# Patient Record
Sex: Female | Born: 1935 | Race: White | Hispanic: No | Smoking: Former smoker
Health system: Southern US, Community
[De-identification: ages and names within clinical notes are randomized; demographics above are authoritative.]

## PROBLEM LIST (undated history)

## (undated) DIAGNOSIS — Z8619 Personal history of other infectious and parasitic diseases: Secondary | ICD-10-CM

## (undated) DIAGNOSIS — Z889 Allergy status to unspecified drugs, medicaments and biological substances status: Secondary | ICD-10-CM

## (undated) DIAGNOSIS — Z8719 Personal history of other diseases of the digestive system: Secondary | ICD-10-CM

## (undated) DIAGNOSIS — L409 Psoriasis, unspecified: Secondary | ICD-10-CM

## (undated) DIAGNOSIS — J342 Deviated nasal septum: Secondary | ICD-10-CM

## (undated) DIAGNOSIS — L309 Dermatitis, unspecified: Secondary | ICD-10-CM

## (undated) DIAGNOSIS — E538 Deficiency of other specified B group vitamins: Secondary | ICD-10-CM

## (undated) DIAGNOSIS — M1990 Unspecified osteoarthritis, unspecified site: Secondary | ICD-10-CM

## (undated) DIAGNOSIS — K5090 Crohn's disease, unspecified, without complications: Secondary | ICD-10-CM

## (undated) DIAGNOSIS — I10 Essential (primary) hypertension: Secondary | ICD-10-CM

## (undated) DIAGNOSIS — H269 Unspecified cataract: Secondary | ICD-10-CM

## (undated) HISTORY — DX: Personal history of other infectious and parasitic diseases: Z86.19

## (undated) HISTORY — DX: Dermatitis, unspecified: L30.9

## (undated) HISTORY — PX: ABDOMINAL HYSTERECTOMY: SHX81

## (undated) HISTORY — PX: NOSE SURGERY: SHX723

## (undated) HISTORY — PX: OTHER SURGICAL HISTORY: SHX169

## (undated) HISTORY — DX: Crohn's disease, unspecified, without complications: K50.90

## (undated) HISTORY — DX: Personal history of other diseases of the digestive system: Z87.19

## (undated) HISTORY — DX: Unspecified osteoarthritis, unspecified site: M19.90

## (undated) HISTORY — DX: Essential (primary) hypertension: I10

## (undated) HISTORY — PX: EYE SURGERY: SHX253

## (undated) HISTORY — DX: Allergy status to unspecified drugs, medicaments and biological substances: Z88.9

## (undated) HISTORY — DX: Deficiency of other specified B group vitamins: E53.8

## (undated) HISTORY — DX: Unspecified cataract: H26.9

## (undated) HISTORY — PX: BACK SURGERY: SHX140

## (undated) HISTORY — PX: MOHS SURGERY: SHX181

## (undated) HISTORY — DX: Psoriasis, unspecified: L40.9

## (undated) HISTORY — PX: CATARACT EXTRACTION: SUR2

## (undated) HISTORY — PX: CHOLECYSTECTOMY: SHX55

## (undated) HISTORY — PX: NASAL SEPTUM SURGERY: SHX37

## (undated) HISTORY — DX: Deviated nasal septum: J34.2

---

## 1998-06-03 ENCOUNTER — Emergency Department (HOSPITAL_COMMUNITY): Admission: EM | Admit: 1998-06-03 | Discharge: 1998-06-03 | Payer: Self-pay | Admitting: Emergency Medicine

## 1998-08-02 ENCOUNTER — Encounter: Payer: Self-pay | Admitting: General Surgery

## 1998-08-06 ENCOUNTER — Ambulatory Visit (HOSPITAL_COMMUNITY): Admission: RE | Admit: 1998-08-06 | Discharge: 1998-08-06 | Payer: Self-pay | Admitting: General Surgery

## 1999-01-08 ENCOUNTER — Ambulatory Visit (HOSPITAL_BASED_OUTPATIENT_CLINIC_OR_DEPARTMENT_OTHER): Admission: RE | Admit: 1999-01-08 | Discharge: 1999-01-08 | Payer: Self-pay | Admitting: Plastic Surgery

## 1999-03-06 ENCOUNTER — Ambulatory Visit (HOSPITAL_BASED_OUTPATIENT_CLINIC_OR_DEPARTMENT_OTHER): Admission: RE | Admit: 1999-03-06 | Discharge: 1999-03-06 | Payer: Self-pay | Admitting: General Surgery

## 2000-03-03 ENCOUNTER — Other Ambulatory Visit: Admission: RE | Admit: 2000-03-03 | Discharge: 2000-03-03 | Payer: Self-pay | Admitting: Gastroenterology

## 2000-03-03 ENCOUNTER — Encounter (INDEPENDENT_AMBULATORY_CARE_PROVIDER_SITE_OTHER): Payer: Self-pay

## 2001-09-09 ENCOUNTER — Ambulatory Visit (HOSPITAL_BASED_OUTPATIENT_CLINIC_OR_DEPARTMENT_OTHER): Admission: RE | Admit: 2001-09-09 | Discharge: 2001-09-09 | Payer: Self-pay | Admitting: Plastic Surgery

## 2001-09-09 ENCOUNTER — Encounter (INDEPENDENT_AMBULATORY_CARE_PROVIDER_SITE_OTHER): Payer: Self-pay | Admitting: Specialist

## 2002-06-20 ENCOUNTER — Encounter: Payer: Self-pay | Admitting: Neurosurgery

## 2002-06-22 ENCOUNTER — Inpatient Hospital Stay (HOSPITAL_COMMUNITY): Admission: RE | Admit: 2002-06-22 | Discharge: 2002-06-24 | Payer: Self-pay | Admitting: Neurosurgery

## 2002-06-22 ENCOUNTER — Encounter (INDEPENDENT_AMBULATORY_CARE_PROVIDER_SITE_OTHER): Payer: Self-pay | Admitting: Specialist

## 2002-06-22 ENCOUNTER — Encounter: Payer: Self-pay | Admitting: Neurosurgery

## 2002-09-29 HISTORY — PX: NECK SURGERY: SHX720

## 2004-07-08 ENCOUNTER — Ambulatory Visit: Payer: Self-pay | Admitting: Family Medicine

## 2004-12-30 ENCOUNTER — Ambulatory Visit: Payer: Self-pay | Admitting: Family Medicine

## 2005-02-20 ENCOUNTER — Ambulatory Visit: Payer: Self-pay | Admitting: Gastroenterology

## 2005-03-11 ENCOUNTER — Ambulatory Visit: Payer: Self-pay | Admitting: Gastroenterology

## 2005-03-26 ENCOUNTER — Ambulatory Visit: Payer: Self-pay | Admitting: Gastroenterology

## 2005-06-17 ENCOUNTER — Ambulatory Visit: Payer: Self-pay | Admitting: Family Medicine

## 2005-10-27 ENCOUNTER — Ambulatory Visit: Payer: Self-pay | Admitting: Gastroenterology

## 2005-11-07 ENCOUNTER — Ambulatory Visit: Payer: Self-pay | Admitting: Gastroenterology

## 2005-11-10 ENCOUNTER — Ambulatory Visit: Payer: Self-pay | Admitting: Gastroenterology

## 2006-02-18 ENCOUNTER — Emergency Department: Payer: Self-pay | Admitting: Emergency Medicine

## 2006-02-18 ENCOUNTER — Other Ambulatory Visit: Payer: Self-pay

## 2006-04-17 ENCOUNTER — Ambulatory Visit: Payer: Self-pay | Admitting: Gastroenterology

## 2006-06-19 ENCOUNTER — Ambulatory Visit: Payer: Self-pay | Admitting: Family Medicine

## 2007-04-30 ENCOUNTER — Ambulatory Visit: Payer: Self-pay | Admitting: Gastroenterology

## 2007-04-30 LAB — CONVERTED CEMR LAB
ALT: 25 units/L (ref 0–35)
AST: 22 units/L (ref 0–37)
Albumin: 4.1 g/dL (ref 3.5–5.2)
Alkaline Phosphatase: 57 units/L (ref 39–117)
BUN: 22 mg/dL (ref 6–23)
Basophils Absolute: 0 10*3/uL (ref 0.0–0.1)
Basophils Relative: 0.5 % (ref 0.0–1.0)
Bilirubin, Direct: 0.1 mg/dL (ref 0.0–0.3)
CO2: 34 meq/L — ABNORMAL HIGH (ref 19–32)
Calcium: 9.5 mg/dL (ref 8.4–10.5)
Chloride: 93 meq/L — ABNORMAL LOW (ref 96–112)
Creatinine, Ser: 0.9 mg/dL (ref 0.4–1.2)
Eosinophils Absolute: 0.2 10*3/uL (ref 0.0–0.6)
Eosinophils Relative: 2.8 % (ref 0.0–5.0)
GFR calc Af Amer: 77 mL/min
GFR calc non Af Amer: 64 mL/min
Glucose, Bld: 103 mg/dL — ABNORMAL HIGH (ref 70–99)
HCT: 40.2 % (ref 36.0–46.0)
Hemoglobin: 14.1 g/dL (ref 12.0–15.0)
Lymphocytes Relative: 20.7 % (ref 12.0–46.0)
MCHC: 35 g/dL (ref 30.0–36.0)
MCV: 91.9 fL (ref 78.0–100.0)
Monocytes Absolute: 0.8 10*3/uL — ABNORMAL HIGH (ref 0.2–0.7)
Monocytes Relative: 14.5 % — ABNORMAL HIGH (ref 3.0–11.0)
Neutro Abs: 3.3 10*3/uL (ref 1.4–7.7)
Neutrophils Relative %: 61.5 % (ref 43.0–77.0)
Platelets: 256 10*3/uL (ref 150–400)
Potassium: 4.3 meq/L (ref 3.5–5.1)
RBC: 4.38 M/uL (ref 3.87–5.11)
RDW: 12.7 % (ref 11.5–14.6)
Sodium: 132 meq/L — ABNORMAL LOW (ref 135–145)
Total Bilirubin: 1.1 mg/dL (ref 0.3–1.2)
Total Protein: 7.3 g/dL (ref 6.0–8.3)
WBC: 5.4 10*3/uL (ref 4.5–10.5)

## 2007-06-11 ENCOUNTER — Encounter: Payer: Self-pay | Admitting: Gastroenterology

## 2007-06-11 ENCOUNTER — Ambulatory Visit: Payer: Self-pay | Admitting: Gastroenterology

## 2007-08-03 ENCOUNTER — Ambulatory Visit: Payer: Self-pay | Admitting: Gastroenterology

## 2007-08-03 LAB — CONVERTED CEMR LAB
ALT: 23 units/L (ref 0–35)
AST: 20 units/L (ref 0–37)
Albumin: 4 g/dL (ref 3.5–5.2)
Alkaline Phosphatase: 53 units/L (ref 39–117)
BUN: 15 mg/dL (ref 6–23)
Basophils Absolute: 0.1 10*3/uL (ref 0.0–0.1)
Basophils Relative: 1.1 % — ABNORMAL HIGH (ref 0.0–1.0)
Bilirubin, Direct: 0.1 mg/dL (ref 0.0–0.3)
CO2: 31 meq/L (ref 19–32)
Calcium: 9.6 mg/dL (ref 8.4–10.5)
Chloride: 89 meq/L — ABNORMAL LOW (ref 96–112)
Creatinine, Ser: 0.8 mg/dL (ref 0.4–1.2)
Eosinophils Absolute: 0.1 10*3/uL (ref 0.0–0.6)
Eosinophils Relative: 2.1 % (ref 0.0–5.0)
GFR calc Af Amer: 88 mL/min
GFR calc non Af Amer: 73 mL/min
Glucose, Bld: 122 mg/dL — ABNORMAL HIGH (ref 70–99)
HCT: 41.7 % (ref 36.0–46.0)
Hemoglobin: 14.5 g/dL (ref 12.0–15.0)
Lymphocytes Relative: 14.8 % (ref 12.0–46.0)
MCHC: 34.8 g/dL (ref 30.0–36.0)
MCV: 94.8 fL (ref 78.0–100.0)
Monocytes Absolute: 0.8 10*3/uL — ABNORMAL HIGH (ref 0.2–0.7)
Monocytes Relative: 13.5 % — ABNORMAL HIGH (ref 3.0–11.0)
Neutro Abs: 3.9 10*3/uL (ref 1.4–7.7)
Neutrophils Relative %: 68.5 % (ref 43.0–77.0)
Platelets: 143 10*3/uL — ABNORMAL LOW (ref 150–400)
Potassium: 4.1 meq/L (ref 3.5–5.1)
RBC: 4.4 M/uL (ref 3.87–5.11)
RDW: 12.7 % (ref 11.5–14.6)
Sodium: 127 meq/L — ABNORMAL LOW (ref 135–145)
Total Bilirubin: 0.9 mg/dL (ref 0.3–1.2)
Total Protein: 7.5 g/dL (ref 6.0–8.3)
WBC: 5.8 10*3/uL (ref 4.5–10.5)

## 2007-08-23 ENCOUNTER — Inpatient Hospital Stay: Payer: Self-pay | Admitting: Specialist

## 2007-08-23 ENCOUNTER — Other Ambulatory Visit: Payer: Self-pay

## 2007-09-15 ENCOUNTER — Ambulatory Visit: Payer: Self-pay | Admitting: Family Medicine

## 2007-10-27 ENCOUNTER — Ambulatory Visit: Payer: Self-pay | Admitting: Gastroenterology

## 2007-10-27 LAB — CONVERTED CEMR LAB
ALT: 63 units/L — ABNORMAL HIGH (ref 0–35)
AST: 34 units/L (ref 0–37)
Albumin: 3.6 g/dL (ref 3.5–5.2)
Alkaline Phosphatase: 65 units/L (ref 39–117)
BUN: 14 mg/dL (ref 6–23)
Basophils Absolute: 0 10*3/uL (ref 0.0–0.1)
Basophils Relative: 0.7 % (ref 0.0–1.0)
Bilirubin, Direct: 0.2 mg/dL (ref 0.0–0.3)
CO2: 32 meq/L (ref 19–32)
Calcium: 9.2 mg/dL (ref 8.4–10.5)
Chloride: 97 meq/L (ref 96–112)
Creatinine, Ser: 0.6 mg/dL (ref 0.4–1.2)
Eosinophils Absolute: 0.2 10*3/uL (ref 0.0–0.6)
Eosinophils Relative: 4.1 % (ref 0.0–5.0)
GFR calc Af Amer: 123 mL/min
GFR calc non Af Amer: 102 mL/min
Glucose, Bld: 150 mg/dL — ABNORMAL HIGH (ref 70–99)
HCT: 39.9 % (ref 36.0–46.0)
Hemoglobin: 13.6 g/dL (ref 12.0–15.0)
Lymphocytes Relative: 18.2 % (ref 12.0–46.0)
MCHC: 34.2 g/dL (ref 30.0–36.0)
MCV: 93.2 fL (ref 78.0–100.0)
Monocytes Absolute: 0.5 10*3/uL (ref 0.2–0.7)
Monocytes Relative: 13 % — ABNORMAL HIGH (ref 3.0–11.0)
Neutro Abs: 2.7 10*3/uL (ref 1.4–7.7)
Neutrophils Relative %: 64 % (ref 43.0–77.0)
Platelets: 207 10*3/uL (ref 150–400)
Potassium: 3.7 meq/L (ref 3.5–5.1)
RBC: 4.28 M/uL (ref 3.87–5.11)
RDW: 12.3 % (ref 11.5–14.6)
Sodium: 136 meq/L (ref 135–145)
Total Bilirubin: 1.1 mg/dL (ref 0.3–1.2)
Total Protein: 7 g/dL (ref 6.0–8.3)
WBC: 4.2 10*3/uL — ABNORMAL LOW (ref 4.5–10.5)

## 2007-11-22 DIAGNOSIS — I10 Essential (primary) hypertension: Secondary | ICD-10-CM

## 2007-11-22 DIAGNOSIS — D126 Benign neoplasm of colon, unspecified: Secondary | ICD-10-CM

## 2007-11-22 DIAGNOSIS — K5732 Diverticulitis of large intestine without perforation or abscess without bleeding: Secondary | ICD-10-CM

## 2007-11-22 DIAGNOSIS — M159 Polyosteoarthritis, unspecified: Secondary | ICD-10-CM | POA: Insufficient documentation

## 2007-11-22 DIAGNOSIS — K5090 Crohn's disease, unspecified, without complications: Secondary | ICD-10-CM

## 2007-11-22 DIAGNOSIS — Z85828 Personal history of other malignant neoplasm of skin: Secondary | ICD-10-CM

## 2007-12-02 ENCOUNTER — Ambulatory Visit: Payer: Self-pay | Admitting: Gastroenterology

## 2007-12-02 LAB — CONVERTED CEMR LAB
Basophils Absolute: 0 10*3/uL (ref 0.0–0.1)
Basophils Relative: 0.5 % (ref 0.0–1.0)
Eosinophils Absolute: 0.2 10*3/uL (ref 0.0–0.6)
Eosinophils Relative: 2.7 % (ref 0.0–5.0)
HCT: 41.6 % (ref 36.0–46.0)
Hemoglobin: 14 g/dL (ref 12.0–15.0)
Lymphocytes Relative: 17.2 % (ref 12.0–46.0)
MCHC: 33.8 g/dL (ref 30.0–36.0)
MCV: 93.4 fL (ref 78.0–100.0)
Monocytes Absolute: 0.6 10*3/uL (ref 0.2–0.7)
Monocytes Relative: 9.4 % (ref 3.0–11.0)
Neutro Abs: 4.3 10*3/uL (ref 1.4–7.7)
Neutrophils Relative %: 70.2 % (ref 43.0–77.0)
Platelets: 211 10*3/uL (ref 150–400)
RBC: 4.45 M/uL (ref 3.87–5.11)
RDW: 12.6 % (ref 11.5–14.6)
WBC: 6.1 10*3/uL (ref 4.5–10.5)

## 2008-03-14 ENCOUNTER — Telehealth: Payer: Self-pay | Admitting: Gastroenterology

## 2008-03-24 ENCOUNTER — Ambulatory Visit: Payer: Self-pay | Admitting: Gastroenterology

## 2008-03-24 DIAGNOSIS — K644 Residual hemorrhoidal skin tags: Secondary | ICD-10-CM | POA: Insufficient documentation

## 2008-03-29 ENCOUNTER — Encounter: Payer: Self-pay | Admitting: Gastroenterology

## 2008-03-29 LAB — CONVERTED CEMR LAB
Basophils Absolute: 0.1 10*3/uL (ref 0.0–0.1)
Basophils Relative: 1.3 % — ABNORMAL HIGH (ref 0.0–1.0)
Eosinophils Absolute: 0.1 10*3/uL (ref 0.0–0.7)
Eosinophils Relative: 2.4 % (ref 0.0–5.0)
HCT: 42.7 % (ref 36.0–46.0)
Hemoglobin: 14.9 g/dL (ref 12.0–15.0)
Lymphocytes Relative: 15.3 % (ref 12.0–46.0)
MCHC: 34.8 g/dL (ref 30.0–36.0)
MCV: 93.7 fL (ref 78.0–100.0)
Monocytes Absolute: 0.6 10*3/uL (ref 0.1–1.0)
Monocytes Relative: 11.8 % (ref 3.0–12.0)
Neutro Abs: 3.5 10*3/uL (ref 1.4–7.7)
Neutrophils Relative %: 69.2 % (ref 43.0–77.0)
Platelets: 196 10*3/uL (ref 150–400)
RBC: 4.56 M/uL (ref 3.87–5.11)
RDW: 12.7 % (ref 11.5–14.6)
WBC: 5.1 10*3/uL (ref 4.5–10.5)

## 2008-04-03 ENCOUNTER — Telehealth: Payer: Self-pay | Admitting: Gastroenterology

## 2008-04-17 ENCOUNTER — Ambulatory Visit: Payer: Self-pay | Admitting: Gastroenterology

## 2008-07-19 ENCOUNTER — Ambulatory Visit: Payer: Self-pay | Admitting: Gastroenterology

## 2008-07-20 LAB — CONVERTED CEMR LAB
Basophils Relative: 0.1 % (ref 0.0–3.0)
Eosinophils Relative: 6.3 % — ABNORMAL HIGH (ref 0.0–5.0)
Hemoglobin: 14.8 g/dL (ref 12.0–15.0)
Lymphocytes Relative: 19.7 % (ref 12.0–46.0)
Neutrophils Relative %: 63.6 % (ref 43.0–77.0)
RBC: 4.51 M/uL (ref 3.87–5.11)
WBC: 5.2 10*3/uL (ref 4.5–10.5)

## 2008-10-05 ENCOUNTER — Ambulatory Visit: Payer: Self-pay | Admitting: Family Medicine

## 2008-10-25 ENCOUNTER — Ambulatory Visit: Payer: Self-pay | Admitting: Gastroenterology

## 2008-10-26 LAB — CONVERTED CEMR LAB
Albumin: 3.7 g/dL (ref 3.5–5.2)
Alkaline Phosphatase: 51 units/L (ref 39–117)
CO2: 35 meq/L — ABNORMAL HIGH (ref 19–32)
Calcium: 9.1 mg/dL (ref 8.4–10.5)
Chloride: 99 meq/L (ref 96–112)
GFR calc Af Amer: 88 mL/min
GFR calc non Af Amer: 73 mL/min
Glucose, Bld: 115 mg/dL — ABNORMAL HIGH (ref 70–99)
Lymphocytes Relative: 22.6 % (ref 12.0–46.0)
Monocytes Relative: 11.1 % (ref 3.0–12.0)
Neutrophils Relative %: 63.3 % (ref 43.0–77.0)
Platelets: 191 10*3/uL (ref 150–400)
Potassium: 3.8 meq/L (ref 3.5–5.1)
RDW: 13 % (ref 11.5–14.6)
Sodium: 139 meq/L (ref 135–145)
Total Protein: 7.1 g/dL (ref 6.0–8.3)
WBC: 5.3 10*3/uL (ref 4.5–10.5)

## 2008-11-10 ENCOUNTER — Ambulatory Visit: Payer: Self-pay | Admitting: Gastroenterology

## 2009-01-18 ENCOUNTER — Ambulatory Visit: Payer: Self-pay | Admitting: Gastroenterology

## 2009-01-22 LAB — CONVERTED CEMR LAB
Albumin: 3.7 g/dL (ref 3.5–5.2)
Alkaline Phosphatase: 59 units/L (ref 39–117)
BUN: 15 mg/dL (ref 6–23)
Calcium: 9 mg/dL (ref 8.4–10.5)
Glucose, Bld: 153 mg/dL — ABNORMAL HIGH (ref 70–99)
HCT: 42.6 % (ref 36.0–46.0)
Monocytes Relative: 11.1 % (ref 3.0–12.0)
Platelets: 268 10*3/uL (ref 150.0–400.0)
Potassium: 3.5 meq/L (ref 3.5–5.1)
RDW: 12.6 % (ref 11.5–14.6)

## 2009-11-08 ENCOUNTER — Ambulatory Visit: Payer: Self-pay | Admitting: Family Medicine

## 2009-12-13 ENCOUNTER — Ambulatory Visit: Payer: Self-pay | Admitting: Ophthalmology

## 2009-12-26 ENCOUNTER — Ambulatory Visit: Payer: Self-pay | Admitting: Ophthalmology

## 2010-04-29 ENCOUNTER — Ambulatory Visit: Payer: Self-pay | Admitting: Gastroenterology

## 2010-04-30 LAB — CONVERTED CEMR LAB
Albumin: 3.8 g/dL (ref 3.5–5.2)
BUN: 16 mg/dL (ref 6–23)
Basophils Relative: 1.1 % (ref 0.0–3.0)
Calcium: 9 mg/dL (ref 8.4–10.5)
Chloride: 98 meq/L (ref 96–112)
Eosinophils Relative: 3.9 % (ref 0.0–5.0)
Glucose, Bld: 120 mg/dL — ABNORMAL HIGH (ref 70–99)
Lymphocytes Relative: 16.9 % (ref 12.0–46.0)
Neutrophils Relative %: 67.2 % (ref 43.0–77.0)
Potassium: 4.3 meq/L (ref 3.5–5.1)
RBC: 4.1 M/uL (ref 3.87–5.11)
WBC: 4.8 10*3/uL (ref 4.5–10.5)

## 2010-05-03 ENCOUNTER — Ambulatory Visit: Payer: Self-pay | Admitting: Gastroenterology

## 2010-05-06 ENCOUNTER — Encounter: Payer: Self-pay | Admitting: Gastroenterology

## 2010-05-07 ENCOUNTER — Encounter: Payer: Self-pay | Admitting: Gastroenterology

## 2010-05-08 ENCOUNTER — Encounter: Payer: Self-pay | Admitting: Gastroenterology

## 2010-05-22 ENCOUNTER — Telehealth: Payer: Self-pay | Admitting: Gastroenterology

## 2010-05-27 ENCOUNTER — Ambulatory Visit: Payer: Self-pay | Admitting: Gastroenterology

## 2010-05-29 LAB — CONVERTED CEMR LAB
AST: 41 units/L — ABNORMAL HIGH (ref 0–37)
Albumin: 3.7 g/dL (ref 3.5–5.2)
Alkaline Phosphatase: 94 units/L (ref 39–117)
BUN: 16 mg/dL (ref 6–23)
Basophils Relative: 0.9 % (ref 0.0–3.0)
Eosinophils Absolute: 0.2 10*3/uL (ref 0.0–0.7)
HCT: 40.8 % (ref 36.0–46.0)
Lymphs Abs: 1 10*3/uL (ref 0.7–4.0)
MCHC: 34.6 g/dL (ref 30.0–36.0)
MCV: 97.2 fL (ref 78.0–100.0)
Monocytes Absolute: 0.6 10*3/uL (ref 0.1–1.0)
Neutrophils Relative %: 65.1 % (ref 43.0–77.0)
Potassium: 4.1 meq/L (ref 3.5–5.1)
RBC: 4.19 M/uL (ref 3.87–5.11)
Sed Rate: 23 mm/hr — ABNORMAL HIGH (ref 0–22)
Sodium: 141 meq/L (ref 135–145)
Total Protein: 6.8 g/dL (ref 6.0–8.3)

## 2010-06-12 ENCOUNTER — Ambulatory Visit: Payer: Self-pay | Admitting: Gastroenterology

## 2010-06-12 LAB — CONVERTED CEMR LAB
BUN: 18 mg/dL (ref 6–23)
CO2: 33 meq/L — ABNORMAL HIGH (ref 19–32)
Calcium: 9.6 mg/dL (ref 8.4–10.5)
Creatinine, Ser: 0.6 mg/dL (ref 0.4–1.2)
GFR calc non Af Amer: 105.05 mL/min (ref >60)
Glucose, Bld: 89 mg/dL (ref 70–99)
Total Bilirubin: 1 mg/dL (ref 0.3–1.2)

## 2010-06-25 ENCOUNTER — Telehealth: Payer: Self-pay | Admitting: Gastroenterology

## 2010-07-09 ENCOUNTER — Telehealth: Payer: Self-pay | Admitting: Gastroenterology

## 2010-07-22 ENCOUNTER — Ambulatory Visit: Payer: Self-pay | Admitting: Gastroenterology

## 2010-10-29 ENCOUNTER — Ambulatory Visit
Admission: RE | Admit: 2010-10-29 | Discharge: 2010-10-29 | Payer: Self-pay | Source: Home / Self Care | Attending: Gastroenterology | Admitting: Gastroenterology

## 2010-10-29 ENCOUNTER — Other Ambulatory Visit: Payer: Self-pay | Admitting: Gastroenterology

## 2010-10-29 LAB — COMPREHENSIVE METABOLIC PANEL
ALT: 108 U/L — ABNORMAL HIGH (ref 0–35)
Albumin: 4 g/dL (ref 3.5–5.2)
CO2: 34 mEq/L — ABNORMAL HIGH (ref 19–32)
Chloride: 97 mEq/L (ref 96–112)
GFR: 53.5 mL/min — ABNORMAL LOW (ref >60.00)
Glucose, Bld: 123 mg/dL — ABNORMAL HIGH (ref 70–99)
Potassium: 4.3 mEq/L (ref 3.5–5.1)
Sodium: 138 mEq/L (ref 135–145)
Total Bilirubin: 0.7 mg/dL (ref 0.3–1.2)
Total Protein: 7.2 g/dL (ref 6.0–8.3)

## 2010-10-29 LAB — CBC WITH DIFFERENTIAL/PLATELET
Basophils Absolute: 0 10*3/uL (ref 0.0–0.1)
Eosinophils Relative: 2.4 % (ref 0.0–5.0)
HCT: 43.5 % (ref 36.0–46.0)
Lymphocytes Relative: 13.3 % (ref 12.0–46.0)
Lymphs Abs: 0.8 10*3/uL (ref 0.7–4.0)
Monocytes Relative: 10.5 % (ref 3.0–12.0)
Neutrophils Relative %: 73.4 % (ref 43.0–77.0)
Platelets: 194 10*3/uL (ref 150.0–400.0)
RDW: 13.2 % (ref 11.5–14.6)
WBC: 7.2 10*3/uL (ref 4.5–10.5)

## 2010-10-29 NOTE — Progress Notes (Signed)
Summary: condt. update  Phone Note Call from Patient Call back at Home Phone [phone]   Caller: Patient Call For: Dr Ardis Hughs Reason for Call: Talk to Nurse Summary of Call: Patient wants to speak to nurse regarding condt. uodate. Initial call taken by: Ronalee Red,  June 25, 2010 1:46 PM  Follow-up for Phone Call        pt calling to reprt that she is feeling better and the Entocort and the gas ex is helping a great deal.  She will call if her symptoms worsen or she needs [place] for anything. Follow-up by: Christian Mate CMA Deborra Medina),  June 25, 2010 2:02 PM

## 2010-10-29 NOTE — Progress Notes (Signed)
Summary: med concern  Phone Note Call from Patient Call back at Home Phone [phone]   Caller: Patient Call For: Dr. Ardis Hughs Reason for Call: Talk to Nurse Summary of Call: would like a cheaper med than the generic Entocort... [facility] on [address] in [place] Initial call taken by: Lucien Mons,  July 09, 2010 10:41 AM  Follow-up for Phone Call        left message on machine to call back [place] ([place])  July 09, 2010 1:29 PM   Pt can not afford to take the Entocort it is $500 she only has 5 left.  Is there anything else she can take?  The drug company will not help because she has medicare. Follow-up by: Christian Mate CMA Deborra Medina),  July 09, 2010 2:14 PM  Additional Follow-up for Phone Call Additional follow up Details #1::        ok, lets try low dose prednisone.  have her take 29m pills, one pill, once daily.  disp 60 with one refill.  Additional Follow-up by: DMilus BanisterMD,  July 10, 2010 7:46 AM    Additional Follow-up for Phone Call Additional follow up Details #2::    pt aware med sent to pharmacy Follow-up by: PChristian MateCMA (Deborra Medina,  July 10, 2010 8:55 AM  New/Updated Medications: PREDNISONE 10 MG TABS (PREDNISONE) 1 by mouth once daily Prescriptions: PREDNISONE 10 MG TABS (PREDNISONE) 1 by mouth once daily  #60 x 1   Entered by:   PChristian MateCMA (AMadera   Authorized by:   DMilus BanisterMD   Signed by:   PChristian MateCMA (ARoy on 07/10/2010   Method used:   Electronically to        [facility])       [address]      [place]  [address]      Ph: [phone]      Fax: [phone]  RxID:   [id number]

## 2010-10-29 NOTE — Progress Notes (Signed)
Summary: Triage  Phone Note Call from Patient Call back at Home Phone [phone]   Caller: Patient Call For: Dr. Ardis Hughs Reason for Call: Talk to Nurse Summary of Call: Having a change in her BM....extreme diarrhea Initial call taken by: Webb Laws,  May 22, 2010 9:15 AM  Follow-up for Phone Call        pt is calling because her diarrhea has not increased but is looser.  She has not tried Immodium.  She has no other symptoms, no rectal bleeding, fever or abd pain just very loose stools several times per day.  I advised her to take her Immodium and I call back if this does not help.  She does have a f/u appt with Dr Ardis Hughs in a few weeks  and will keep that.  I will forward to Dr Ardis Hughs for any further orders. Follow-up by: Christian Mate CMA Deborra Medina),  May 22, 2010 9:52 AM  Additional Follow-up for Phone Call Additional follow up Details #1::        i agree, can you have her come in in next few days for the labs she was going to have done prior to her next rov (cbc, cmet) Additional Follow-up by: Milus Banister MD,  May 22, 2010 12:45 PM    Additional Follow-up for Phone Call Additional follow up Details #2::    pt aware Follow-up by: Christian Mate CMA Deborra Medina),  May 22, 2010 1:30 PM

## 2010-10-29 NOTE — Assessment & Plan Note (Signed)
GI PROBLEM LIST: 1. Crohn's disease, status post 1988 ileocecostomy.  Long term patient of Dr. Inocente Salles [place]'s. Crohn's colitis based on review of records.  Maintained recently on 6MP 75 a day, Asacol 1.2 g a day.  Colonoscopy September 2008 by me was normal.  No colitis.  No ileitis.  Flare of diarrhea starting August, 2011, increased mercaptopurine to 100 mg a day however back down when liver tests bumped significantly. 3. External hemorrhoids, June 2009, causing anal rectal discomfort. Improved with conservative therapies.   History of Present Illness Visit Type: Follow-up Visit Primary GI MD: Owens Loffler MD Primary Provider: Maryland Pink, MD Requesting Provider: na Chief Complaint: four week follow up  History of Present Illness:     very pleasant 77 year old woman whom I last saw about 5 weeks ago. She was having loose stools, felt possibly to be a Crohn's flare. I increased her mercaptopurine up to 100 mg a day however had to back down to 7m a day of 636mdo to increasing liver tests..  Her loose stools continue, has been on immodium (up to 5/day).  multiple stool tests done in the interim has shown no sign of infection. She is having 4-7 loose stools a day, intermittently getting up at night, some urgency as well.           Current Medications (verified): 1)  Caltrate 600+d 600-400 Mg-Unit  Tabs (Calcium Carbonate-Vitamin D) .... Once Daily 2)  Mercaptopurine 50 Mg  Tabs (Mercaptopurine) ...Marland Kitchen 1 1/2 By Mouth Daily 3)  Metoprolol Succinate 50 Mg  Tb24 (Metoprolol Succinate) .... Two Tablets By Mouth Once Daily 4)  Multivitamins   Tabs (Multiple Vitamin) .... Once Daily 5)  Diphenoxylate-Atropine 2.5-0.025 Mg  Tabs (Diphenoxylate-Atropine) .... Take 2 Tab Four Times A Day As Needed 6)  Twynsta 80-5 Mg Tabs (Telmisartan-Amlodipine) .... One Tablet By Mouth Once Daily 7)  Omega-3 350 Mg Caps (Omega-3 Fatty Acids) .... One Tablet By Mouth Two Times A Day  Allergies (verified): 1)   ! Nsaids 2)  ! Sulfa  Vital Signs:  Patient profile:   [age]ear old female Height:      64 inches Weight:      127 pounds BMI:     21.88 BSA:     1.61 Pulse rate:   76 / minute Pulse rhythm:   regular BP sitting:   142 / 86  (left arm) Cuff size:   regular  Vitals Entered By: KeHope PigeonMA (June 12, 2010 1:55 PM)  Physical Exam  Additional Exam:  Constitutional: generally well appearing Psychiatric: alert and oriented times 3 Abdomen: soft, non-tender, non-distended, normal bowel sounds    Impression & Recommendations:  Problem # 1:  Diarrhea, loose stools likely still this is a Crohn's flare. I will start her on 3 pills of Entocort once daily which she'll take for the next month and she'll return to see me at that time. She had labs drawn just prior to this appointment which we will review. Hopefully her liver tests are improving. She knows to call sooner if her diarrhea is not improving or if it worsens.  Patient Instructions: 1)  Will start entocort (take 3 pills, once every morning). Stay on this dose until you see Dr. JaArdis Hughsn 4 weeks. 2)  Return to see Dr. JaArdis Hughsn 4 weeks. 3)  The medication list was reviewed and reconciled.  All changed / newly prescribed medications were explained.  A complete medication list was provided to the patient /  caregiver. Prescriptions: ENTOCORT EC 3 MG  CP24 (BUDESONIDE) Take 3 capsules daily  #90 x 2   Entered and Authorized by:   Sabrina Banister MD   Signed by:   Sabrina Banister MD on 06/12/2010   Method used:   Electronically to        [id number] (retail)       [address] [place], [place]  [address]       Ph: [phone]       Fax: [phone]   RxID:   [id number]   Appended Document:  please call, liver tests much improved.  Should continue with the suggestions outlined at recent visit.  Appended Document:  pt aware

## 2010-10-29 NOTE — Assessment & Plan Note (Signed)
Summary: 57mfu/// em  GI PROBLEM LIST: 1. Crohn's disease, status post 1988 ileocecostomy.  Long term patient of [facility]. Crohn's colitis based on review of records.  Maintained recently on 6MP 75 a day, Asacol 1.2 g a day.  Colonoscopy September 2008 by me was normal.  No colitis.  No ileitis. 3. External hemorrhoids, June 2009, causing anal rectal discomfort. Improved with conservative therapies.   History of Present Illness Primary GI MD: DOwens LofflerMD Primary Provider: JMaryland Pink MD Chief Complaint: 6 month f/u Crohn's disease, pt has some diarrhea for the last several months.  History of Present Illness:     very pleasant 77year old woman who I last saw 6 months ago.  She had a CBC, complete metabolic profile checked 2 days ago and this showed her CBC was normal, she had slightly elevated single transaminase.  Lately she has had more loose stools than usual.  She takes lomotil usually two pills a day, sometimes more.  Will also take an immodium periodically.  She would go usually 8-10 a day without lomotil/immodium.  [place] in [place] was visiting and broke her hip and so needed to stay here for 5 weeks instead of the usual 1 week.    Two neighbors have died recently.  No antibiotics in past few months.  Sometimes nocturnal symptoms.           Current Medications (verified): 1)  Caltrate 600+d 600-400 Mg-Unit  Tabs (Calcium Carbonate-Vitamin D) .... Once Daily 2)  Mercaptopurine 50 Mg  Tabs (Mercaptopurine) .... Take 1 1/2 Daily 3)  Metoprolol Succinate 50 Mg  Tb24 (Metoprolol Succinate) .... Two Tablets By Mouth Once Daily 4)  Multivitamins   Tabs (Multiple Vitamin) .... Once Daily 5)  Amlodipine Besylate 5 Mg  Tabs (Amlodipine Besylate) .... Once Daily 6)  Diphenoxylate-Atropine 2.5-0.025 Mg  Tabs (Diphenoxylate-Atropine) .... Take 2 Tab Four Times A Day As Needed 7)  Clonidine Hcl 0.1 Mg Tabs (Clonidine Hcl) ..Marland Kitchen. 1 Tablet By Mouth Once  Daily 8)  Twynsta 80-5 Mg Tabs (Telmisartan-Amlodipine) .... One Tablet By Mouth Once Daily  Allergies (verified): 1)  ! Nsaids  Vital Signs:  Patient profile:   77year old female Height:      64 inches Weight:      126.25 pounds BMI:     21.75 Pulse rate:   80 / minute Pulse rhythm:   regular BP sitting:   162 / 76  (right arm) Cuff size:   regular  Vitals Entered By: AMarlon PelCMA (Deborra Medina (May 03, 2010 11:05 AM)  Physical Exam  Additional Exam:  Constitutional: generally well appearing Psychiatric: alert and oriented times 3 Abdomen: soft, non-tender, non-distended, normal bowel sounds    Impression & Recommendations:  Problem # 1:  Crohn's disease, recently looser than usual stools she has always down play her loose stools, diarrhea however now she is complaining and so I suspect she truly is having a lot of loose stools, much more than her usual. This may be a Crohn's flare. She feels that it probably is. I will increase her mercaptopurine to 100 mg a day as well as check stool tests for infectious etiology. She will return to see me in 3-4 weeks and sooner if needed.  Other Orders: T-Culture, C-Diff Toxin A/B ([id number] T-Culture, Stool ([id number]) T-Stool for O&P (([id number] T-Fecal WBC (([id number]  Patient Instructions: 1)  You will get lab test(s) done today (stool tests for c. diff, culture, ova/parasites). 2)  Increase the mercaptopurine to 2 pills a day (from 1 1/2 a day). 3)  Return to see Dr. Ardis Hughs in 3-4 weeks (cbc, cmet, esr, tsh just prior). 4)  The medication list was reviewed and reconciled.  All changed / newly prescribed medications were explained.  A complete medication list was provided to the patient / caregiver. Prescriptions: MERCAPTOPURINE 50 MG  TABS (MERCAPTOPURINE) take 2 pills daily  #60 x 4   Entered and Authorized by:   Milus Banister MD   Signed by:   Milus Banister MD on 05/03/2010   Method used:    Electronically to        [id number] (retail)       [address] [place]       [place], [place]  [address]       Ph: [phone]       Fax: [phone]   RxID:   [id number]

## 2010-10-29 NOTE — Assessment & Plan Note (Signed)
GI PROBLEM LIST: 1. Crohn's disease, status post 1988 ileocecostomy.  Long term patient of Dr. Inocente Salles [place]'s. Crohn's colitis based on review of records.  Maintained recently on 6MP 75 a day, Asacol 1.2 g a day.  Colonoscopy September 2008 by me was normal.  No colitis.  No ileitis.  Flare of diarrhea starting August, 2011, increased mercaptopurine to 100 mg a day however back down when liver tests bumped significantly.  September, 2011: decreased mercaptopurine back to 75 mg a day ( liver tests normalized) , started Entocort with good success however it was cost prohibitive and so prednisone 10 mg once daily was substituted. Very effective. October, 2011 began to taper prednisone slowly.  3. External hemorrhoids, June 2009, causing anal rectal discomfort. Improved with conservative therapies.   History of Present Illness Visit Type: Follow-up Visit Primary GI MD: Owens Loffler MD Primary Provider: Tawnya Crook, MD Requesting Provider: n/a Chief Complaint: Patient here for f/u for Crohn's Disease. History of Present Illness:     very pleasant 77 year old woman whom I last saw about 5 weeks ago. since being on steroids, the diarrhea and frequency have both improved significantly. She feels much better overall. She is currently on 10 mg once daily.           Current Medications (verified): 1)  Caltrate 600+d 600-400 Mg-Unit  Tabs (Calcium Carbonate-Vitamin D) .... Take 2 Tablets By Mouth Once Daily 2)  Mercaptopurine 50 Mg  Tabs (Mercaptopurine) .Marland Kitchen.. 1 1/2 By Mouth Daily 3)  Metoprolol Succinate 50 Mg  Tb24 (Metoprolol Succinate) .... Two Tablets By Mouth Once Daily 4)  Multivitamins   Tabs (Multiple Vitamin) .... Once Daily 5)  Diphenoxylate-Atropine 2.5-0.025 Mg  Tabs (Diphenoxylate-Atropine) .... Take 2 Tab Four Times A Day As Needed 6)  Twynsta 80-5 Mg Tabs (Telmisartan-Amlodipine) .... One Tablet By Mouth Once Daily 7)  Omega-3 350 Mg Caps (Omega-3 Fatty Acids) .... One Tablet By  Mouth Two Times A Day 8)  Prednisone 10 Mg Tabs (Prednisone) .Marland Kitchen.. 1 By Mouth Once Daily 9)  Diazepam 5 Mg Tabs (Diazepam) .... Take 1//2 Tablet By Mouth As Needed  Allergies (verified): 1)  ! Nsaids 2)  ! Sulfa  Vital Signs:  Patient profile:   77 year old female Height:      64 inches Weight:      126 pounds BMI:     21.71 BSA:     1.61 Pulse rate:   64 / minute Pulse rhythm:   regular BP sitting:   184 / 80  (left arm)  Vitals Entered By: Madlyn Frankel CMA Deborra Medina) (July 22, 2010 10:50 AM)  Physical Exam  Additional Exam:  Constitutional: generally well appearing Psychiatric: alert and oriented times 3 Abdomen: soft, non-tender, non-distended, normal bowel sounds    Impression & Recommendations:  Problem # 1:  Crohn's disease her symptoms are under very good control on prednisone 10 mg once daily. She will begin to taper this over the next several weeks and will return to see me in 2 months time. She knows to call back if her diarrhea returns.  Patient Instructions: 1)  Cut back to 69ms of prednisone once daily for then next month.  Then 522m every other day and return to see Dr. JaArdis Hughsn 2 months. 2)  Call Dr. JaArdis Hughsoffice if the diarrhea returns. 3)  The medication list was reviewed and reconciled.  All changed / newly prescribed medications were explained.  A complete medication list was provided to the patient /  caregiver.

## 2010-11-06 NOTE — Assessment & Plan Note (Signed)
GI PROBLEM LIST: 1. Crohn's disease, status post 1988 ileocecostomy.  Long term patient of Dr. Inocente Salles [place]'s. Crohn's colitis based on review of records.  Maintained recently on 6MP 75 a day, Asacol 1.2 g a day.  Colonoscopy September 2008 by me was normal.  No colitis.  No ileitis.  Flare of diarrhea starting August, 2011, increased mercaptopurine to 100 mg a day however back down when liver tests bumped significantly.  September, 2011: decreased mercaptopurine back to 75 mg a day ( liver tests normalized) , started Entocort with good success however it was cost prohibitive and so prednisone 10 mg once daily was substituted. Very effective. October, 2011 began to taper prednisone slowly.   Prednisone 5 mg every other day Was very helpful. 3. External hemorrhoids, June 2009, causing anal rectal discomfort. Improved with conservative therapies.    History of Present Illness Visit Type: Follow-up Visit Primary GI MD: Owens Loffler MD Primary Provider: Tawnya Crook, MD Requesting Provider: n/a Chief Complaint: follow-up History of Present Illness:     very pleasant 78 year old woman, I last saw her about 4 months ago. She did very well on her prednisone and as she tapered off her symptoms of loose stools, gassiness, bloating returned. She says 5 mg every other day Her symptoms under good control.           Current Medications (verified): 1)  Caltrate 600+d 600-400 Mg-Unit  Tabs (Calcium Carbonate-Vitamin D) .... Take 2 Tablets By Mouth Once Daily 2)  Mercaptopurine 50 Mg  Tabs (Mercaptopurine) .Marland Kitchen.. 1 1/2 By Mouth Daily 3)  Metoprolol Succinate 50 Mg  Tb24 (Metoprolol Succinate) .... Two Tablets By Mouth Once Daily 4)  Multivitamins   Tabs (Multiple Vitamin) .... Once Daily 5)  Diphenoxylate-Atropine 2.5-0.025 Mg  Tabs (Diphenoxylate-Atropine) .... Take 2 Tab Four Times A Day As Needed 6)  Twynsta 80-5 Mg Tabs (Telmisartan-Amlodipine) .... One Tablet By Mouth Once Daily 7)  Omega-3 350  Mg Caps (Omega-3 Fatty Acids) .... One Tablet By Mouth Two Times A Day 8)  Diazepam 5 Mg Tabs (Diazepam) .... Take 1//2 Tablet By Mouth As Needed  Allergies (verified): 1)  ! Nsaids 2)  ! Sulfa 3)  ! Lactose  Vital Signs:  Patient profile:   77 year old female Height:      64 inches Weight:      129 pounds BMI:     22.22 BP sitting:   150 / 68  (left arm)  Vitals Entered By: Randye Lobo NCMA (October 29, 2010 2:14 PM)  Physical Exam  Additional Exam:  Constitutional: generally well appearing Psychiatric: alert and oriented times 3 Abdomen: soft, non-tender, non-distended, normal bowel sounds    Impression & Recommendations:  Problem # 1:  CROHN'S DISEASE (ICD-555.9) we cannot increase her 6-MP since she has had significant liver test abnormalities as it was increased in the past. 5 mg of prednisone every other day kept her symptoms under very good control and I see no reason why she cannot maintain on that indefinitely. I have given her a new prescription of prednisone. Her insurance company does not cover Lomotil anymore and I recommended she try Imodium for as needed coverage for diarrhea. It is usually just as good. She is due for CBC and electrolyte testing to check her 6-MP toxicity. We will contact her with those results and she will return to see me in 3-4 months and sooner if needed.  Other Orders: TLB-CBC Platelet - w/Differential ([id number]) TLB-CMP (Comprehensive Metabolic Pnl) ([id number])  Patient Instructions: 1)  Restart prednisone 90m pills, take one pill every other day. 2)  New prescription written. 3)  Return to see Dr. JArdis Hughsin 3-4 months. 4)  Take immodium in place of your lomotil.  You can take up to 8 a day. 5)  You will get lab test(s) done today (cbc, cmet). 6)  The medication list was reviewed and reconciled.  All changed / newly prescribed medications were explained.  A complete medication list was provided to the patient /  caregiver. Prescriptions: PREDNISONE 5 MG  TABS (PREDNISONE) take one pill every other day  #60 x 6   Entered and Authorized by:   DMilus BanisterMD   Signed by:   DMilus BanisterMD on 10/29/2010   Method used:   Electronically to        [id number](retail)       [address] [place]      [place]  [address]      Ph: [phone]      Fax: [phone]  RxID:   [id number]

## 2010-11-26 ENCOUNTER — Ambulatory Visit: Payer: Self-pay | Admitting: Family Medicine

## 2010-11-27 ENCOUNTER — Telehealth: Payer: Self-pay | Admitting: Gastroenterology

## 2010-12-05 NOTE — Progress Notes (Signed)
Summary: Medication  Phone Note Call from Patient Call back at Home Phone [phone]   Caller: Patient Call For: dR. jACOBS Reason for Call: Talk to Nurse Summary of Call: Patient would like to have her medication called in to the  [facility] [address] in [place] Initial call taken by: Martinique Johnson,  November 27, 2010 11:55 AM    Prescriptions: MERCAPTOPURINE 50 MG  TABS (MERCAPTOPURINE) 1 1/2 by mouth daily  #45 x 6   Entered by:   Christian Mate CMA ([place])   Authorized by:   Milus Banister MD   Signed by:   Christian Mate CMA ([place]) on 11/27/2010   Method used:   Electronically to        [facility] (retail)       [place]       [place], [place]  [address]       Ph: [phone]       Fax: [phone]   RxID:   [id number]  pt aware med sent

## 2010-12-11 ENCOUNTER — Telehealth: Payer: Self-pay | Admitting: Gastroenterology

## 2010-12-17 NOTE — Progress Notes (Signed)
Summary: discuss meds  Phone Note Call from Patient Call back at Home Phone [phone]   Caller: Patient Call For: Dr Ardis Hughs Reason for Call: Talk to Nurse Summary of Call: Patient wants to discuss meds with nurse. Initial call taken by: Ronalee Red,  December 11, 2010 2:58 PM  Follow-up for Phone Call        pt has been having dizziness, decreased urine output.  I advised pt to call her PCP.  pt agreed Follow-up by: Christian Mate CMA Deborra Medina),  December 11, 2010 3:05 PM  Additional Follow-up for Phone Call Additional follow up Details #1::        i agree Additional Follow-up by: Milus Banister MD,  December 12, 2010 8:44 AM

## 2010-12-31 ENCOUNTER — Other Ambulatory Visit: Payer: Self-pay

## 2010-12-31 DIAGNOSIS — D649 Anemia, unspecified: Secondary | ICD-10-CM

## 2010-12-31 MED ORDER — PREDNISONE 5 MG PO TABS: 5.0000 mg | ORAL_TABLET | ORAL | Status: AC

## 2010-12-31 MED ORDER — PREDNISONE 5 MG PO TABS: 5.0000 mg | ORAL_TABLET | ORAL | Status: DC

## 2011-02-11 NOTE — Assessment & Plan Note (Signed)
[facility] OFFICE NOTE   NAME:ARMFIELDMahitha, Hickling                   MRN:          [id number]  DATE:08/03/2007                            DOB:          [birth year]    PRIMARY CARE PHYSICIAN:  Maryland Pink, M.D.   GI PROBLEM LIST:  1. Crohn's disease, status post 1988 ileocecostomy.  Long term patient      of Dr. Inocente Salles [place].  2. Gout.  3. Crohn's colitis based on review of records.  Maintained recently on      6MP 75 a day, Asacol 1.2 g a day.  Colonoscopy September 2008 by me      was normal.  No colitis.  No ileitis.   INTERVAL HISTORY:  I last saw [place] one month ago at the time of her  colonoscopy.  I saw no active Crohn's disease in her colon.  I could not  get into her terminal ileum due to tortuosity of her IC anastomosis but  there was no obvious inflammation at the anastomosis.  Since then she  stopped the Asacol and she started taking Cholestyramine one packet a  day.  She continues to have 3-4, sometimes five loose stools a day.  That has been her pattern for many, many years.   CURRENT MEDICATIONS:  1. 6MP 75 mg a day.  2. Diazepam.  3. Triamterene/hydrochlorothiazide.  4. Benicar.  5. Caltrate.  6. Toprol.   PHYSICAL EXAMINATION:  Weight 130 pounds.  She is down three pounds  since her last visit.  Blood pressure 130/80, pulse 80.  CONSTITUTIONAL:  Generally well-appearing.  ABDOMEN:  Soft, nontender, nondistended.  Normal bowel sounds.   ASSESSMENT/PLAN:  An 77 year old woman with a long history of Crohn's  disease, no colitis, no IC anastomosis.  Inflammation on recent  colonoscopy.   She still has 4-5 loose stools a day and there is room to increase her  6MP based on her counts.  She is not so interested in making any  medication changes.  She says she has been going 4-5 times a day for  many years and she is just used to it and does not want to make any  major adjustments.  I  offered to increase her Cholestyramine and she is  not interested in that.  I think that is reasonable.  She is not  bothered by her loose stools on a daily basis.  She will, however,  return to see me in three months' time.  Today we will get a set of labs  including a CBC and a complete metabolic profile to make sure her counts  are handling her chronic 6MP.     Milus Banister, MD  Electronically Signed    DPJ/MedQ  DD: 08/03/2007  DT: 08/04/2007  Job #: [id number]   cc:   Maryland Pink

## 2011-02-11 NOTE — Assessment & Plan Note (Signed)
[facility] OFFICE NOTE   NAME:Sabrina Hall                   MRN:          [id number]  DATE:10/27/2007                            DOB:          Jul 20, 1923    PRIMARY CARE PHYSICIAN:  Dr. Maryland Pink   GI PROBLEM LIST:  1. Crohn's disease, status post 1988 ileocecostomy.  Long term patient      of Dr. Inocente Salles [place].  2. Gout.  3. Crohn's colitis based on review of records.  Maintained recently on      6MP 75 a day, Asacol 1.2 g a day.  Colonoscopy September 2008 by me      was normal.  No colitis.  No ileitis.   INTERVAL HISTORY:  I last saw Sabrina Hall two months ago.  Since then, she  was admitted to the hospital in mid to late November for what she told  me were electrolyte imbalances and some falling troubles.  She states  those are back to normal.  She moves her bowels two to four or five  times a day, not really diarrhea, just some soft stools.  She has no  abdominal pains.   CURRENT MEDICINES:  1. Toprol.  2. Caltrate.  3. Benicar.  4. Diazepam.  5. 6MP 75 mg once daily.  6. Metoprolol.  7. Multivitamin.  8. Amlodipine.  9. Lomotil.  10.Imodium.   Weight 130 pounds, blood pressure 160/74, pulse 72.  CONSTITUTIONAL:  Generally well-appearing.  ABDOMEN:  Soft, nontender, nondistended.  Normal bowel sounds.   ASSESSMENT AND PLAN:  Patient is an 77 year old woman with long history  of Crohn's disease.   No sign on her recent colonoscopy of any inflammatory disease in the  ileum or colon.   She will continue on her 6MP at its current dose, 75 mg a day.  We will  get a basic set of labs today and then every three months.  She will  return to see me in six months' time.     Milus Banister, MD  Electronically Signed    DPJ/MedQ  DD: 10/27/2007  DT: 10/27/2007  Job #: [id number]   cc:   [place] Pink

## 2011-02-11 NOTE — Assessment & Plan Note (Signed)
[facility]                         GASTROENTEROLOGY OFFICE NOTE   NAME:[name], [name]                   MRN:          [id number]  DATE:04/30/2007                            DOB:          [birth year]    PRIMARY CARE PHYSICIAN:  Maryland Pink, MD   PREVIOUS GASTROENTEROLOGIST:  Clarene Reamer, MD   GI PROBLEM LIST:  History of Crohn's disease, status post 72  ileocecectomy. Since then, has had trouble with diarrhea, intermittent  pains. Colonoscopies that have been on an every three year basis by Dr.  Lyla Son have shown ileitis at the anastomosis as well as some  colitis perianastomatically. I see no records of any inflammation in the  rest of her colon so I think that she has Crohn's ileitis with just a  mild bit of colitis at the anastomosis. She was last seen here one year  ago. She is on 6 MP 75 mg a day, Asacol 400 mg three times a day and she  takes Imodium alternating with Lomotil 2-3 pills every day. She has been  bothered for years with consistent urgency, frequency. She says she will  go anywhere from 2-10 times a day with intermittent nocturnal symptoms.  She has intermittent right lower quadrant pains as well.   CURRENT MEDICATIONS:  1. Asacol 1200 mg t.i.d.  2. 6 MP 75 mg a day.  3. Toprol.  4. Caltrate.  5. Triamterene/hydrochlorothiazide.  6. Benicar.  7. Hyoscyamine.  8. Lomotil.  9. Imodium on a p.r.n. basis.   INTERVAL HISTORY:  See the above GI problem list.   PHYSICAL EXAMINATION:  Weight 133 pounds. Blood pressure 130/18, pulse  60.  CONSTITUTIONAL: Generally well-appearing.  ABDOMEN: Soft and nontender, nondistended. Normal bowel sounds.   ASSESSMENT/PLAN:  An 76 year old woman with a history of Crohn's  disease, does not seem to be under good control.   Her last colonoscopy was with Dr.  Lyla Son 3-4 years ago. This  actually looked very normal. The anastomosis, there was no inflammation.  He  recommended routine colonoscopy every 3 years, which I do not think  necessarily needs to be done, but her Crohn's disease is not under good  control and so I think we should do a colonoscopy at this point since  her disease does not seem to be under very good control now. She may  need to be increased on her Asacol and perhaps her 6MP should be  increased. I will make recommendations following a colonoscopy and a  look at her terminal ileum. I will also get a set of labs including a  CBC and a  complete metabolic profile. The patient who is on 6MP should have lab  tests checked every 3-4 months to check for bone marrow and liver tests  toxicity.     Milus Banister, MD  Electronically Signed    DPJ/MedQ  DD: 04/30/2007  DT: 04/30/2007  Job #: [id number]   cc:   Mary[place] Pink

## 2011-02-14 NOTE — H&P (Signed)
NAME:  Sabrina Hall, Sabrina Hall NO.:  [id number]   MEDICAL RECORD NO.:  [id number]                   PATIENT TYPE:  INP   LOCATION:  2899                                 FACILITY:  [facility]   PHYSICIAN:  Hosie Spangle, M.D.            DATE OF BIRTH:  [birth year]   DATE OF ADMISSION:  06/22/2002  DATE OF DISCHARGE:                                HISTORY & PHYSICAL   HISTORY OF PRESENT ILLNESS:  The patient is a 77 year old woman who was  evaluated for degenerative changes of lumbar spine as well as an incidental  thoracic meningoma. The patient had been having some pain in the right hip,  knee, and heal. She underwent MRI of the lumbar spine which found  degenerative changes. She found that the pain and discomfort that she  experiences tends to vary. At this point, she is having more frequent pain  in the sacrococcygeal region. Some days she has no discomfort all. She  treats it symptomatically with Tylenol. She does not take NSAIDS due to  Crohn's disease. She does describe some tingling in the right foot but no  specific weakness.   PAST MEDICAL HISTORY:  1. History of hypertension.  2. Crohn's disease.  3. Basal cell skin cancer.   No history of myocardial infarction, stroke, diabetes, or lung disease.   PAST SURGICAL HISTORY:  1. Hysterectomy in 1969.  2. Bowel resection for blockage in 1978, removal of remaining ovary at that     time.  3. Removal of perirectal abscess in 1995.  4. Ileectomy and removal of contiguous intestinal bowel prior to     restructuring the colon.  5. Gallbladder removal in 1989.  6. Ongoing skin cancer removals, plastic surgery for cancerous lesions on     her face and nose.   ALLERGIES:  She reports no specific allergies, but she finds that she cannot  tolerate NSAIDS due to her Crohn's disease, and several antibiotics have had  a tendency to cause diarrhea.   CURRENT MEDICATIONS:  1. Asacol 1,200 mg b.i.d.  2.  Colestid 1 g q.a.m.  3. Purinethol 75 mg q.a.m.  4. Triamterene 75 mg q.a.m.  5. Evista 60 mg q.a.m.  6. Atacand 32 mg q.d.   FAMILY HISTORY:  Her parents have passed on. Her mother had cancer. Her  father had atherosclerosis. There is a family history of hypertension and  stroke.   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 She is  married. She does not smoke. She drinks alcohol socially. Denies history of  substance abuse.   REVIEW OF SYMPTOMS:  Notable as in history of present illness and past  medical history. Her 14-point review of systems is otherwise unremarkable.   PHYSICAL EXAMINATION:  GENERAL:  The patient is a well-developed, well-  nourished white female in no  acute distress. Temperature 97.5, pulse 79,  blood pressure 171/79, respiratory rate 16, height 5 feet 2, weight 134  pounds.  LUNG EXAM:  Lungs are clear to auscultation. She has symmetrical  respirations.  HEART:  Regular, rate, and rhythm, no S1 or S2, no murmur.  ABDOMEN:  Soft, nondistended, bowel sounds present.  EXTREMITY EXAM:  Shows no clubbing, cyanosis, or edema.  MUSCULOSKELETAL EXAM:  No tenderness of the lumbar spinous processes or  paralumbar musculature. There is no tenderness over the thoracic spinous  processes or perithoracic musculature. She is able to flex all extremities.  She is able to extend all extremities. Basically is negative bilaterally.  NEUROLOGICAL EXAM:  Shows 5/5 strength in the iliopsoas, quadriceps,  dorsiflexors, plantar flexors and extensors, as well as sensation is intact  to pinprick throughout the lower extremities. Reflexes 1 to 2 at the biceps  and triceps bilaterally as well as the quadriceps, gastrocnemius are 1  bilaterally. Toes are downgoing bilaterally. She has normal gait and stance.   LABORATORY DATA:  MRI of the lumbar spine and the lower thoracic spine  was  reviewed. The study was done without and with gadolinium. In the lower  thoracic spine, the T9-10 level, we see a large intradural extramedullary  tumor suggestive of a meningoma located on the right side. The spacing was  compressing the spinal cord to the left. In the lumbar spine, we see  circumferential spondylitic disk protrusion at the L3-4 level with resulting  mild to moderate multifactor lumbar stenosis at L4-5, disk spurring, and a  synovial cyst emanating from an arthropathic right L4-5 facet joint with  compression and displacement of the thecal sac and nerve root on the right  side at L4-5.   IMPRESSION:  1. 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 Also low back     discomfort and varying amount of radicular discomfort due to degenerative     changes of the low back including spondylitic disk herniation,     spondylosis, degenerative disk disease, and stenosis with synovial cyst     on the right side at L4-5.   PLAN:  The patient is admitted for a thoracic laminectomy for resection of  her intradural extramedullary tumor. I spoke with the patient and her  husband at length regarding the nature of surgery and the hospitalization,  as well as recuperation, the limitations postoperatively, and risks of  surgery including risk of infection, bleeding possibly needing transfusion,  spinal cord dysfunction and paralysis of her lower extremities, as well as  risks to her bowel and bladder function and anesthetic risks from myocardial  infarction, stroke, pneumonia, death. Understanding all of this, she does  wish to go ahead with surgery and is admitted for such.                                               Hosie Spangle, M.D.    RWN/MEDQ  D:  06/22/2002  T:  06/24/2002  Job:  [id number]

## 2011-02-14 NOTE — Op Note (Signed)
NAME:  Sabrina Hall, Sabrina Hall                        ACCOUNT NO.:  [id number]   MEDICAL RECORD NO.:  [id number]                   PATIENT TYPE:  INP   LOCATION:  3172                                 FACILITY:  [facility]   PHYSICIAN:  Hosie Spangle, M.D.            DATE OF BIRTH:  04/19/1923   DATE OF PROCEDURE:  06/22/2002  DATE OF DISCHARGE:                                 OPERATIVE REPORT   PREOPERATIVE DIAGNOSIS:  Thoracic meningioma.   POSTOPERATIVE DIAGNOSIS:  Thoracic meningioma.   PROCEDURE:  T8 to T10 thoracic laminectomy with gross total resection of  tumor, with microdissection and the C02 laser.   SURGEON:  Hosie Spangle, M.D.   ANESTHESIA:  General endotracheal anesthesia.   INDICATIONS:  This is a 77 year old woman who was evaluated for low back and  lumbar radicular pain who was found to have generative changes to the lumbar  region but who was found to have an incidental, and on examination,  neurologically intact intradural extramedullary tumor at the T9-T10 level.  It was suspected that it most likely represented a meningioma and a decision  was made to proceed with elective thoracic laminectomy with resection of  tumor.   PROCEDURE:  The patient was brought to the operating room and placed under  general endotracheal anesthesia.  The patient was turned to the prone  position.  The thoracolumbar region was prepped with Betadine soap solution  and draped in a sterile fashion.  X-rays were taken to localize the T9-T10  level and then the midline was infiltrated in a localized segment with  epinephrine.  A midline incision was made and carried down to the  subcutaneous tissue.  Bipolar cautery and electrocautery used to maintain  hemostasis.  Dissection was carried down to the deep fascia which was  incised bilaterally and the paraspinal muscles were dissected from the  spinous process of the lamina in a subperiosteal fashion.  Additional x-rays  were taken and  the T8, T9 and T10 spinous processes of the lamina were  identified.  Then we proceeded with a thoracic laminectomy using double  action rongeurs, the black Max drill and 2 and 3-mm Kerrison punches with  thin footplates.  The microscope was draped and brought into the field to  allow additional magnification, illumination and visualization and the  laminectomy and intradural portions were doubly microdissected for  microsurgery.   The dura was opened in the midline with care taken to leave the underlying  structures undisturbed.  Tenting sutures were placed and the tumor was  immediately visualized.  It was located on the right side dorsally with  displacement of the spinal cord to the left and anteriorly.  Arachnoid  adhesions over the tumor were carefully opened and a nerve root that was  draped over it was mobilized medially.  The tumor was seen to clearly  originate from the lateral wall of the dural sac on the  right side and with  the least amount of manipulation, the surface was coagulated and incised and  the C02 laser was used in a freehand manner.  The tumor was cored out with  laser vaporization.  The tissue was irrigated frequently throughout the  laser resection and moistened cosmetic paddings were placed over the spinal  cord and nerve roots.  As the tumor was debulked, we were able to more  easily mobilize it from the surrounding structures.  We were eventually able  to mobilize it entirely.  The remaining portions were sent to pathology for  permanent examination.  We then carefully examined the origin of the tumor  off of the lateral wall of the dura.  The surface was coagulated and no  tumor was left.  It was felt that a gross total resection had been achieved.   The wound was irrigated with saline solution.  Check for hemostasis was  established and confirmed, and then the wound was closed with running 6-0  Prolene suture in a watertight fashion.  Gelfoam soaked in  thrombin was  placed in the laminectomy defect.  Good hemostasis was established and then  the paraspinous muscles were approximated with interrupted undyed 0 Vicryl  suture, the deep fascia closed with interrupted undyed 0 Vicryl suture, the  subcutaneous and subcuticular were closed with interrupted running 2-0  Vicryl sutures and the skin edges were approximated with DermaBond.  The  patient tolerated the procedure well.  The estimated blood loss was 75 cc.  The sponge and instrument count correct.   Following surgery the patient was turned back to a supine position, reversed  from anesthetic, extubated and transferred to the recovery room for further  care where she was noted to be moving all four extremities to command.                                               Hosie Spangle, M.D.    RWN/MEDQ  D:  06/22/2002  T:  06/23/2002  Job:  [id number]   cc:   Hosie Spangle, M.D.

## 2011-02-14 NOTE — Assessment & Plan Note (Signed)
[facility] OFFICE NOTE   NAME:Hall Hall STRONG                   MRN:          [id number]  DATE:04/17/2006                            DOB:          [birth year]    Sabrina Hall comes in on April 17, 2006 to follow up on her Crohn's.  She says she  has good and bad days, diarrhea intermittently.  She says she has noticed  some blood from her hemorrhoids and she had some questions about her  medication.  She says she had some reflux symptoms, but if she has taken the  Zegerid or other medications include the Asacol 40 mg 3 times a day, 6-MP 75  mg daily, Toprol, Caltrate, hydrochlorothiazide, and triamterene, Benicar,  Lomotil as needed.   PHYSICAL EXAMINATION:  VITAL SIGNS:  Blood pressure 160/92, pulse 76 and  regular.  NECK, HEART, EXTREMITIES:  All basically unremarkable.   IMPRESSION:  1.  Crohn's colitis in remission.  2.  Status post cervical disc surgery.  3.  Arthritis.  4.  History of diverticular disease.  5.  History of skin cancers.  6.  Status post colon polyps.   RECOMMENDATIONS:  Try Azulfidine 5 mg 2 twice a day because it is cheaper.  I gave her some Zegerid and I want her to keep on 6 MP 75 mg a day.  See how  she does with this new regimen.                                   Clarene Reamer, MD   SML/MedQ  DD:  04/20/2006  DT:  04/21/2006  Job #:  [id number]

## 2011-02-25 ENCOUNTER — Telehealth: Payer: Self-pay | Admitting: Gastroenterology

## 2011-02-25 NOTE — Telephone Encounter (Signed)
Pt had labs last week in [place] and was told that she had elevated liver test she will have those labs sent to [place] for Dr Ardis Hughs review.

## 2011-02-28 ENCOUNTER — Telehealth: Payer: Self-pay | Admitting: Gastroenterology

## 2011-02-28 NOTE — Telephone Encounter (Signed)
Left message on machine to call back  

## 2011-02-28 NOTE — Telephone Encounter (Signed)
Labs from [place] dated 5/21 Cbc essentially normal LFTs normal except ALT 137.  Please call her.  ALT has been elevated over past 1-2 years periodically.  Probably a result of her 58m, I don't think the dose needs to be adjusted given the stability of the ALT (not dramatically rising) and she needs the meds to control her Crohn's.  Needs repeat LFTs in 3-4 weeks and rov with me shortly afterwards.

## 2011-03-03 NOTE — Telephone Encounter (Signed)
Pt aware labs and rov scheduled

## 2011-04-21 ENCOUNTER — Other Ambulatory Visit (INDEPENDENT_AMBULATORY_CARE_PROVIDER_SITE_OTHER): Payer: Medicare Other

## 2011-04-21 ENCOUNTER — Encounter: Payer: Self-pay | Admitting: Gastroenterology

## 2011-04-21 ENCOUNTER — Ambulatory Visit (INDEPENDENT_AMBULATORY_CARE_PROVIDER_SITE_OTHER): Payer: Medicare Other | Admitting: Gastroenterology

## 2011-04-21 VITALS — BP 136/74 | HR 62 | Ht 64.0 in | Wt 130.0 lb

## 2011-04-21 DIAGNOSIS — R7989 Other specified abnormal findings of blood chemistry: Secondary | ICD-10-CM

## 2011-04-21 DIAGNOSIS — K5090 Crohn's disease, unspecified, without complications: Secondary | ICD-10-CM

## 2011-04-21 LAB — HEPATIC FUNCTION PANEL
Albumin: 3.9 g/dL (ref 3.5–5.2)
Alkaline Phosphatase: 47 U/L (ref 39–117)

## 2011-04-21 MED ORDER — CHOLESTYRAMINE 4 G PO PACK: 1.0000 | PACK | Freq: Every day | ORAL | Status: DC

## 2011-04-21 NOTE — Progress Notes (Signed)
GI PROBLEM LIST:  1. Crohn's disease, status post 1988 ileocecostomy. Long term patient of Dr. Inocente Salles [facility]. Crohn's colitis based on review of records. Maintained recently on 6MP 75 a day, Asacol 1.2 g a day. Colonoscopy September 2008 by me was normal. No colitis. No ileitis. Flare of diarrhea starting August, 2011, increased mercaptopurine to 100 mg a day however back down when liver tests bumped significantly. September, 2011: decreased mercaptopurine back to 75 mg a day ( liver tests normalized) , started Entocort with good success however it was cost prohibitive and so prednisone 10 mg once daily was substituted. Very effective. October, 2011 began to taper prednisone slowly. Prednisone 5 mg every other day Was very helpful.  January 2012 doing well. 2. External hemorrhoids, June 2009, causing anal rectal discomfort. Improved with conservative therapies.   HPI: This is a Very pleasant 77 year old woman whom I last saw 6 months ago.  Has been very congested with allergies recently.  Takes claritin 1-2 times a week.  Her bowels have been "crazy" lately.  She's been having 6-7 loose stools a day lately.  Rarely at night.  Has been 2 weeks of this.  The diarrhea has been nonbloody.   Past Medical History:   Hypertension                                                 Crohn's                                                     Past Surgical History:   ILEOCECOSTOMY                                                NECK SURGERY                                                 CHOLECYSTECTOMY                                              HYSTERECTOMY                                                 reports that she has never smoked. She does not have any smokeless tobacco history on file. She reports that she does not drink alcohol or use illicit drugs.  family history is not on file.    Current medicines and allergies were reviewed in [facility]    Physical Exam: BP 136/74  Pulse 62   Ht 5' 4"  (1.626 m)  Wt 130 lb (58.968 kg)  BMI 22.31 kg/m2 Constitutional: generally well-appearing Psychiatric: alert and oriented x3 Abdomen: soft, nontender, nondistended, no obvious  ascites, no peritoneal signs, normal bowel sounds     Assessment and plan: 77 y.o. female with Crohn's  She has previously responded to increases of prednisone however at this time I would like to try her on cholestyramine powder since she has had ileocecectomy remotely. She will call to report on her response to cholestyramine in 3-4 weeks. She will otherwise keep her medicines the same.

## 2011-04-21 NOTE — Patient Instructions (Signed)
New prescription was called in. Take one packet every day to help solidify your stools. Call Dr. Ardis Hughs' in 3-4 weeks to report on your symptoms. Otherwise no changes in meds.

## 2011-05-13 ENCOUNTER — Telehealth: Payer: Self-pay | Admitting: Gastroenterology

## 2011-05-13 NOTE — Telephone Encounter (Signed)
Pt calling to report on condition after starting cholestyramine she took it for 2 weeks and feels ok now with out it.

## 2011-05-14 NOTE — Telephone Encounter (Signed)
Pt aware.

## 2011-05-14 NOTE — Telephone Encounter (Signed)
Great. Please let her know to restart it at one dose per day if loose stools return.

## 2012-01-22 ENCOUNTER — Other Ambulatory Visit: Payer: Self-pay | Admitting: Gastroenterology

## 2012-03-23 ENCOUNTER — Encounter: Payer: Self-pay | Admitting: *Deleted

## 2012-03-23 ENCOUNTER — Encounter: Payer: Self-pay | Admitting: Cardiovascular Disease

## 2012-03-26 ENCOUNTER — Encounter: Payer: Self-pay | Admitting: *Deleted

## 2012-03-26 ENCOUNTER — Ambulatory Visit: Payer: Medicare Other | Admitting: Cardiovascular Disease

## 2012-03-31 ENCOUNTER — Encounter: Payer: Self-pay | Admitting: Cardiovascular Disease

## 2012-03-31 ENCOUNTER — Ambulatory Visit (INDEPENDENT_AMBULATORY_CARE_PROVIDER_SITE_OTHER): Payer: Medicare Other | Admitting: Cardiovascular Disease

## 2012-03-31 VITALS — BP 170/80 | HR 58 | Ht 64.0 in | Wt 128.5 lb

## 2012-03-31 DIAGNOSIS — I10 Essential (primary) hypertension: Secondary | ICD-10-CM

## 2012-03-31 MED ORDER — HYDRALAZINE HCL 25 MG PO TABS: 25.0000 mg | ORAL_TABLET | Freq: Three times a day (TID) | ORAL | Status: DC

## 2012-03-31 MED ORDER — AMLODIPINE BESYLATE 5 MG PO TABS: 5.0000 mg | ORAL_TABLET | Freq: Two times a day (BID) | ORAL | Status: DC

## 2012-03-31 NOTE — Patient Instructions (Addendum)
You are doing well. Please take an extra amlodipine 1/2 pill in the evening  Please start hydralazine one in the AM and PM  Please call us if you have new issues that need to be addressed before your next appt.  Your physician wants you to follow-up in:  3 weeks

## 2012-03-31 NOTE — Assessment & Plan Note (Addendum)
No significant lower extremity edema on today's visit. We have suggested she increase her amlodipine to 5 mg in the morning, 2.5 mg in the evening. I also suggested she take hydralazine 25 mg twice a day. We will meet with her again in several weeks for further medication titration.  Notes indicate renal artery ultrasound in 2011 done at [facility] while she was seen Roderic Palau. We'll not repeat this at this time. She is also mentioned that she has significant expenses associated with her husbands illnesses and would like to hold off on any other testing at this time.

## 2012-03-31 NOTE — Progress Notes (Signed)
Patient ID: Sabrina Hall, female    DOB: 1922-11-01, 78 y.o.   MRN: [id number]  HPI Comments: Sabrina Hall is a pleasant 78 her old woman who is a patient of Dr. Kary Kos who currently lives at [facility] with a long history of hypertension, Crohn's disease, history of dermatologic issues we've notes dating back for several years documenting hypertension and trials on various medications. She is referred for further evaluation for hypertension. She does report a smoking history for 30 years, quit in 1989  She reports that she currently takes losartan 100 mg daily, amlodipine 5 mg daily, metoprolol tartrate twice a day. She does not like taking medications in general. Notes indicate large, edema on amlodipine 10 mg in 2009. It appears from old notes she has also had a renal artery ultrasound done in 2011 by report was normal. Notes indicate electrolyte abnormality on Maxzide in March 2009. She does not remember starting hydralazine though it is on the most recent medication list. Notes indicate she tried clonidine in the past but did not tolerate this.  She is having blood pressure checks at [facility] with systolic pressures consistently in the 170-180 range. She checks her blood pressure at home and a nurse checks her pressure is well. Numbers are consistently elevated. She denies any significant symptoms and otherwise feels well.  Recent lab work shows total cholesterol 167, LDL 72, HDL 55 Normal renal function with creatinine 0.7  EKG shows normal sinus rhythm with rate 58 beats per minute, no significant ST or T wave changes  Family history; father died of stroke and atherosclerosis age 60, other died 85 from cancer   Outpatient Encounter Prescriptions as of 03/31/2012  Medication Sig Dispense Refill  . amLODipine (NORVASC) 5 MG tablet Take 1 tablet (5 mg total) by mouth  daily.  60 tablet  6  . aspirin 81 MG tablet Take 81 mg by mouth daily.      . cycloSPORINE (RESTASIS) 0.05 %  ophthalmic emulsion Place 1 drop into both eyes 2 (two) times daily.      Marland Kitchen DIAZEPAM PO Take 2.5 mg by mouth at bedtime.      Marland Kitchen loperamide (IMODIUM) 2 MG capsule Take 2 mg by mouth 2 (two) times daily.      Marland Kitchen losartan (COZAAR) 100 MG tablet Take 100 mg by mouth daily.      . MERCAPTOPURINE PO Take 75 mg by mouth daily.      . metoprolol (LOPRESSOR) 100 MG tablet Takes 1 and 1/2 tablet am and 1/2 tablet pm.      . Multiple Vitamin (MULTIVITAMIN) tablet Take 1 tablet by mouth daily.        . Omega-3 350 MG CAPS Take by mouth. 1 TAB TWICE DAILY        . predniSONE (DELTASONE) 5 MG tablet TAKE ONE TABLET BY MOUTH EVERY OTHER DAY  30 tablet  3    Review of Systems  Constitutional: Negative.   HENT: Negative.   Eyes: Negative.   Respiratory: Negative.   Cardiovascular: Negative.   Gastrointestinal: Negative.   Musculoskeletal: Negative.   Skin: Negative.   Neurological: Negative.   Hematological: Negative.   Psychiatric/Behavioral: Negative.   All other systems reviewed and are negative.      Physical Exam  Nursing note and vitals reviewed. Constitutional: She is oriented to person, place, and time. She appears well-developed and well-nourished.  HENT:  Head: Normocephalic.  Nose: Nose normal.  Mouth/Throat: Oropharynx is clear  and moist.  Eyes: Conjunctivae are normal. Pupils are equal, round, and reactive to light.  Neck: Normal range of motion. Neck supple. No JVD present.  Cardiovascular: Normal rate, regular rhythm, S1 normal, S2 normal, normal heart sounds and intact distal pulses.  Exam reveals no gallop and no friction rub.   No murmur heard. Pulmonary/Chest: Effort normal and breath sounds normal. No respiratory distress. She has no wheezes. She has no rales. She exhibits no tenderness.  Abdominal: Soft. Bowel sounds are normal. She exhibits no distension. There is no tenderness.  Musculoskeletal: Normal range of motion. She exhibits no edema and no tenderness.    Lymphadenopathy:    She has no cervical adenopathy.  Neurological: She is alert and oriented to person, place, and time. Coordination normal.  Skin: Skin is warm and dry. No rash noted. No erythema.  Psychiatric: She has a normal mood and affect. Her behavior is normal. Judgment and thought content normal.         Assessment and Plan

## 2012-04-15 ENCOUNTER — Encounter: Payer: Self-pay | Admitting: Cardiovascular Disease

## 2012-04-15 ENCOUNTER — Ambulatory Visit (INDEPENDENT_AMBULATORY_CARE_PROVIDER_SITE_OTHER): Payer: Medicare Other | Admitting: Cardiovascular Disease

## 2012-04-15 VITALS — BP 144/64 | HR 56 | Ht 64.0 in | Wt 130.5 lb

## 2012-04-15 DIAGNOSIS — I10 Essential (primary) hypertension: Secondary | ICD-10-CM

## 2012-04-15 MED ORDER — METOPROLOL TARTRATE 100 MG PO TABS: 100.0000 mg | ORAL_TABLET | Freq: Two times a day (BID) | ORAL | Status: DC

## 2012-04-15 MED ORDER — METOPROLOL TARTRATE 100 MG PO TABS: ORAL_TABLET | ORAL | Status: DC

## 2012-04-15 NOTE — Progress Notes (Signed)
Patient ID: Sabrina Hall, female    DOB: 11/06/1922, 78 y.o.   MRN: [id number]  HPI Comments: Sabrina Hall is a pleasant 78 her old woman who is a patient of Dr. Kary Kos who currently lives at [facility] with a long history of hypertension, Crohn's disease, history of dermatologic issues we've notes dating back for several years documenting hypertension and trials on various medications. She is referred for further evaluation for hypertension. She does report a smoking history for 30 years, quit in 1989  She reports that she has been doing well. She has been taking amlodipine 5 mg twice a day, Toprol 150 mg in the morning and 50 mg in the evening with losartan and her blood pressure has been well-controlled. She presents today with numbers showing systolic pressure ranging from 115-135 on a regular basis. She feels well, denies any significant anxiety at home.  She's not exercising on a regular basis as she used to in the past. She is no longer swimming. She has a very small amount of edema around her ankles at the end of the day otherwise she's not particularly bothered with any swelling. She does note bradycardia with heart rates typically in the low 50s.   lab work shows total cholesterol 167, LDL 72, HDL 55,  Normal renal function with creatinine 0.7  EKG shows normal sinus rhythm with rate 56 beats per minute, no significant ST or T wave changes Family history; father died of stroke and atherosclerosis age 76, other died 34 from cancer   Outpatient Encounter Prescriptions as of 03/31/2012  Medication Sig Dispense Refill  . amLODipine (NORVASC) 5 MG tablet Take 1 tablet (5 mg total) by mouth  daily.  60 tablet  6  . aspirin 81 MG tablet Take 81 mg by mouth daily.      . cycloSPORINE (RESTASIS) 0.05 % ophthalmic emulsion Place 1 drop into both eyes 2 (two) times daily.      Marland Kitchen DIAZEPAM PO Take 2.5 mg by mouth at bedtime.      Marland Kitchen loperamide (IMODIUM) 2 MG capsule Take 2 mg by mouth 2 (two)  times daily.      Marland Kitchen losartan (COZAAR) 100 MG tablet Take 100 mg by mouth daily.      . MERCAPTOPURINE PO Take 75 mg by mouth daily.      . metoprolol (LOPRESSOR) 100 MG tablet Takes 1 and 1/2 tablet am and 1/2 tablet pm.      . Multiple Vitamin (MULTIVITAMIN) tablet Take 1 tablet by mouth daily.        . Omega-3 350 MG CAPS Take by mouth. 1 TAB TWICE DAILY        . predniSONE (DELTASONE) 5 MG tablet TAKE ONE TABLET BY MOUTH EVERY OTHER DAY  30 tablet  3    Review of Systems  Constitutional: Negative.   HENT: Negative.   Eyes: Negative.   Respiratory: Negative.   Cardiovascular: Negative.   Gastrointestinal: Negative.   Musculoskeletal: Negative.   Skin: Negative.   Neurological: Negative.   Hematological: Negative.   Psychiatric/Behavioral: Negative.   All other systems reviewed and are negative.    BP 144/64  Pulse 56  Ht 5' 4"  (1.626 m)  Wt 130 lb 8 oz (59.194 kg)  BMI 22.40 kg/m2  Physical Exam  Nursing note and vitals reviewed. Constitutional: She is oriented to person, place, and time. She appears well-developed and well-nourished.  HENT:  Head: Normocephalic.  Nose: Nose normal.  Mouth/Throat: Oropharynx  is clear and moist.  Eyes: Conjunctivae are normal. Pupils are equal, round, and reactive to light.  Neck: Normal range of motion. Neck supple. No JVD present.  Cardiovascular: Normal rate, regular rhythm, S1 normal, S2 normal, normal heart sounds and intact distal pulses.  Exam reveals no gallop and no friction rub.   No murmur heard. Pulmonary/Chest: Effort normal and breath sounds normal. No respiratory distress. She has no wheezes. She has no rales. She exhibits no tenderness.  Abdominal: Soft. Bowel sounds are normal. She exhibits no distension. There is no tenderness.  Musculoskeletal: Normal range of motion. She exhibits no edema and no tenderness.  Lymphadenopathy:    She has no cervical adenopathy.  Neurological: She is alert and oriented to person,  place, and time. Coordination normal.  Skin: Skin is warm and dry. No rash noted. No erythema.  Psychiatric: She has a normal mood and affect. Her behavior is normal. Judgment and thought content normal.         Assessment and Plan

## 2012-04-15 NOTE — Patient Instructions (Addendum)
You are doing well. Please decrease metoprolol to one in the Am and a 1/2 pill in the PM  Please call us if you have new issues that need to be addressed before your next appt.  Your physician wants you to follow-up in: 6 months.  You will receive a reminder letter in the mail two months in advance. If you don't receive a letter, please call our office to schedule the follow-up appointment.

## 2012-04-15 NOTE — Assessment & Plan Note (Addendum)
Blood pressure is well controlled Heart rate is slow and we have suggested she decrease her Toprol to 100 mg in the morning and 50 mg in the p.m. No significant edema on amlodipine 5 mg twice a day. We have suggested she continue her other medications at their current doses as she is doing so well.

## 2012-05-11 ENCOUNTER — Ambulatory Visit: Payer: Self-pay | Admitting: Family Medicine

## 2012-06-29 ENCOUNTER — Encounter: Payer: Self-pay | Admitting: Cardiovascular Disease

## 2012-06-29 ENCOUNTER — Ambulatory Visit (INDEPENDENT_AMBULATORY_CARE_PROVIDER_SITE_OTHER): Payer: Medicare Other | Admitting: Cardiovascular Disease

## 2012-06-29 VITALS — BP 132/64 | HR 57 | Ht 64.0 in | Wt 129.0 lb

## 2012-06-29 DIAGNOSIS — R42 Dizziness and giddiness: Secondary | ICD-10-CM

## 2012-06-29 DIAGNOSIS — I10 Essential (primary) hypertension: Secondary | ICD-10-CM

## 2012-06-29 MED ORDER — METOPROLOL TARTRATE 50 MG PO TABS: ORAL_TABLET | ORAL | Status: DC

## 2012-06-29 NOTE — Progress Notes (Signed)
Patient ID: Sabrina Hall, female    DOB: 1923/05/04, 78 y.o.   MRN: [id number]  HPI Comments: Sabrina Hall is a pleasant 78 yo woman who is a patient of Dr. Kary Kos who currently lives at [facility] with a long history of hypertension, Crohn's disease, history of dermatologic issues we've notes dating back for several years documenting hypertension and trials on various medications, smoking history for 30 years, quit in 1989, who presents for routine followup  She reports that she has been doing well but she is very concerned about labile blood pressure and heart rates. She does have several measurements from [facility] in general, her blood pressure has been well-controlled, occasionally elevated. Heart rate has been significantly low at times, rarely into the 40s she does have chronic fatigue, poor energy though otherwise has no other symptoms.    previous  lab work shows total cholesterol 167, LDL 72, HDL 55,  Normal renal function with creatinine 0.7  EKG shows normal sinus rhythm with rate 57 beats per minute, no significant ST or T wave changes Family history; father died of stroke and atherosclerosis age 43, other died 48 from cancer   Outpatient Encounter Prescriptions as of 76/09/2011  Medication Sig Dispense Refill  . amLODipine (NORVASC) 5 MG tablet Take 1 tablet (5 mg total) by mouth 2 (two) times daily.  60 tablet  6  . aspirin 81 MG tablet Take 81 mg by mouth daily.      . cycloSPORINE (RESTASIS) 0.05 % ophthalmic emulsion Place 1 drop into both eyes 2 (two) times daily as needed.       Marland Kitchen DIAZEPAM PO Take 2.5 mg by mouth 3 times/day as needed-between meals & bedtime.       . hydrALAZINE (APRESOLINE) 25 MG tablet Take 25 mg by mouth 2 (two) times daily.      Marland Kitchen loperamide (IMODIUM) 2 MG capsule Take 2 mg by mouth 2 (two) times daily as needed.       Marland Kitchen losartan (COZAAR) 100 MG tablet Take 100 mg by mouth daily.      . MERCAPTOPURINE PO Takes 1 /1/2 tablet daily.      .  metoprolol (LOPRESSOR) 50 MG tablet Please take twice a day  60 tablet  6  . Multiple Vitamin (MULTIVITAMIN) tablet Take 1 tablet by mouth daily.        . Omega-3 350 MG CAPS Take by mouth. 1 TAB TWICE DAILY        . predniSONE (DELTASONE) 5 MG tablet TAKE ONE TABLET BY MOUTH EVERY OTHER DAY  30 tablet  3  . DISCONTD: metoprolol (LOPRESSOR) 100 MG tablet Take 1 tablet in the morning and 1/2 tablet in the evening  135 tablet  3     Review of Systems  Constitutional: Positive for fatigue.  HENT: Negative.   Eyes: Negative.   Respiratory: Negative.   Cardiovascular: Negative.   Gastrointestinal: Negative.   Musculoskeletal: Negative.   Skin: Negative.   Neurological: Negative.   Hematological: Negative.   Psychiatric/Behavioral: Negative.   All other systems reviewed and are negative.    BP 132/64  Pulse 57  Ht 5' 4"  (1.626 m)  Wt 129 lb (58.514 kg)  BMI 22.14 kg/m2  Physical Exam  Nursing note and vitals reviewed. Constitutional: She is oriented to person, place, and time. She appears well-developed and well-nourished.  HENT:  Head: Normocephalic.  Nose: Nose normal.  Mouth/Throat: Oropharynx is clear and moist.  Eyes: Conjunctivae normal  are normal. Pupils are equal, round, and reactive to light.  Neck: Normal range of motion. Neck supple. No JVD present.  Cardiovascular: Normal rate, regular rhythm, S1 normal, S2 normal, normal heart sounds and intact distal pulses.  Exam reveals no gallop and no friction rub.   No murmur heard. Pulmonary/Chest: Effort normal and breath sounds normal. No respiratory distress. She has no wheezes. She has no rales. She exhibits no tenderness.  Abdominal: Soft. Bowel sounds are normal. She exhibits no distension. There is no tenderness.  Musculoskeletal: Normal range of motion. She exhibits no edema and no tenderness.  Lymphadenopathy:    She has no cervical adenopathy.  Neurological: She is alert and oriented to person, place, and time.  Coordination normal.  Skin: Skin is warm and dry. No rash noted. No erythema.  Psychiatric: She has a normal mood and affect. Her behavior is normal. Judgment and thought content normal.         Assessment and Plan

## 2012-06-29 NOTE — Assessment & Plan Note (Signed)
Blood pressure is well controlled. Heart rate is quite slow and she does have significant fatigue. We have suggested she decrease her metoprolol tartrate to 50 mg twice a day. If she continues to have symptoms of fatigue with bradycardia, we could decrease the metoprolol to 25 mg twice a day.

## 2012-06-29 NOTE — Patient Instructions (Addendum)
Please decrease the metoprolol to 1/2 pill twice a day Please continue to monitor the heart rate. If the rate continues to run in the 50s, call the office . We would decrease the metoprolol to 25 mg twice a day  Please call us if you have new issues that need to be addressed before your next appt.  Your physician wants you to follow-up in: 6 months.  You will receive a reminder letter in the mail two months in advance. If you don't receive a letter, please call our office to schedule the follow-up appointment.

## 2012-11-09 ENCOUNTER — Other Ambulatory Visit: Payer: Self-pay | Admitting: Cardiovascular Disease

## 2013-01-26 ENCOUNTER — Other Ambulatory Visit: Payer: Self-pay | Admitting: Cardiovascular Disease

## 2013-01-26 NOTE — Telephone Encounter (Signed)
Refilled Metoprolol sent to [facility]. Pt is overdue for 6 month f/u scheduled appointment for 03/01/2013 with Dr. Rockey Situ.

## 2013-02-28 ENCOUNTER — Ambulatory Visit (INDEPENDENT_AMBULATORY_CARE_PROVIDER_SITE_OTHER): Payer: Medicare Other | Admitting: Cardiovascular Disease

## 2013-02-28 ENCOUNTER — Encounter: Payer: Self-pay | Admitting: Cardiovascular Disease

## 2013-02-28 VITALS — BP 128/72 | HR 58 | Ht 64.0 in | Wt 132.8 lb

## 2013-02-28 DIAGNOSIS — K5090 Crohn's disease, unspecified, without complications: Secondary | ICD-10-CM

## 2013-02-28 DIAGNOSIS — R001 Bradycardia, unspecified: Secondary | ICD-10-CM

## 2013-02-28 DIAGNOSIS — I498 Other specified cardiac arrhythmias: Secondary | ICD-10-CM

## 2013-02-28 DIAGNOSIS — I10 Essential (primary) hypertension: Secondary | ICD-10-CM

## 2013-02-28 NOTE — Progress Notes (Signed)
Patient ID: Sabrina Hall, female    DOB: 1923-01-09, 79 y.o.   MRN: [id number]  HPI Comments: Sabrina Hall is a pleasant 79 yo woman who is a patient of Dr. Kary Kos who currently lives at [facility] with a long history of hypertension, Crohn's disease, history of dermatologic issues we've notes dating back for several years documenting hypertension and trials on various medications, smoking history for 30 years, quit in 1989, who presents for routine followup  She reports that she has been doing well. She is a did, precipitating in a [employer] course at [facility]. She does some activity, not particularly a aggressive exercise regimen. She reports that her blood pressure has been well-controlled when she checks this periodically. She is quite surprised but happy.  On her last clinic visit, we suggested decreasing her metoprolol for bradycardia. She has not done this and takes 50 mg twice a day.  she does have chronic fatigue,  though otherwise has no other symptoms.    previous  lab work shows total cholesterol 167, LDL 72, HDL 55 and 2012 , Normal renal function with creatinine 0.7  EKG shows normal sinus rhythm with rate 58 beats per minute, no significant ST or T wave changes Family history; father died of stroke and atherosclerosis age 55, other died 80 from cancer   Outpatient Encounter Prescriptions as of 02/28/2013  Medication Sig Dispense Refill  . amLODipine (NORVASC) 5 MG tablet TAKE ONE TABLET BY MOUTH TWICE DAILY  60 tablet  6  . aspirin 81 MG tablet Take 81 mg by mouth daily.      . Cyanocobalamin (VITAMIN B 12 PO) Take 500 mcg by mouth daily.      Marland Kitchen DIAZEPAM PO Take 2.5 mg by mouth 3 times/day as needed-between meals & bedtime.       . fish oil-omega-3 fatty acids 1000 MG capsule Take 1 g by mouth 2 (two) times daily.      . hydrALAZINE (APRESOLINE) 25 MG tablet Take 25 mg by mouth 2 (two) times daily.      Marland Kitchen loperamide (IMODIUM) 2 MG capsule Take 2 mg by mouth 2 (two)  times daily as needed.       Marland Kitchen losartan (COZAAR) 100 MG tablet Take 100 mg by mouth daily.      . MERCAPTOPURINE PO 50 mg. Takes 1 /1/2 tablet daily.      . metoprolol (LOPRESSOR) 50 MG tablet TAKE ONE TABLET BY MOUTH TWICE DAILY  60 tablet  3  . Multiple Vitamin (MULTIVITAMIN) tablet Take 1 tablet by mouth daily.        Vladimir Faster Glycol-Propyl Glycol (SYSTANE OP) Apply to eye 4 (four) times daily.      . predniSONE (DELTASONE) 5 MG tablet TAKE ONE TABLET BY MOUTH EVERY OTHER DAY  30 tablet  3  . [DISCONTINUED] cycloSPORINE (RESTASIS) 0.05 % ophthalmic emulsion Place 1 drop into both eyes 2 (two) times daily as needed.       . [DISCONTINUED] Omega-3 350 MG CAPS Take by mouth. 1 TAB TWICE DAILY         No facility-administered encounter medications on file as of 02/28/2013.     Review of Systems  Constitutional: Positive for fatigue.  HENT: Negative.   Eyes: Negative.   Respiratory: Negative.   Cardiovascular: Negative.   Gastrointestinal: Negative.   Musculoskeletal: Negative.   Skin: Negative.   Neurological: Negative.   Psychiatric/Behavioral: Negative.   All other systems reviewed and are negative.  BP 128/72  Pulse 58  Ht 5' 4"  (1.626 m)  Wt 132 lb 12 oz (60.215 kg)  BMI 22.78 kg/m2  Physical Exam  Nursing note and vitals reviewed. Constitutional: She is oriented to person, place, and time. She appears well-developed and well-nourished.  HENT:  Head: Normocephalic.  Nose: Nose normal.  Mouth/Throat: Oropharynx is clear and moist.  Eyes: Conjunctivae are normal. Pupils are equal, round, and reactive to light.  Neck: Normal range of motion. Neck supple. No JVD present.  Cardiovascular: Normal rate, regular rhythm, S1 normal, S2 normal, normal heart sounds and intact distal pulses.  Exam reveals no gallop and no friction rub.   No murmur heard. Pulmonary/Chest: Effort normal and breath sounds normal. No respiratory distress. She has no wheezes. She has no rales. She  exhibits no tenderness.  Abdominal: Soft. Bowel sounds are normal. She exhibits no distension. There is no tenderness.  Musculoskeletal: Normal range of motion. She exhibits no edema and no tenderness.  Lymphadenopathy:    She has no cervical adenopathy.  Neurological: She is alert and oriented to person, place, and time. Coordination normal.  Skin: Skin is warm and dry. No rash noted. No erythema.  Psychiatric: She has a normal mood and affect. Her behavior is normal. Judgment and thought content normal.    Assessment and Plan

## 2013-02-28 NOTE — Assessment & Plan Note (Signed)
She reports that her Crohn's is acting up. She has not yet established with local GI physician

## 2013-02-28 NOTE — Assessment & Plan Note (Signed)
Blood pressure is well controlled on today's visit. No changes made to the medications. 

## 2013-02-28 NOTE — Assessment & Plan Note (Signed)
We have suggested that she cut back on her metoprolol to 25 mg twice a day

## 2013-02-28 NOTE — Patient Instructions (Addendum)
You are doing well. Consider taking 1/2 metoprolol twice a day (down from a full pill twice a day)  Please call us if you have new issues that need to be addressed before your next appt.  Your physician wants you to follow-up in: 6 months.  You will receive a reminder letter in the mail two months in advance. If you don't receive a letter, please call our office to schedule the follow-up appointment.

## 2013-03-01 ENCOUNTER — Ambulatory Visit: Payer: Medicare Other | Admitting: Cardiovascular Disease

## 2013-03-02 ENCOUNTER — Other Ambulatory Visit: Payer: Self-pay | Admitting: Gastroenterology

## 2013-03-16 ENCOUNTER — Other Ambulatory Visit: Payer: Self-pay | Admitting: Cardiovascular Disease

## 2013-03-21 ENCOUNTER — Other Ambulatory Visit: Payer: Self-pay

## 2013-03-21 NOTE — Telephone Encounter (Signed)
-  Spoke with [facility] and they mentioned that pt had a 90 day supply of Losartan 100 mg refilled on Feb 16, 2013 and they said that pt should have enough medication to last her until her next refill. Pt's insurance will not pay for medication bc it is to soon for her to have Rx refilled.  -Spoke with pt today and she mentioned that she has 1 pill left for tomorrow. She has been taking Losartan once a day. She seems to believe that she was shorted pills but is not sure because she said she did not count them. She said that it has been an on going thing with [facility] and that she had already discussed it with the pharmacy and the pharmacy seems to tell her that they gave her the correct amount and that she can't get it filled due to insurance purposes. Pt would like to know if it would be ok for her to go without until next refill period or what you recommed for her to do? Please advise

## 2013-03-21 NOTE — Telephone Encounter (Signed)
Pt states she went to get a refill and pharmacy states she cannot get it refilled until 04/20/13. Please call

## 2013-03-22 ENCOUNTER — Telehealth: Payer: Self-pay

## 2013-03-22 ENCOUNTER — Other Ambulatory Visit: Payer: Self-pay

## 2013-03-22 NOTE — Telephone Encounter (Signed)
See below

## 2013-03-22 NOTE — Telephone Encounter (Signed)
error 

## 2013-03-22 NOTE — Telephone Encounter (Signed)
Please Disregard message below. Pt returned call and mentioned that she didn't realize that she does have correct amount of Losartan she found the correct bottle. She said she apologies and sorry for the confusion.

## 2013-03-22 NOTE — Telephone Encounter (Signed)
Anselm Pancoast, CMA at 03/22/2013 8:35 AM   Status: Signed            See below.        Britt Bottom, CMA at 03/22/2013 8:30 AM    Status: Signed             Please Disregard message below. Pt returned call and mentioned that she didn't realize that she does have correct amount of Losartan she found the correct bottle. She said she apologies and sorry for the confusion.        Britt Bottom, CMA at 03/21/2013 4:38 PM    Status: Signed             -Spoke with [facility] and they mentioned that pt had a 90 day supply of Losartan 100 mg refilled on Feb 16, 2013 and they said that pt should have enough medication to last her until her next refill. Pt's insurance will not pay for medication bc it is to soon for her to have Rx refilled.  -Spoke with pt today and she mentioned that she has 1 pill left for tomorrow. She has been taking Losartan once a day. She seems to believe that she was shorted pills but is not sure because she said she did not count them. She said that it has been an on going thing with [facility] and that she had already discussed it with the pharmacy and the pharmacy seems to tell her that they gave her the correct amount and that she can't get it filled due to insurance purposes. Pt would like to know if it would be ok for her to go without until next refill period or what you recommed for her to do? Please advise        Lucienne Minks at 03/21/2013 2:37 PM    Status: Signed             Pt states she went to get a refill and pharmacy states she cannot get it refilled until 04/20/13. Please call

## 2013-06-30 ENCOUNTER — Other Ambulatory Visit: Payer: Self-pay | Admitting: Gastroenterology

## 2013-07-05 ENCOUNTER — Ambulatory Visit: Payer: Self-pay | Admitting: Oncology

## 2013-07-06 ENCOUNTER — Ambulatory Visit: Payer: Self-pay | Admitting: Oncology

## 2013-07-06 LAB — CREATININE, SERUM: Creatinine: 0.82 mg/dL (ref 0.60–1.30)

## 2013-07-10 ENCOUNTER — Other Ambulatory Visit: Payer: Self-pay | Admitting: Cardiovascular Disease

## 2013-07-11 ENCOUNTER — Other Ambulatory Visit: Payer: Self-pay | Admitting: *Deleted

## 2013-07-11 MED ORDER — HYDRALAZINE HCL 25 MG PO TABS: 25.0000 mg | ORAL_TABLET | Freq: Two times a day (BID) | ORAL | Status: DC

## 2013-07-11 NOTE — Telephone Encounter (Signed)
Refilled Hydralazine sent to [facility].

## 2013-07-30 ENCOUNTER — Ambulatory Visit: Payer: Self-pay | Admitting: Oncology

## 2013-08-06 ENCOUNTER — Other Ambulatory Visit: Payer: Self-pay | Admitting: Cardiovascular Disease

## 2013-08-08 ENCOUNTER — Other Ambulatory Visit: Payer: Self-pay | Admitting: *Deleted

## 2013-08-08 MED ORDER — AMLODIPINE BESYLATE 5 MG PO TABS: ORAL_TABLET | ORAL | Status: DC

## 2013-08-08 NOTE — Telephone Encounter (Signed)
Requested Prescriptions   Signed Prescriptions Disp Refills  . amLODipine (NORVASC) 5 MG tablet 60 tablet 3    Sig: TAKE ONE TABLET BY MOUTH TWICE DAILY    Authorizing Provider: Minna Merritts    Ordering User: Britt Bottom

## 2013-08-30 ENCOUNTER — Ambulatory Visit (INDEPENDENT_AMBULATORY_CARE_PROVIDER_SITE_OTHER): Payer: Medicare Other | Admitting: Cardiovascular Disease

## 2013-08-30 ENCOUNTER — Encounter: Payer: Self-pay | Admitting: Cardiovascular Disease

## 2013-08-30 VITALS — BP 120/70 | HR 63 | Ht 64.0 in | Wt 132.5 lb

## 2013-08-30 DIAGNOSIS — I10 Essential (primary) hypertension: Secondary | ICD-10-CM

## 2013-08-30 DIAGNOSIS — I498 Other specified cardiac arrhythmias: Secondary | ICD-10-CM

## 2013-08-30 DIAGNOSIS — R001 Bradycardia, unspecified: Secondary | ICD-10-CM

## 2013-08-30 NOTE — Assessment & Plan Note (Signed)
Blood pressure is well controlled on today's visit. No changes made to the medications. 

## 2013-08-30 NOTE — Progress Notes (Signed)
Patient ID: Sabrina Hall, female    DOB: [birth year], 79 y.o.   MRN: [id number]  HPI Comments: Sabrina Hall is a pleasant 79 yo woman, patient of Dr. Kary Kos who currently lives at [facility] with a long history of hypertension, Crohn's disease, history of dermatologic issues, hypertension and trials on various medications, smoking history for 30 years, quit in 1989, who presents for routine followup  She reports that she has been doing well. She  participated in a [employer] course at [facility]. She does some activity, not particularly a aggressive exercise regimen. She reports that her blood pressure has been well-controlled when she checks this periodically.  In the past, we suggested decreasing her metoprolol for bradycardia. She continues to take  50 mg twice a day.  she does have chronic fatigue,  though otherwise has no other symptoms.    previous  lab work shows total cholesterol 167, LDL 72, HDL 55 and 2012 , Normal renal function with creatinine 0.7  EKG shows normal sinus rhythm with rate 63 beats per minute, no significant ST or T wave changes Family history; father died of stroke and atherosclerosis age 77, other died 50 from cancer   Outpatient Encounter Prescriptions as of 08/30/2013  Medication Sig  . amLODipine (NORVASC) 5 MG tablet TAKE ONE TABLET BY MOUTH DAILY  . aspirin 81 MG tablet Take 81 mg by mouth daily.  . Cyanocobalamin (VITAMIN B 12 PO) Take 500 mcg by mouth daily.  . CycloSPORINE (RESTASIS OP) Apply to eye 2 (two) times daily.  . diazepam (VALIUM) 5 MG tablet Takes 1/2 to 1 tablet as needed.  . fish oil-omega-3 fatty acids 1000 MG capsule Take 1 g by mouth 2 (two) times daily.  . hydrALAZINE (APRESOLINE) 25 MG tablet Take 1 tablet (25 mg total) by mouth 2 (two) times daily.  Marland Kitchen loperamide (IMODIUM) 2 MG capsule Take 2 mg by mouth 2 (two) times daily.   Marland Kitchen losartan (COZAAR) 100 MG tablet Take 100 mg by mouth daily.  . MERCAPTOPURINE PO 50 mg. Takes 1 /1/2  tablet daily.  . metoprolol (LOPRESSOR) 50 MG tablet TAKE ONE TABLET BY MOUTH TWICE DAILY  . Multiple Vitamin (MULTIVITAMIN) tablet Take 1 tablet by mouth daily.    Vladimir Faster Glycol-Propyl Glycol (SYSTANE OP) Apply to eye 4 (four) times daily.  . [DISCONTINUED] amLODipine (NORVASC) 5 MG tablet TAKE ONE TABLET BY MOUTH TWICE DAILY  . [DISCONTINUED] amLODipine (NORVASC) 5 MG tablet TAKE ONE TABLET BY MOUTH TWICE DAILY  . [DISCONTINUED] DIAZEPAM PO Take 2.5 mg by mouth 3 times/day as needed-between meals & bedtime.   . [DISCONTINUED] hydrALAZINE (APRESOLINE) 25 MG tablet TAKE ONE TABLET BY MOUTH THREE TIMES DAILY  . [DISCONTINUED] predniSONE (DELTASONE) 5 MG tablet TAKE ONE TABLET BY MOUTH EVERY OTHER DAY     Review of Systems  Constitutional: Positive for fatigue.  HENT: Negative.   Eyes: Negative.   Respiratory: Negative.   Cardiovascular: Negative.   Gastrointestinal: Negative.   Musculoskeletal: Negative.   Skin: Negative.   Neurological: Negative.   Psychiatric/Behavioral: Negative.   All other systems reviewed and are negative.    BP 120/70  Pulse 63  Ht 5' 4"  (1.626 m)  Wt 132 lb 8 oz (60.102 kg)  BMI 22.73 kg/m2  Physical Exam  Nursing note and vitals reviewed. Constitutional: She is oriented to person, place, and time. She appears well-developed and well-nourished.  HENT:  Head: Normocephalic.  Nose: Nose normal.  Mouth/Throat: Oropharynx is clear  and moist.  Eyes: Conjunctivae are normal. Pupils are equal, round, and reactive to light.  Neck: Normal range of motion. Neck supple. No JVD present.  Cardiovascular: Normal rate, regular rhythm, S1 normal, S2 normal, normal heart sounds and intact distal pulses.  Exam reveals no gallop and no friction rub.   No murmur heard. Pulmonary/Chest: Effort normal and breath sounds normal. No respiratory distress. She has no wheezes. She has no rales. She exhibits no tenderness.  Abdominal: Soft. Bowel sounds are normal. She  exhibits no distension. There is no tenderness.  Musculoskeletal: Normal range of motion. She exhibits no edema and no tenderness.  Lymphadenopathy:    She has no cervical adenopathy.  Neurological: She is alert and oriented to person, place, and time. Coordination normal.  Skin: Skin is warm and dry. No rash noted. No erythema.  Psychiatric: She has a normal mood and affect. Her behavior is normal. Judgment and thought content normal.    Assessment and Plan

## 2013-08-30 NOTE — Assessment & Plan Note (Signed)
We have suggested she monitor her heart rate. She could cut her metoprolol in half for bradycardia

## 2013-08-30 NOTE — Patient Instructions (Signed)
You are doing well. No medication changes were made.  Please call us if you have new issues that need to be addressed before your next appt.  Your physician wants you to follow-up in: 12 months.  You will receive a reminder letter in the mail two months in advance. If you don't receive a letter, please call our office to schedule the follow-up appointment. 

## 2013-09-16 ENCOUNTER — Other Ambulatory Visit: Payer: Self-pay | Admitting: Cardiovascular Disease

## 2013-09-19 ENCOUNTER — Other Ambulatory Visit: Payer: Self-pay | Admitting: *Deleted

## 2013-09-19 MED ORDER — METOPROLOL TARTRATE 50 MG PO TABS: ORAL_TABLET | ORAL | Status: DC

## 2013-09-19 NOTE — Telephone Encounter (Signed)
Requested Prescriptions   Signed Prescriptions Disp Refills  . metoprolol (LOPRESSOR) 50 MG tablet 60 tablet 3    Sig: TAKE ONE TABLET BY MOUTH TWICE DAILY    Authorizing Provider: Minna Merritts    Ordering User: Britt Bottom

## 2013-10-07 ENCOUNTER — Emergency Department: Payer: Self-pay | Admitting: Emergency Medicine

## 2013-10-07 LAB — CBC WITH DIFFERENTIAL/PLATELET
BASOS ABS: 0.1 10*3/uL (ref 0.0–0.1)
Basophil %: 1.1 %
EOS PCT: 2.2 %
Eosinophil #: 0.1 10*3/uL (ref 0.0–0.7)
HCT: 43.8 % (ref 35.0–47.0)
HGB: 15.1 g/dL (ref 12.0–16.0)
LYMPHS ABS: 0.9 10*3/uL — AB (ref 1.0–3.6)
LYMPHS PCT: 13.8 %
MCH: 32.2 pg (ref 26.0–34.0)
MCHC: 34.5 g/dL (ref 32.0–36.0)
MCV: 93 fL (ref 80–100)
MONO ABS: 0.6 x10 3/mm (ref 0.2–0.9)
MONOS PCT: 9.9 %
NEUTROS PCT: 73 %
Neutrophil #: 4.7 10*3/uL (ref 1.4–6.5)
PLATELETS: 129 10*3/uL — AB (ref 150–440)
RBC: 4.69 10*6/uL (ref 3.80–5.20)
RDW: 14 % (ref 11.5–14.5)
WBC: 6.5 10*3/uL (ref 3.6–11.0)

## 2013-10-07 LAB — BASIC METABOLIC PANEL
ANION GAP: 3 — AB (ref 7–16)
BUN: 14 mg/dL (ref 7–18)
CO2: 30 mmol/L (ref 21–32)
Calcium, Total: 9.3 mg/dL (ref 8.5–10.1)
Chloride: 99 mmol/L (ref 98–107)
Creatinine: 0.85 mg/dL (ref 0.60–1.30)
EGFR (African American): >60
EGFR (Non-African Amer.): >60
GLUCOSE: 154 mg/dL — AB (ref 65–99)
OSMOLALITY: 268 (ref 275–301)
Potassium: 3.8 mmol/L (ref 3.5–5.1)
SODIUM: 132 mmol/L — AB (ref 136–145)

## 2013-10-21 ENCOUNTER — Ambulatory Visit: Payer: Self-pay | Admitting: Oncology

## 2013-10-24 ENCOUNTER — Ambulatory Visit: Payer: Self-pay | Admitting: Oncology

## 2013-10-24 LAB — COMPREHENSIVE METABOLIC PANEL
ALBUMIN: 3.5 g/dL (ref 3.4–5.0)
ALT: 24 U/L (ref 12–78)
AST: 18 U/L (ref 15–37)
Alkaline Phosphatase: 61 U/L
Anion Gap: 6 — ABNORMAL LOW (ref 7–16)
BUN: 17 mg/dL (ref 7–18)
Bilirubin,Total: 0.9 mg/dL (ref 0.2–1.0)
CHLORIDE: 99 mmol/L (ref 98–107)
CO2: 29 mmol/L (ref 21–32)
CREATININE: 0.82 mg/dL (ref 0.60–1.30)
Calcium, Total: 8.1 mg/dL — ABNORMAL LOW (ref 8.5–10.1)
EGFR (African American): >60
GLUCOSE: 148 mg/dL — AB (ref 65–99)
OSMOLALITY: 273 (ref 275–301)
POTASSIUM: 3.5 mmol/L (ref 3.5–5.1)
Sodium: 134 mmol/L — ABNORMAL LOW (ref 136–145)
TOTAL PROTEIN: 7.1 g/dL (ref 6.4–8.2)

## 2013-10-24 LAB — CBC CANCER CENTER
BASOS ABS: 0.1 x10 3/mm (ref 0.0–0.1)
BASOS PCT: 1 %
EOS ABS: 0.1 x10 3/mm (ref 0.0–0.7)
Eosinophil %: 2.2 %
HCT: 41.6 % (ref 35.0–47.0)
HGB: 14 g/dL (ref 12.0–16.0)
LYMPHS ABS: 0.6 x10 3/mm — AB (ref 1.0–3.6)
Lymphocyte %: 9.3 %
MCH: 31.5 pg (ref 26.0–34.0)
MCHC: 33.6 g/dL (ref 32.0–36.0)
MCV: 94 fL (ref 80–100)
MONOS PCT: 11.2 %
Monocyte #: 0.7 x10 3/mm (ref 0.2–0.9)
NEUTROS ABS: 4.7 x10 3/mm (ref 1.4–6.5)
Neutrophil %: 76.3 %
Platelet: 136 x10 3/mm — ABNORMAL LOW (ref 150–440)
RBC: 4.43 10*6/uL (ref 3.80–5.20)
RDW: 14.1 % (ref 11.5–14.5)
WBC: 6.2 x10 3/mm (ref 3.6–11.0)

## 2013-10-30 ENCOUNTER — Ambulatory Visit: Payer: Self-pay | Admitting: Oncology

## 2013-12-13 ENCOUNTER — Other Ambulatory Visit: Payer: Self-pay | Admitting: Cardiovascular Disease

## 2014-01-11 ENCOUNTER — Other Ambulatory Visit: Payer: Self-pay | Admitting: Cardiovascular Disease

## 2014-02-10 ENCOUNTER — Ambulatory Visit: Payer: Self-pay | Admitting: Oncology

## 2014-02-14 ENCOUNTER — Ambulatory Visit: Payer: Self-pay | Admitting: Oncology

## 2014-02-14 LAB — CBC CANCER CENTER
BASOS ABS: 0.1 x10 3/mm (ref 0.0–0.1)
Basophil %: 0.9 %
EOS ABS: 0.2 x10 3/mm (ref 0.0–0.7)
Eosinophil %: 2.5 %
HCT: 39 % (ref 35.0–47.0)
HGB: 13.4 g/dL (ref 12.0–16.0)
Lymphocyte #: 0.9 x10 3/mm — ABNORMAL LOW (ref 1.0–3.6)
Lymphocyte %: 15.1 %
MCH: 31.4 pg (ref 26.0–34.0)
MCHC: 34.5 g/dL (ref 32.0–36.0)
MCV: 91 fL (ref 80–100)
Monocyte #: 0.5 x10 3/mm (ref 0.2–0.9)
Monocyte %: 8.7 %
NEUTROS ABS: 4.4 x10 3/mm (ref 1.4–6.5)
Neutrophil %: 72.8 %
Platelet: 176 x10 3/mm (ref 150–440)
RBC: 4.27 10*6/uL (ref 3.80–5.20)
RDW: 12.9 % (ref 11.5–14.5)
WBC: 6.1 x10 3/mm (ref 3.6–11.0)

## 2014-02-14 LAB — COMPREHENSIVE METABOLIC PANEL
Albumin: 3.4 g/dL (ref 3.4–5.0)
Alkaline Phosphatase: 96 U/L
Anion Gap: 6 — ABNORMAL LOW (ref 7–16)
BILIRUBIN TOTAL: 0.7 mg/dL (ref 0.2–1.0)
BUN: 19 mg/dL — ABNORMAL HIGH (ref 7–18)
CALCIUM: 8.6 mg/dL (ref 8.5–10.1)
CO2: 31 mmol/L (ref 21–32)
Chloride: 104 mmol/L (ref 98–107)
Creatinine: 0.85 mg/dL (ref 0.60–1.30)
EGFR (African American): >60
EGFR (Non-African Amer.): >60
Glucose: 101 mg/dL — ABNORMAL HIGH (ref 65–99)
Osmolality: 284 (ref 275–301)
Potassium: 3.7 mmol/L (ref 3.5–5.1)
SGOT(AST): 309 U/L — ABNORMAL HIGH (ref 15–37)
SGPT (ALT): 207 U/L — ABNORMAL HIGH (ref 12–78)
Sodium: 141 mmol/L (ref 136–145)
Total Protein: 7.5 g/dL (ref 6.4–8.2)

## 2014-02-27 ENCOUNTER — Ambulatory Visit: Payer: Self-pay | Admitting: Oncology

## 2014-03-21 ENCOUNTER — Ambulatory Visit: Payer: Self-pay | Admitting: Gastroenterology

## 2014-03-26 ENCOUNTER — Other Ambulatory Visit: Payer: Self-pay | Admitting: Cardiovascular Disease

## 2014-05-17 ENCOUNTER — Ambulatory Visit: Payer: Self-pay | Admitting: Oncology

## 2014-05-17 LAB — COMPREHENSIVE METABOLIC PANEL
Albumin: 3.5 g/dL (ref 3.4–5.0)
Alkaline Phosphatase: 67 U/L
Anion Gap: 8 (ref 7–16)
BILIRUBIN TOTAL: 0.6 mg/dL (ref 0.2–1.0)
BUN: 15 mg/dL (ref 7–18)
CHLORIDE: 100 mmol/L (ref 98–107)
CO2: 31 mmol/L (ref 21–32)
Calcium, Total: 8.6 mg/dL (ref 8.5–10.1)
Creatinine: 0.97 mg/dL (ref 0.60–1.30)
GFR CALC NON AF AMER: 52 — AB
Glucose: 176 mg/dL — ABNORMAL HIGH (ref 65–99)
OSMOLALITY: 283 (ref 275–301)
POTASSIUM: 4 mmol/L (ref 3.5–5.1)
SGOT(AST): 20 U/L (ref 15–37)
SGPT (ALT): 29 U/L
SODIUM: 139 mmol/L (ref 136–145)
Total Protein: 7.4 g/dL (ref 6.4–8.2)

## 2014-05-17 LAB — URINALYSIS, COMPLETE
BACTERIA: NONE SEEN
Bilirubin,UR: NEGATIVE
Blood: NEGATIVE
Glucose,UR: NEGATIVE mg/dL (ref 0–75)
KETONE: NEGATIVE
NITRITE: NEGATIVE
Ph: 6 (ref 4.5–8.0)
RBC,UR: 4 /HPF (ref 0–5)
Specific Gravity: 1.012 (ref 1.003–1.030)
Squamous Epithelial: <1
Transitional Epi: 1
WBC UR: 20 /HPF (ref 0–5)

## 2014-05-17 LAB — CBC CANCER CENTER
Basophil #: 0.1 x10 3/mm (ref 0.0–0.1)
Basophil %: 1 %
Eosinophil #: 0.2 x10 3/mm (ref 0.0–0.7)
Eosinophil %: 2.8 %
HCT: 43.7 % (ref 35.0–47.0)
HGB: 14.7 g/dL (ref 12.0–16.0)
LYMPHS ABS: 1.1 x10 3/mm (ref 1.0–3.6)
Lymphocyte %: 16.8 %
MCH: 30.8 pg (ref 26.0–34.0)
MCHC: 33.6 g/dL (ref 32.0–36.0)
MCV: 92 fL (ref 80–100)
Monocyte #: 0.6 x10 3/mm (ref 0.2–0.9)
Monocyte %: 9.7 %
Neutrophil #: 4.4 x10 3/mm (ref 1.4–6.5)
Neutrophil %: 69.7 %
PLATELETS: 205 x10 3/mm (ref 150–440)
RBC: 4.76 10*6/uL (ref 3.80–5.20)
RDW: 13.2 % (ref 11.5–14.5)
WBC: 6.3 x10 3/mm (ref 3.6–11.0)

## 2014-05-18 LAB — URINE CULTURE

## 2014-05-25 ENCOUNTER — Emergency Department: Payer: Self-pay | Admitting: Emergency Medicine

## 2014-05-25 LAB — COMPREHENSIVE METABOLIC PANEL
ALBUMIN: 3.8 g/dL (ref 3.4–5.0)
AST: 22 U/L (ref 15–37)
Alkaline Phosphatase: 64 U/L
Anion Gap: 8 (ref 7–16)
BILIRUBIN TOTAL: 1 mg/dL (ref 0.2–1.0)
BUN: 16 mg/dL (ref 7–18)
CALCIUM: 9.3 mg/dL (ref 8.5–10.1)
Chloride: 100 mmol/L (ref 98–107)
Co2: 29 mmol/L (ref 21–32)
Creatinine: 0.87 mg/dL (ref 0.60–1.30)
EGFR (Non-African Amer.): 59 — ABNORMAL LOW
Glucose: 112 mg/dL — ABNORMAL HIGH (ref 65–99)
Osmolality: 276 (ref 275–301)
Potassium: 3.8 mmol/L (ref 3.5–5.1)
SGPT (ALT): 26 U/L
SODIUM: 137 mmol/L (ref 136–145)
Total Protein: 8.2 g/dL (ref 6.4–8.2)

## 2014-05-25 LAB — URINALYSIS, COMPLETE
BILIRUBIN, UR: NEGATIVE
BLOOD: NEGATIVE
Glucose,UR: NEGATIVE mg/dL (ref 0–75)
KETONE: NEGATIVE
Nitrite: NEGATIVE
PH: 6 (ref 4.5–8.0)
Protein: 30
SQUAMOUS EPITHELIAL: NONE SEEN
Specific Gravity: 1.009 (ref 1.003–1.030)
WBC UR: 5 /HPF (ref 0–5)

## 2014-05-25 LAB — CBC WITH DIFFERENTIAL/PLATELET
Basophil #: 0.1 10*3/uL (ref 0.0–0.1)
Basophil %: 0.9 %
EOS ABS: 0.1 10*3/uL (ref 0.0–0.7)
Eosinophil %: 1.9 %
HCT: 46.7 % (ref 35.0–47.0)
HGB: 15.3 g/dL (ref 12.0–16.0)
Lymphocyte #: 1.1 10*3/uL (ref 1.0–3.6)
Lymphocyte %: 18.2 %
MCH: 30.5 pg (ref 26.0–34.0)
MCHC: 32.8 g/dL (ref 32.0–36.0)
MCV: 93 fL (ref 80–100)
MONO ABS: 0.7 x10 3/mm (ref 0.2–0.9)
Monocyte %: 10.6 %
NEUTROS ABS: 4.3 10*3/uL (ref 1.4–6.5)
NEUTROS PCT: 68.4 %
Platelet: 198 10*3/uL (ref 150–440)
RBC: 5.02 10*6/uL (ref 3.80–5.20)
RDW: 13.4 % (ref 11.5–14.5)
WBC: 6.3 10*3/uL (ref 3.6–11.0)

## 2014-05-30 ENCOUNTER — Ambulatory Visit: Payer: Self-pay | Admitting: Oncology

## 2014-06-20 ENCOUNTER — Other Ambulatory Visit: Payer: Self-pay | Admitting: Cardiovascular Disease

## 2014-06-29 ENCOUNTER — Ambulatory Visit: Payer: Self-pay | Admitting: Oncology

## 2014-07-18 ENCOUNTER — Other Ambulatory Visit: Payer: Self-pay | Admitting: Cardiovascular Disease

## 2014-08-04 IMAGING — CT CT OUTSIDE FILMS BODY
2 series · 16 of 42 positions shown, 20 images · IV contrast (OMNIPAQUE 350)
Comparison: none

[Series 2: abd-pel with 5.0 soft tissue · axial · 0.74mm/px · z∈[+653,+1013]mm · 13 of 81 slices shown, 16 images]
[im 6/81  soft-tissue]
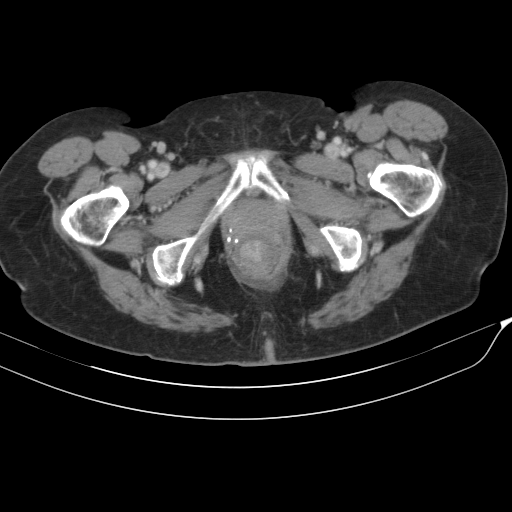
[im 6/81  bone]
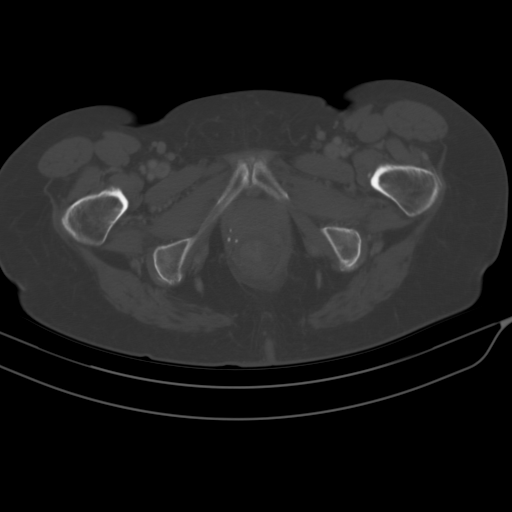
[im 13/81  soft-tissue]
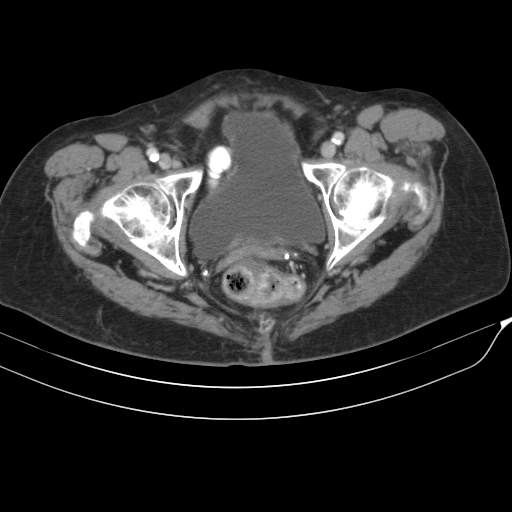
[im 21/81  soft-tissue]
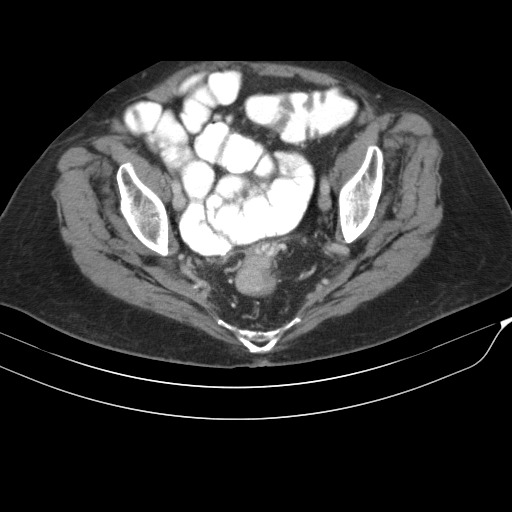
[im 29/81  soft-tissue]
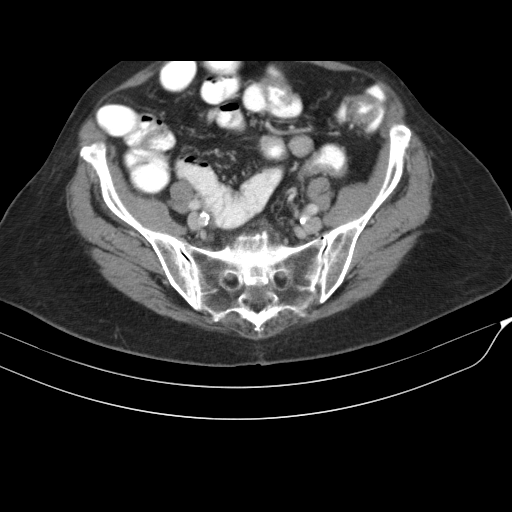
[im 37/81  soft-tissue]
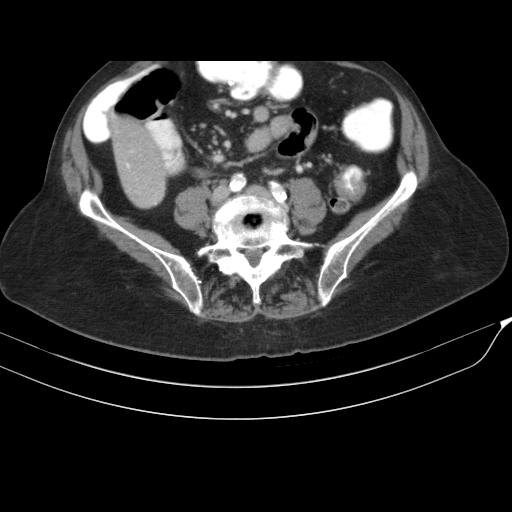
[im 44/81  soft-tissue]
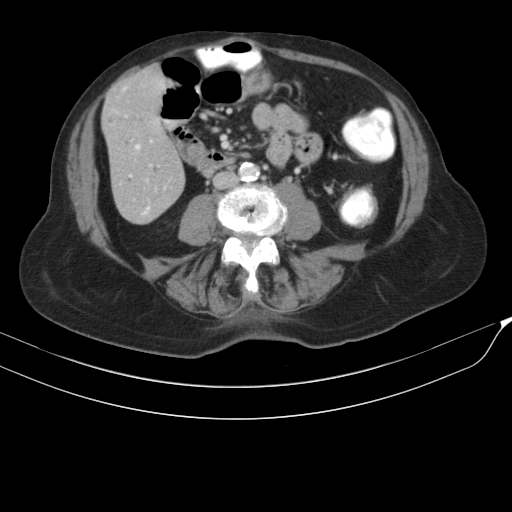
[im 52/81  soft-tissue]
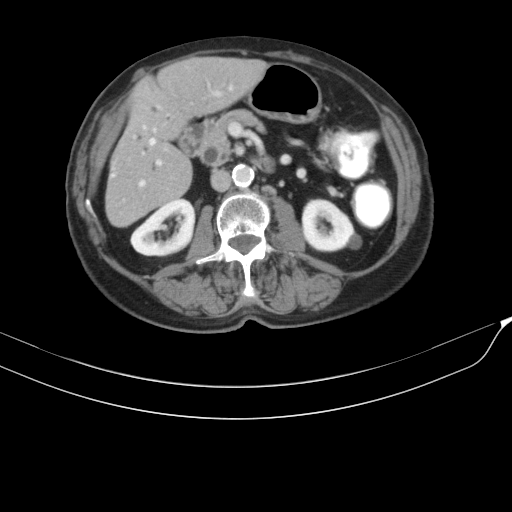
[im 60/81  soft-tissue]
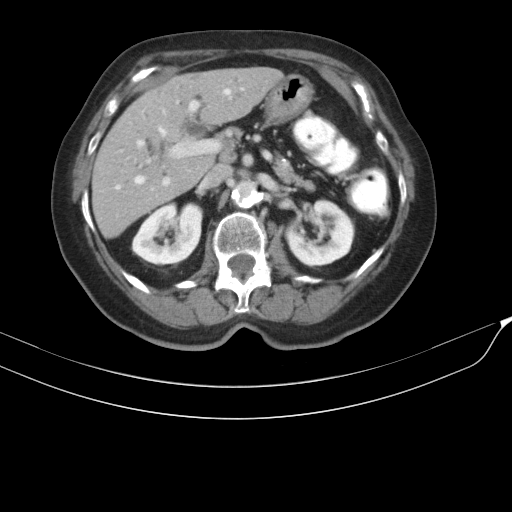
[im 68/81  soft-tissue]
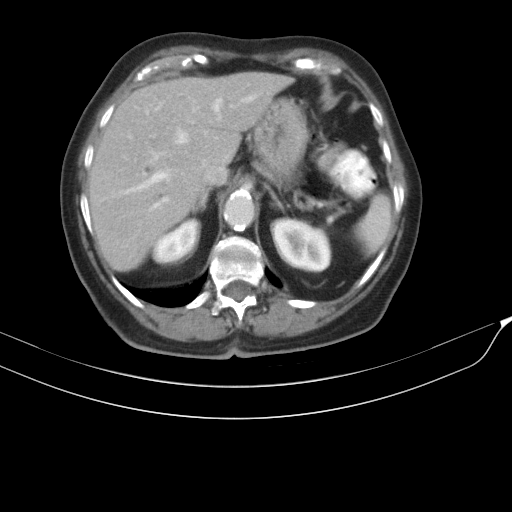
[im 68/81  bone]
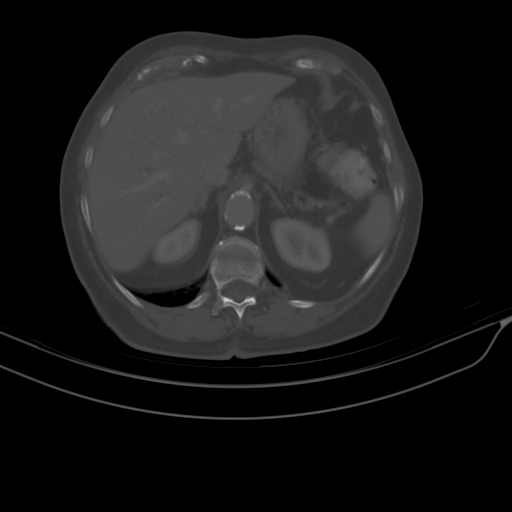
[im 70/81  lung]
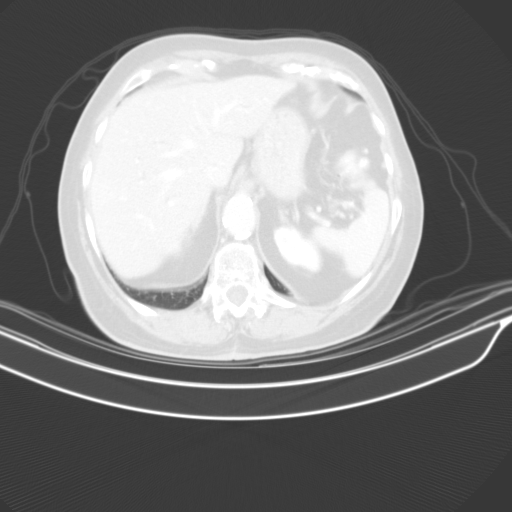
[im 73/81  lung]
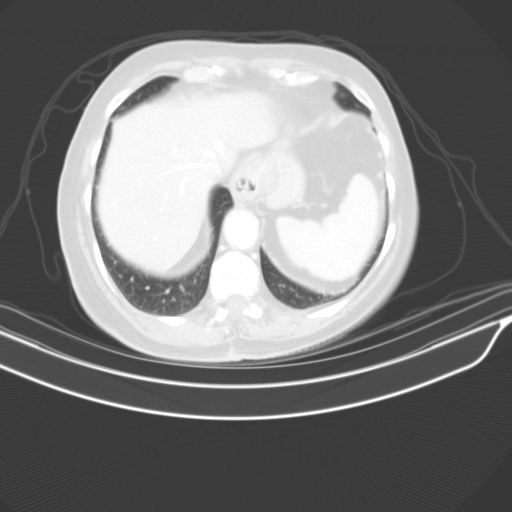
[im 75/81  soft-tissue]
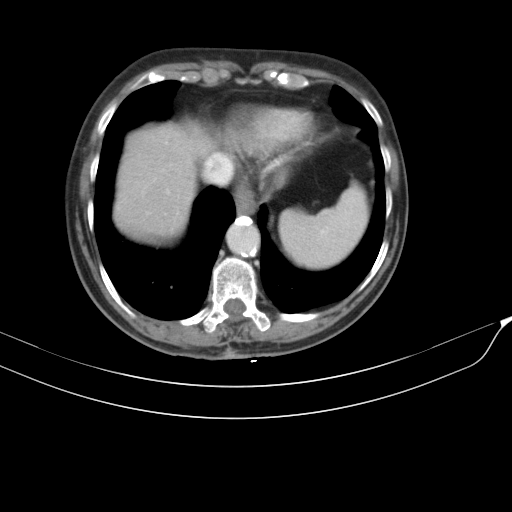
[im 75/81  lung]
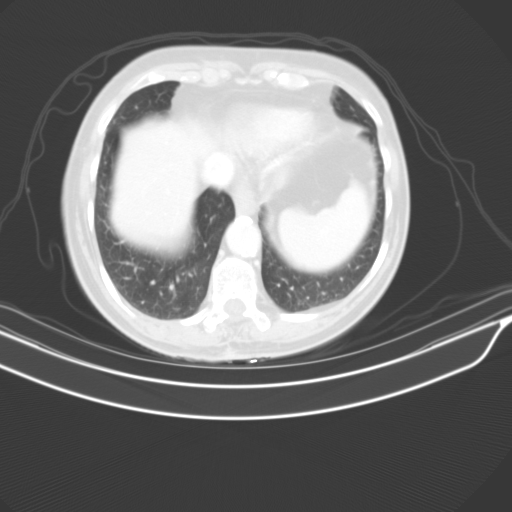
[im 78/81  lung]
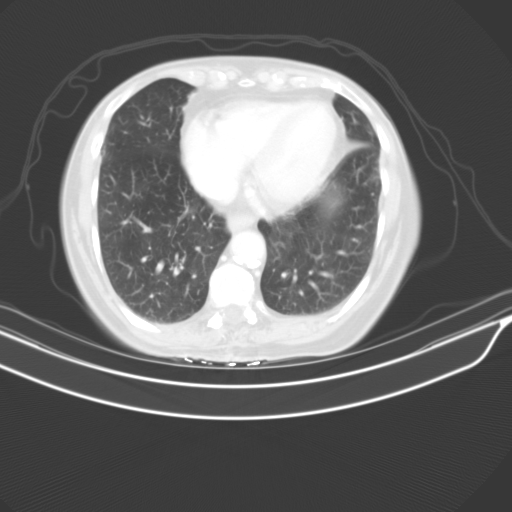

[Series 602: 5x3 coronal · coronal · 0.80mm/px · 3 of 78 slices shown, 4 images]
[im 26/78  soft-tissue]
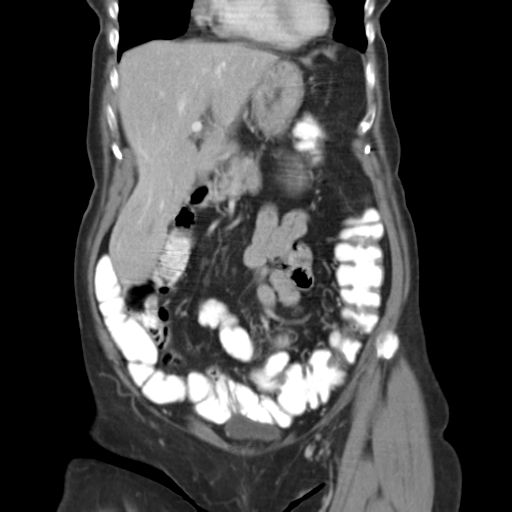
[im 35/78  soft-tissue]
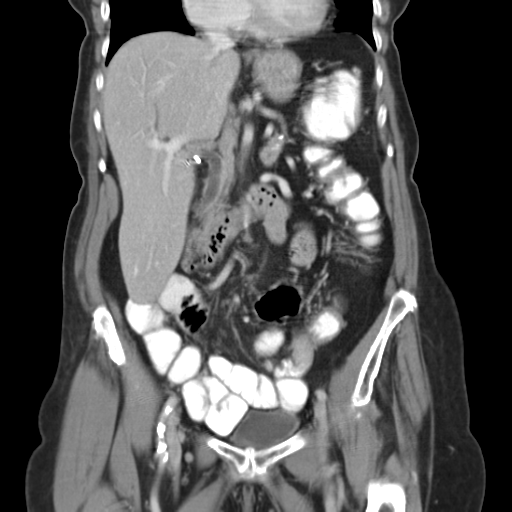
[im 35/78  bone]
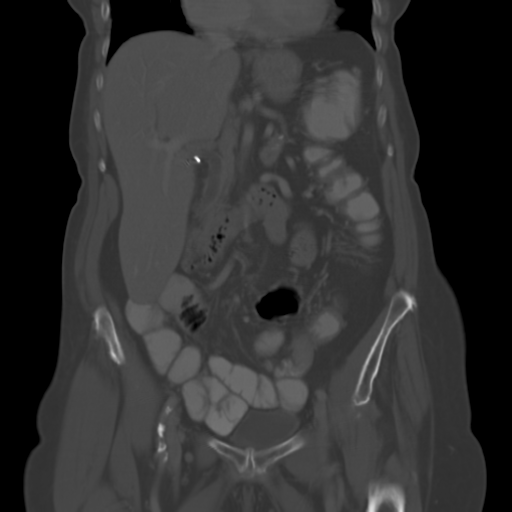
[im 43/78  soft-tissue]
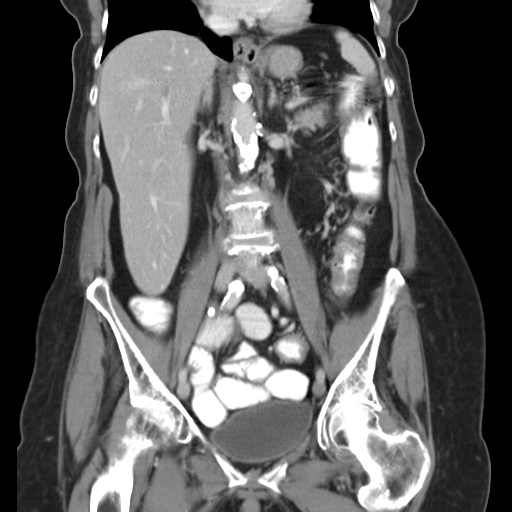

[16 of 42 positions shown; findings below may reference images not displayed]

IMAGES IMPORTED FROM THE SYNGO WORKFLOW SYSTEM
NO DICTATION FOR STUDY

## 2014-08-30 ENCOUNTER — Encounter: Payer: Self-pay | Admitting: Cardiovascular Disease

## 2014-08-30 ENCOUNTER — Ambulatory Visit (INDEPENDENT_AMBULATORY_CARE_PROVIDER_SITE_OTHER): Payer: Medicare Other | Admitting: Cardiovascular Disease

## 2014-08-30 VITALS — BP 130/70 | HR 53 | Ht 63.0 in | Wt 123.5 lb

## 2014-08-30 DIAGNOSIS — K5090 Crohn's disease, unspecified, without complications: Secondary | ICD-10-CM

## 2014-08-30 DIAGNOSIS — I10 Essential (primary) hypertension: Secondary | ICD-10-CM

## 2014-08-30 DIAGNOSIS — R001 Bradycardia, unspecified: Secondary | ICD-10-CM

## 2014-08-30 NOTE — Progress Notes (Signed)
Patient ID: Sabrina Hall, female    DOB: 1923/04/19, 80 y.o.   MRN: [id number]  HPI Comments: Sabrina Hall is a pleasant 80 yo woman, patient of Dr. Kary Kos who currently lives at [facility] with a long history of hypertension, Crohn's disease, history of dermatologic issues, smoking history for 30 years, quit in 1989, who presents for routine follow-up of her blood pressure  In general she reports that she is doing well. No regular exercise but denies having any shortness of breath or chest discomfort with exertion. She stopped taking mercaptopurine on her own as the price went up to $45 per month She continues to take amlodipine, losartan, metoprolol, hydralazine. No side effects  EKG on today's visit shows normal sinus rhythm with rate 53 bpm, no significant ST or T-wave changes   previous  lab work shows total cholesterol 167, LDL 72, HDL 55 and 2012 , Normal renal function with creatinine 0.7  Family history; father died of stroke and atherosclerosis age 52, other died 45 from cancer   Outpatient Encounter Prescriptions as of 08/30/2014  Medication Sig  . amLODipine (NORVASC) 5 MG tablet TAKE ONE TABLET BY MOUTH DAILY  . amLODipine (NORVASC) 5 MG tablet TAKE ONE TABLET BY MOUTH TWICE DAILY  . aspirin 81 MG tablet Take 81 mg by mouth daily.  . Cyanocobalamin (VITAMIN B 12 PO) Take 500 mcg by mouth daily.  . CycloSPORINE (RESTASIS OP) Apply to eye 2 (two) times daily.  . diazepam (VALIUM) 5 MG tablet Takes 1/2 to 1 tablet as needed.  . fish oil-omega-3 fatty acids 1000 MG capsule Take 1 g by mouth 2 (two) times daily.  . hydrALAZINE (APRESOLINE) 25 MG tablet TAKE ONE TABLET BY MOUTH TWICE DAILY  . loperamide (IMODIUM) 2 MG capsule Take 2 mg by mouth 2 (two) times daily.   Marland Kitchen losartan (COZAAR) 100 MG tablet Take 100 mg by mouth daily.  . metoprolol (LOPRESSOR) 50 MG tablet TAKE ONE TABLET BY MOUTH TWICE DAILY  . metoprolol (LOPRESSOR) 50 MG tablet TAKE ONE TABLET BY MOUTH TWICE  DAILY  . Multiple Vitamin (MULTIVITAMIN) tablet Take 1 tablet by mouth daily.    Vladimir Faster Glycol-Propyl Glycol (SYSTANE OP) Apply to eye 4 (four) times daily.  . [DISCONTINUED] MERCAPTOPURINE PO 50 mg. Takes 1 /1/2 tablet daily.   Social history  reports that she quit smoking about 32 years ago. Her smoking use included Cigarettes. She smoked 0.50 packs per day. She does not have any smokeless tobacco history on file. She reports that she drinks alcohol. She reports that she does not use illicit drugs.  Review of Systems  Constitutional: Positive for fatigue.  Respiratory: Negative.   Cardiovascular: Negative.   Gastrointestinal: Negative.   Musculoskeletal: Negative.   Skin: Negative.   Neurological: Negative.   Psychiatric/Behavioral: Negative.   All other systems reviewed and are negative.    BP 130/70 mmHg  Pulse 53  Ht 5' 3"  (1.6 m)  Wt 123 lb 8 oz (56.019 kg)  BMI 21.88 kg/m2  Physical Exam  Constitutional: She is oriented to person, place, and time. She appears well-developed and well-nourished.  HENT:  Head: Normocephalic.  Nose: Nose normal.  Mouth/Throat: Oropharynx is clear and moist.  Eyes: Conjunctivae are normal. Pupils are equal, round, and reactive to light.  Neck: Normal range of motion. Neck supple. No JVD present.  Cardiovascular: Normal rate, regular rhythm, S1 normal, S2 normal, normal heart sounds and intact distal pulses.  Exam reveals no gallop  and no friction rub.   No murmur heard. Pulmonary/Chest: Effort normal and breath sounds normal. No respiratory distress. She has no wheezes. She has no rales. She exhibits no tenderness.  Abdominal: Soft. Bowel sounds are normal. She exhibits no distension. There is no tenderness.  Musculoskeletal: Normal range of motion. She exhibits no edema or tenderness.  Lymphadenopathy:    She has no cervical adenopathy.  Neurological: She is alert and oriented to person, place, and time. Coordination normal.  Skin:  Skin is warm and dry. No rash noted. No erythema.  Psychiatric: She has a normal mood and affect. Her behavior is normal. Judgment and thought content normal.    Assessment and Plan  Nursing note and vitals reviewed.

## 2014-08-30 NOTE — Assessment & Plan Note (Signed)
Blood pressure is well controlled on today's visit. No changes made to the medications. 

## 2014-08-30 NOTE — Assessment & Plan Note (Signed)
Asymptomatic bradycardia. No changes to her medications

## 2014-08-30 NOTE — Assessment & Plan Note (Signed)
She reports that she has stopped her mercaptopurine on her own secondary to the price. Currently with no GI symptoms. Recommended she follow-up with Dr. Kary Kos

## 2014-08-30 NOTE — Patient Instructions (Signed)
You are doing well. No medication changes were made.  Please call us if you have new issues that need to be addressed before your next appt.  Your physician wants you to follow-up in: 12 months.  You will receive a reminder letter in the mail two months in advance. If you don't receive a letter, please call our office to schedule the follow-up appointment. 

## 2014-09-07 ENCOUNTER — Ambulatory Visit: Payer: Self-pay | Admitting: Family Medicine

## 2014-10-07 ENCOUNTER — Emergency Department: Payer: Self-pay | Admitting: Emergency Medicine

## 2014-10-07 LAB — CBC WITH DIFFERENTIAL/PLATELET
Basophil #: 0 10*3/uL (ref 0.0–0.1)
Basophil %: 0.8 %
EOS ABS: 0.1 10*3/uL (ref 0.0–0.7)
Eosinophil %: 2.2 %
HCT: 45 % (ref 35.0–47.0)
HGB: 14.8 g/dL (ref 12.0–16.0)
LYMPHS ABS: 0.8 10*3/uL — AB (ref 1.0–3.6)
Lymphocyte %: 19.1 %
MCH: 30.7 pg (ref 26.0–34.0)
MCHC: 32.8 g/dL (ref 32.0–36.0)
MCV: 94 fL (ref 80–100)
MONO ABS: 0.5 x10 3/mm (ref 0.2–0.9)
Monocyte %: 12.3 %
NEUTROS ABS: 2.9 10*3/uL (ref 1.4–6.5)
Neutrophil %: 65.6 %
Platelet: 169 10*3/uL (ref 150–440)
RBC: 4.8 10*6/uL (ref 3.80–5.20)
RDW: 13.1 % (ref 11.5–14.5)
WBC: 4.4 10*3/uL (ref 3.6–11.0)

## 2014-10-07 LAB — COMPREHENSIVE METABOLIC PANEL
ALBUMIN: 3.4 g/dL (ref 3.4–5.0)
ALK PHOS: 69 U/L
ALT: 25 U/L
Anion Gap: 8 (ref 7–16)
BUN: 16 mg/dL (ref 7–18)
Bilirubin,Total: 0.6 mg/dL (ref 0.2–1.0)
CO2: 31 mmol/L (ref 21–32)
CREATININE: 0.96 mg/dL (ref 0.60–1.30)
Calcium, Total: 8.8 mg/dL (ref 8.5–10.1)
Chloride: 96 mmol/L — ABNORMAL LOW (ref 98–107)
EGFR (African American): >60
GFR CALC NON AF AMER: 58 — AB
GLUCOSE: 252 mg/dL — AB (ref 65–99)
OSMOLALITY: 280 (ref 275–301)
Potassium: 3.5 mmol/L (ref 3.5–5.1)
SGOT(AST): 29 U/L (ref 15–37)
Sodium: 135 mmol/L — ABNORMAL LOW (ref 136–145)
Total Protein: 7.5 g/dL (ref 6.4–8.2)

## 2014-10-07 LAB — URINALYSIS, COMPLETE
BLOOD: NEGATIVE
Bilirubin,UR: NEGATIVE
Glucose,UR: 150 mg/dL (ref 0–75)
Ketone: NEGATIVE
NITRITE: NEGATIVE
PH: 6 (ref 4.5–8.0)
Protein: 30
RBC,UR: 3 /HPF (ref 0–5)
SPECIFIC GRAVITY: 1.011 (ref 1.003–1.030)
Squamous Epithelial: 1
WBC UR: 7 /HPF (ref 0–5)

## 2014-10-27 ENCOUNTER — Other Ambulatory Visit: Payer: Self-pay | Admitting: Cardiovascular Disease

## 2014-10-28 ENCOUNTER — Other Ambulatory Visit: Payer: Self-pay | Admitting: Cardiovascular Disease

## 2014-12-04 ENCOUNTER — Emergency Department: Payer: Self-pay | Admitting: Emergency Medicine

## 2014-12-08 ENCOUNTER — Other Ambulatory Visit: Payer: Self-pay | Admitting: Cardiovascular Disease

## 2015-04-09 ENCOUNTER — Other Ambulatory Visit: Payer: Self-pay

## 2015-04-09 MED ORDER — HYDRALAZINE HCL 25 MG PO TABS: 25.0000 mg | ORAL_TABLET | Freq: Two times a day (BID) | ORAL | Status: DC

## 2015-04-09 MED ORDER — METOPROLOL TARTRATE 50 MG PO TABS: 50.0000 mg | ORAL_TABLET | Freq: Two times a day (BID) | ORAL | Status: DC

## 2015-04-09 NOTE — Telephone Encounter (Signed)
Refill sent for metoprolol tart 50 mg

## 2015-04-09 NOTE — Telephone Encounter (Signed)
Refill sent for hydralazine 25 mg bid

## 2015-08-25 ENCOUNTER — Other Ambulatory Visit: Payer: Self-pay | Admitting: Cardiovascular Disease

## 2015-09-28 ENCOUNTER — Ambulatory Visit: Payer: Self-pay | Admitting: Cardiovascular Disease

## 2015-11-21 ENCOUNTER — Ambulatory Visit: Payer: Self-pay | Admitting: Cardiovascular Disease

## 2015-12-07 ENCOUNTER — Ambulatory Visit (INDEPENDENT_AMBULATORY_CARE_PROVIDER_SITE_OTHER): Payer: Medicare Other | Admitting: Internal Medicine

## 2015-12-07 ENCOUNTER — Other Ambulatory Visit: Payer: Medicare Other

## 2015-12-07 ENCOUNTER — Encounter: Payer: Self-pay | Admitting: Internal Medicine

## 2015-12-07 VITALS — BP 128/76 | HR 52 | Temp 97.5°F | Ht 61.75 in | Wt 116.0 lb

## 2015-12-07 DIAGNOSIS — I10 Essential (primary) hypertension: Secondary | ICD-10-CM | POA: Diagnosis not present

## 2015-12-07 DIAGNOSIS — Z23 Encounter for immunization: Secondary | ICD-10-CM | POA: Diagnosis not present

## 2015-12-07 DIAGNOSIS — G479 Sleep disorder, unspecified: Secondary | ICD-10-CM | POA: Diagnosis not present

## 2015-12-07 DIAGNOSIS — K5000 Crohn's disease of small intestine without complications: Secondary | ICD-10-CM

## 2015-12-07 DIAGNOSIS — R001 Bradycardia, unspecified: Secondary | ICD-10-CM | POA: Diagnosis not present

## 2015-12-07 DIAGNOSIS — R7989 Other specified abnormal findings of blood chemistry: Secondary | ICD-10-CM

## 2015-12-07 LAB — CBC WITH DIFFERENTIAL/PLATELET
Basophils Absolute: 0.1 K/uL (ref 0.0–0.1)
Basophils Relative: 1 % (ref 0–1)
Eosinophils Absolute: 0.3 K/uL (ref 0.0–0.7)
Eosinophils Relative: 4 % (ref 0–5)
HCT: 42.6 % (ref 36.0–46.0)
Hemoglobin: 14.3 g/dL (ref 12.0–15.0)
Lymphocytes Relative: 21 % (ref 12–46)
Lymphs Abs: 1.4 K/uL (ref 0.7–4.0)
MCH: 30.4 pg (ref 26.0–34.0)
MCHC: 33.6 g/dL (ref 30.0–36.0)
MCV: 90.4 fL (ref 78.0–100.0)
Monocytes Absolute: 0.7 K/uL (ref 0.1–1.0)
Monocytes Relative: 10 % (ref 3–12)
Neutro Abs: 4.2 K/uL (ref 1.7–7.7)
Neutrophils Relative %: 64 % (ref 43–77)
RBC: 4.71 MIL/uL (ref 3.87–5.11)
RDW: 13.3 % (ref 11.5–15.5)
WBC: 6.6 K/uL (ref 4.0–10.5)

## 2015-12-07 LAB — T4, FREE: FREE T4: 1.04 ng/dL (ref 0.60–1.60)

## 2015-12-07 LAB — COMPREHENSIVE METABOLIC PANEL
ALT: 16 U/L (ref 0–35)
AST: 20 U/L (ref 0–37)
Albumin: 4.2 g/dL (ref 3.5–5.2)
Alkaline Phosphatase: 55 U/L (ref 39–117)
BILIRUBIN TOTAL: 1 mg/dL (ref 0.2–1.2)
BUN: 16 mg/dL (ref 6–23)
CO2: 31 meq/L (ref 19–32)
CREATININE: 0.73 mg/dL (ref 0.40–1.20)
Calcium: 9.5 mg/dL (ref 8.4–10.5)
Chloride: 99 mEq/L (ref 96–112)
GFR: 79.54 mL/min (ref >60.00)
GLUCOSE: 85 mg/dL (ref 70–99)
Potassium: 4.1 mEq/L (ref 3.5–5.1)
Sodium: 138 mEq/L (ref 135–145)
Total Protein: 7.4 g/dL (ref 6.0–8.3)

## 2015-12-07 MED ORDER — METOPROLOL TARTRATE 50 MG PO TABS: 25.0000 mg | ORAL_TABLET | Freq: Two times a day (BID) | ORAL | Status: DC

## 2015-12-07 NOTE — Patient Instructions (Addendum)
Please try cutting the diazepam in half and only take half at bedtime. Please cut the metoprolol in half and only take 1/2 tab twice a day.

## 2015-12-07 NOTE — Addendum Note (Signed)
Addended by: Pilar Grammes on: 12/07/2015 12:25 PM   Modules accepted: Orders

## 2015-12-07 NOTE — Assessment & Plan Note (Signed)
No symptoms but at her age, will wean and potentially stop the metoprolol. No CAD and BP has been good

## 2015-12-07 NOTE — Assessment & Plan Note (Signed)
Seems to be in remission AM urgency and chronic diarrhea but no worrisome features

## 2015-12-07 NOTE — Assessment & Plan Note (Signed)
BP Readings from Last 3 Encounters:  12/07/15 128/76  08/30/14 130/70  08/30/13 120/70   Will try weaning metoprolol

## 2015-12-07 NOTE — Progress Notes (Signed)
Subjective:    Patient ID: Sabrina Hall, female    DOB: [birth year], 82 y.o.   MRN: [id number]  HPI Here with daughter to [place] for 15 years I just started taking care of her husband at health care Looking to move into assisted living  HTN--- goes back 20 years or more Lower now than in the past No CAD No problems with meds History of asymptomatic bradycardia Now walks with a cane  Having significant back pain Mostly in AM---relieved by moving bowels Does have histroy of Crohn's and has chronic diarrhea No blood in stool No abdominal pain Appetite is good Hasn't seen GI in some time (formerly Dr Lyla Son)  Uses diazepam just to go to sleep Did have withdrawal trying to come off it in past This only gives her 4 hours sleep  Current Outpatient Prescriptions on File Prior to Visit  Medication Sig Dispense Refill  . amLODipine (NORVASC) 5 MG tablet TAKE ONE TABLET BY MOUTH TWICE DAILY 60 tablet 3  . CycloSPORINE (RESTASIS OP) Apply to eye 2 (two) times daily.    . diazepam (VALIUM) 5 MG tablet Takes 1/2 to 1 tablet as needed.    . fish oil-omega-3 fatty acids 1000 MG capsule Take 1 g by mouth 2 (two) times daily.     . hydrALAZINE (APRESOLINE) 25 MG tablet TAKE ONE TABLET BY MOUTH TWICE DAILY 60 tablet 3  . loperamide (IMODIUM) 2 MG capsule Take 2 mg by mouth 2 (two) times daily.     Marland Kitchen losartan (COZAAR) 100 MG tablet Take 100 mg by mouth daily.    . metoprolol (LOPRESSOR) 50 MG tablet TAKE ONE TABLET BY MOUTH TWICE DAILY 60 tablet 3  . Multiple Vitamin (MULTIVITAMIN) tablet Take 1 tablet by mouth daily.      Vladimir Faster Glycol-Propyl Glycol (SYSTANE OP) Apply to eye 4 (four) times daily.     No current facility-administered medications on file prior to visit.    Allergies  Allergen Reactions  . Lactose   . Maxzide [Hydrochlorothiazide W-Triamterene]   . Nsaids   . Penicillins   . Sulfonamide Derivatives     Past Medical History  Diagnosis  Date  . Hypertension   . Crohn's   . Arthritis   . History of shingles   . Eczema   . History of hemorrhoids   . Psoriasis   . Deviated septum   . H/O seasonal allergies   . Cataract     Past Surgical History  Procedure Laterality Date  . Ileocecostomy    . Neck surgery  2004    mass removed from cervical spine  . Cholecystectomy    . Hysterectomy    . Mohs surgery    . Basel cell carcinoma    . Nose surgery      basal cell carcinoma of nose  . Nasal septum surgery    . Cataract extraction      right eye    Family History  Problem Relation Age of Onset  . Stroke Father   . Cancer Neg Hx   . Diabetes Neg Hx     Social History   Social History  . Marital Status: Married    Spouse Name: N/A  . Number of Children: 2  . Years of Education: N/A   Occupational History  . Schoolteacher     retired   Social History Main Topics  . Smoking status: Former Smoker -- 0.50 packs/day  Types: Cigarettes    Quit date: 09/09/1981  . Smokeless tobacco: Not on file  . Alcohol Use: Yes     Comment: occassionally  . Drug Use: No  . Sexual Activity: Not on file   Other Topics Concern  . Not on file   Social History Narrative   Has living will    Daughter is health care POA   Has DNR order--this is confirmed at visit 12/07/15   No tube feeds if cognitively unaware   Review of Systems  Constitutional: Positive for fatigue. Negative for unexpected weight change.  HENT: Positive for hearing loss.        Uses hearing aides Partial dentures-- sees dentist  Respiratory: Negative for cough, chest tightness and shortness of breath.   Cardiovascular: Positive for leg swelling. Negative for palpitations.       Did have brief chest pain last week--seemed like gas. Very brief  Gastrointestinal: Positive for diarrhea. Negative for abdominal pain and blood in stool.  Genitourinary: Negative for dysuria and hematuria.  Musculoskeletal: Positive for back pain. Negative for  arthralgias.  Skin:       Hand rash--chronic. Using gold bond eczema lotion No suspicious lesions but has had many cancers in past   Neurological: Negative for dizziness, syncope, weakness and light-headedness.  Hematological: Negative for adenopathy. Does not bruise/bleed easily.  Psychiatric/Behavioral: Positive for sleep disturbance. Negative for dysphoric mood. The patient is not nervous/anxious.        Objective:   Physical Exam  Constitutional: She appears well-developed. No distress.  HENT:  Mouth/Throat: Oropharynx is clear and moist. No oropharyngeal exudate.  Neck: Normal range of motion. Neck supple. No thyromegaly present.  Cardiovascular: Normal rate, regular rhythm and normal heart sounds.  Exam reveals no gallop.   No murmur heard. Faint distal pulses  Pulmonary/Chest: Effort normal and breath sounds normal. No respiratory distress. She has no wheezes. She has no rales.  Abdominal: Soft. There is no tenderness.  Musculoskeletal: She exhibits no edema.  Lymphadenopathy:    She has no cervical adenopathy.  Skin: No rash noted. No erythema.  Psychiatric: She has a normal mood and affect. Her behavior is normal.          Assessment & Plan:

## 2015-12-07 NOTE — Assessment & Plan Note (Signed)
Diazepam for decades Withdrawal with wean attempt not long ago Will have her try to cut them in half--discussed safety concerns

## 2015-12-07 NOTE — Progress Notes (Signed)
Pre visit review using our clinic review tool, if applicable. No additional management support is needed unless otherwise documented below in the visit note. 

## 2016-01-04 IMAGING — CR SACRUM AND COCCYX - 2+ VIEW
1 series · 3 of 3 positions shown · non-contrast
Comparison: None.

CLINICAL DATA: Status post fall today with coccygeal pain. Initial
encounter.

EXAM:
SACRUM AND COCCYX - 2+ VIEW

[Series 1: dxr sacrum and coccyx · 0.14mm/px · 3 of 3 slices shown]
[im 1/3]
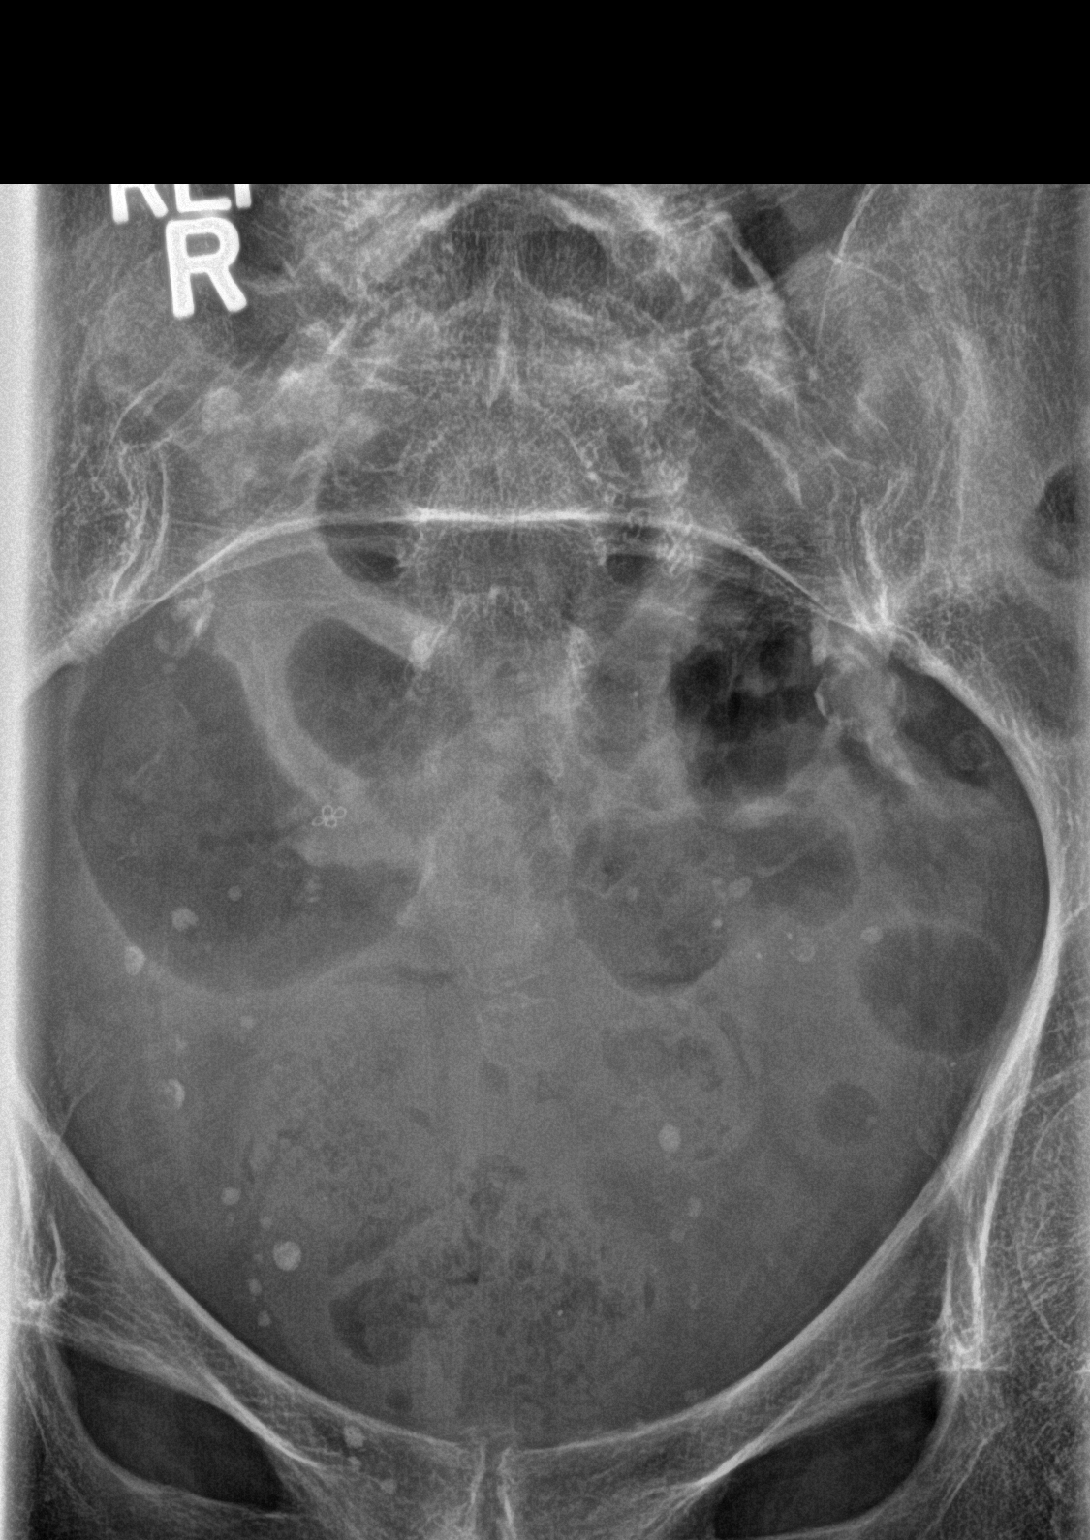
[im 2/3]
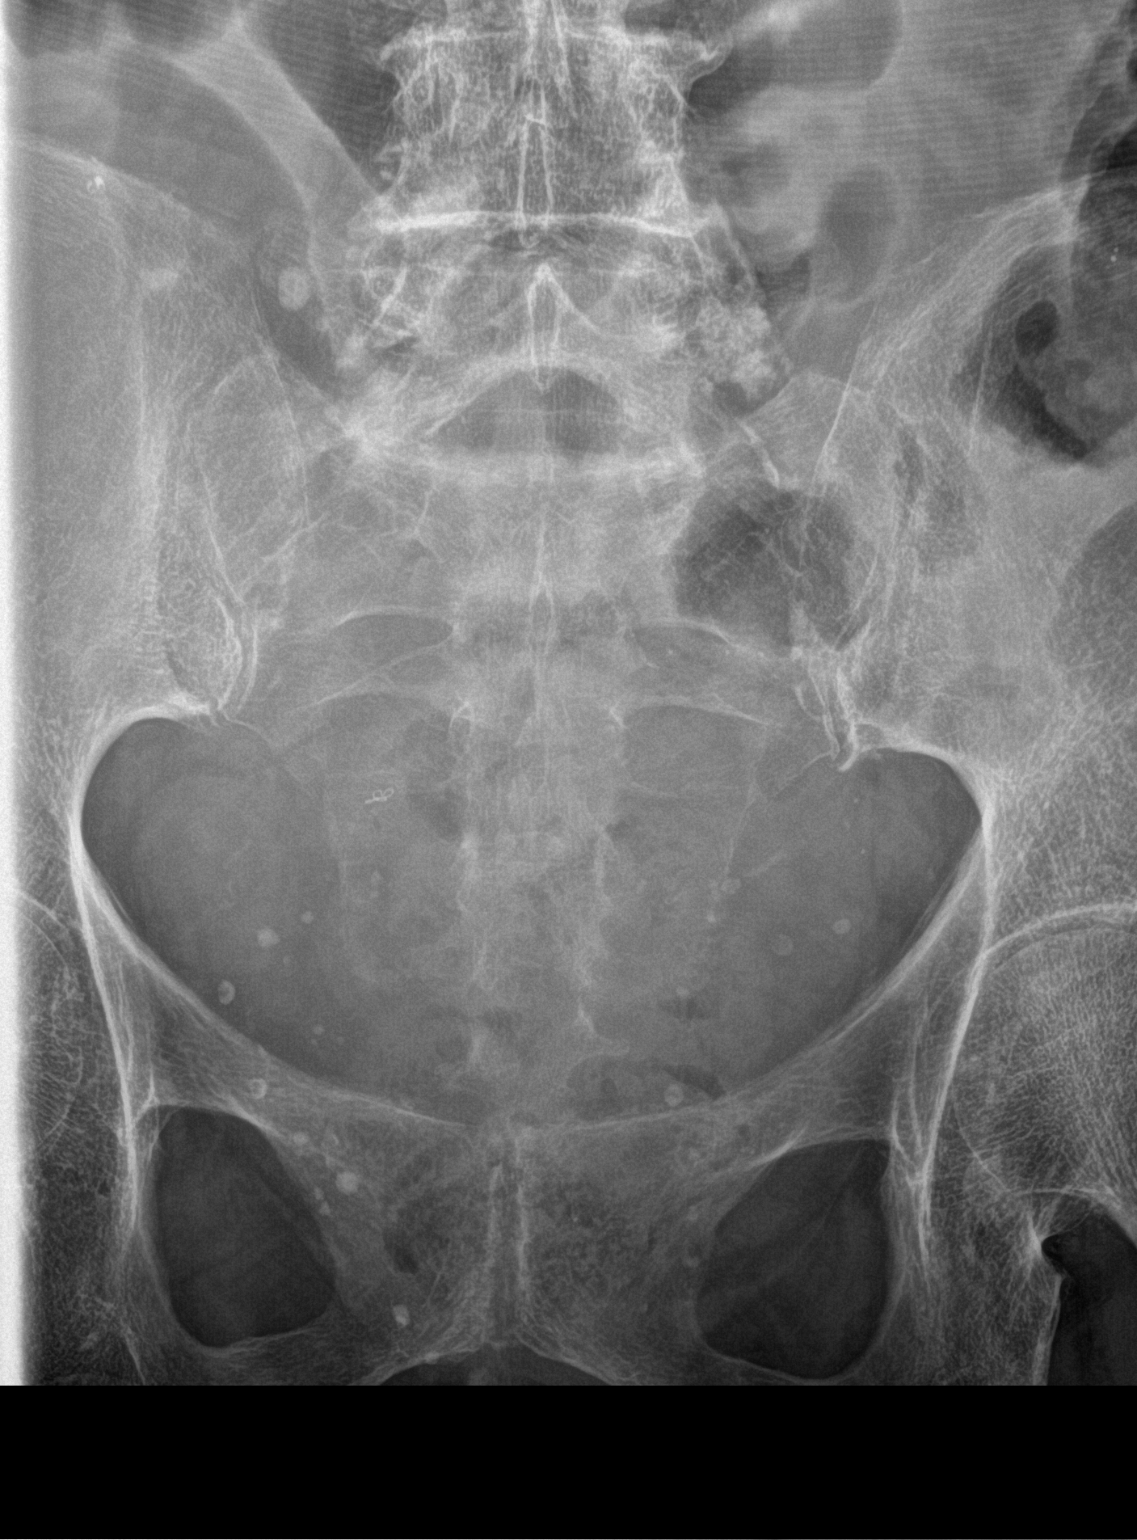
[im 3/3]
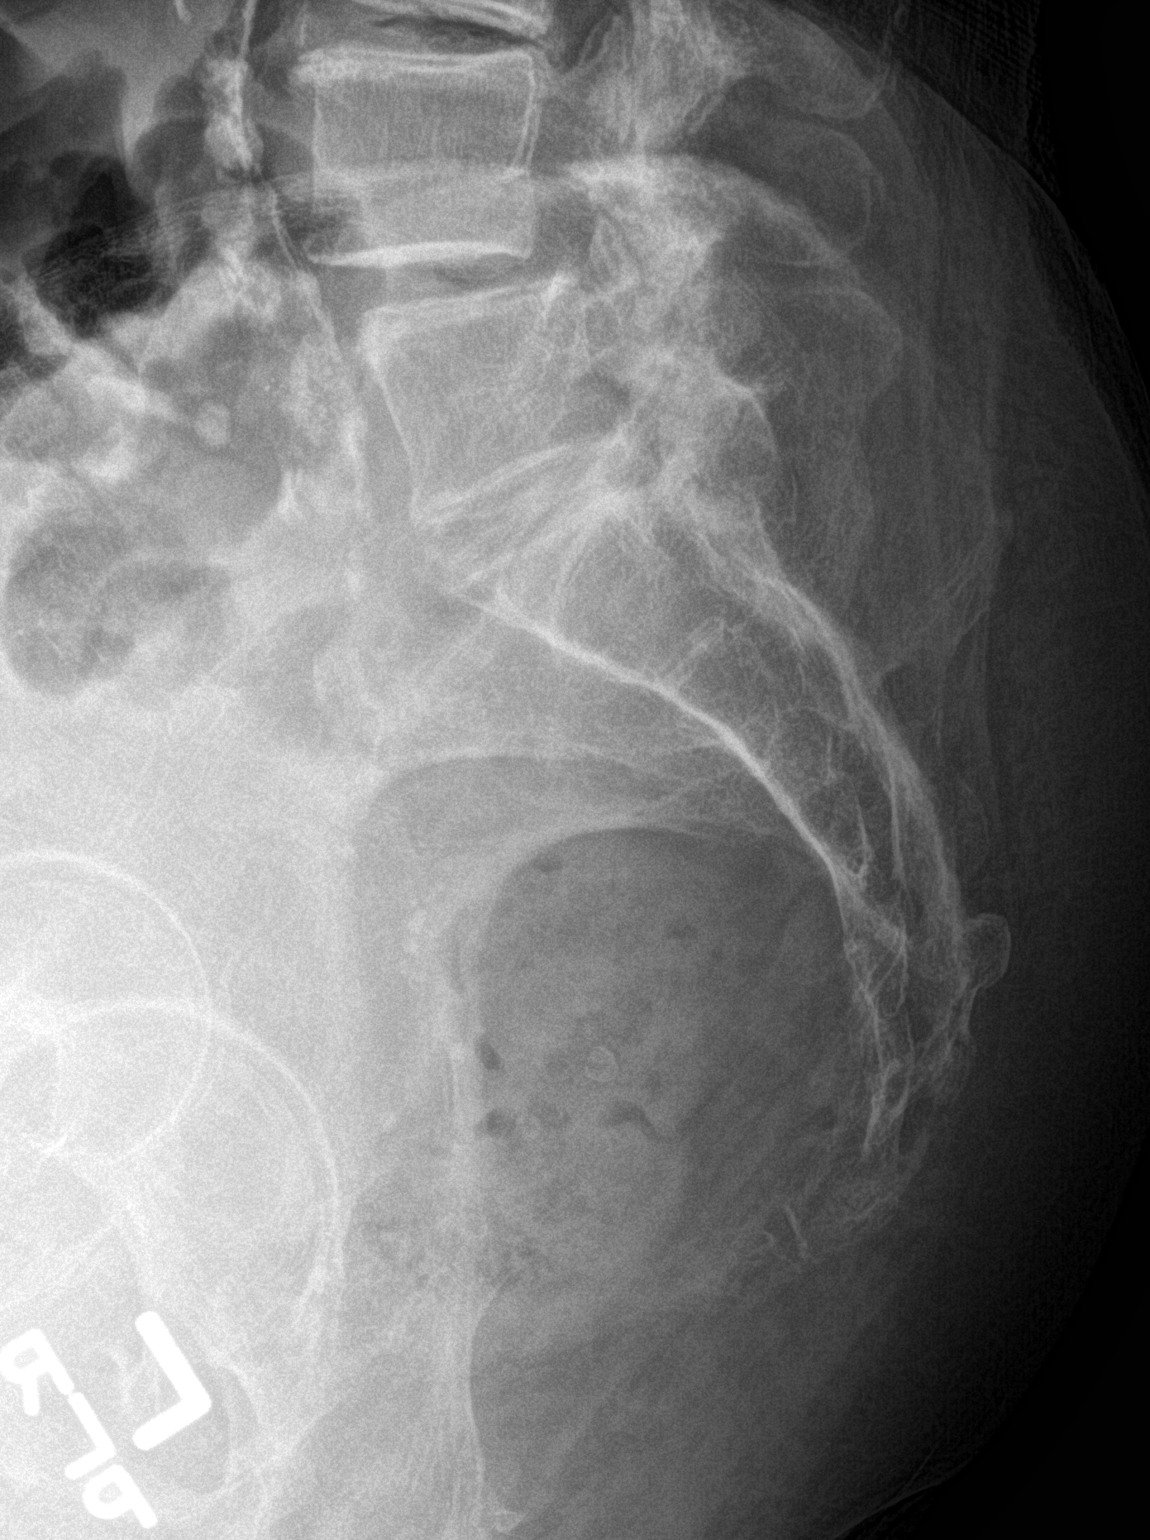

[3 of 3 positions shown; findings below may reference images not displayed]

FINDINGS: No acute bony or joint abnormality is identified. Facet degenerative
disease causes 0.5 cm anterolisthesis L4 on L5. Atherosclerosis is
noted.
IMPRESSION: No acute abnormality.

## 2016-01-07 ENCOUNTER — Encounter: Payer: Self-pay | Admitting: Internal Medicine

## 2016-01-07 ENCOUNTER — Ambulatory Visit (INDEPENDENT_AMBULATORY_CARE_PROVIDER_SITE_OTHER): Payer: Medicare Other | Admitting: Internal Medicine

## 2016-01-07 VITALS — BP 132/82 | HR 90 | Temp 97.9°F | Wt 115.5 lb

## 2016-01-07 DIAGNOSIS — K5000 Crohn's disease of small intestine without complications: Secondary | ICD-10-CM | POA: Diagnosis not present

## 2016-01-07 DIAGNOSIS — I10 Essential (primary) hypertension: Secondary | ICD-10-CM

## 2016-01-07 NOTE — Progress Notes (Signed)
Subjective:    Patient ID: Sabrina Hall, female    DOB: [birth year], 82 y.o.   MRN: [id number]  HPI Here for follow up of HTN and other conditions  Recently widowed Was expected but "still a blow" Starting to get out again, see friends, etc  Is planning to move to [place] soon  Has felt okay Decreased the diazepam ---taking 1/2 but still taking every night  No chest pain No SOB Gets sensation of "closing in" at times No dizziness or syncope  Current Outpatient Prescriptions on File Prior to Visit  Medication Sig Dispense Refill  . amLODipine (NORVASC) 5 MG tablet TAKE ONE TABLET BY MOUTH TWICE DAILY 60 tablet 3  . CycloSPORINE (RESTASIS OP) Apply to eye 2 (two) times daily.    . diazepam (VALIUM) 5 MG tablet Takes 1/2 to 1 tablet as needed at bedtime    . fish oil-omega-3 fatty acids 1000 MG capsule Take 1 g by mouth 2 (two) times daily.     . hydrALAZINE (APRESOLINE) 25 MG tablet TAKE ONE TABLET BY MOUTH TWICE DAILY 60 tablet 3  . loperamide (IMODIUM) 2 MG capsule Take 2 mg by mouth 2 (two) times daily.     Marland Kitchen losartan (COZAAR) 100 MG tablet Take 100 mg by mouth daily.    . metoprolol (LOPRESSOR) 50 MG tablet Take 0.5 tablets (25 mg total) by mouth 2 (two) times daily. 1 tablet 0  . Multiple Vitamin (MULTIVITAMIN) tablet Take 1 tablet by mouth daily.      Vladimir Faster Glycol-Propyl Glycol (SYSTANE OP) Apply to eye 4 (four) times daily.     No current facility-administered medications on file prior to visit.    Allergies  Allergen Reactions  . Lactose   . Maxzide [Hydrochlorothiazide W-Triamterene]   . Nsaids   . Penicillins   . Sulfonamide Derivatives     Past Medical History  Diagnosis Date  . Hypertension   . Crohn's   . Arthritis   . History of shingles   . Eczema   . History of hemorrhoids   . Psoriasis   . Deviated septum   . H/O seasonal allergies   . Cataract     Past Surgical History  Procedure Laterality Date  . Ileocecostomy    . Neck surgery   2004    mass removed from cervical spine  . Cholecystectomy    . Hysterectomy    . Mohs surgery    . Basel cell carcinoma    . Nose surgery      basal cell carcinoma of nose  . Nasal septum surgery    . Cataract extraction      right eye    Family History  Problem Relation Age of Onset  . Stroke Father   . Cancer Neg Hx   . Diabetes Neg Hx     Social History   Social History  . Marital Status: Widowed    Spouse Name: N/A  . Number of Children: 2  . Years of Education: N/A   Occupational History  . Schoolteacher     retired   Social History Main Topics  . Smoking status: Former Smoker -- 0.50 packs/day    Types: Cigarettes    Quit date: 09/09/1981  . Smokeless tobacco: Not on file  . Alcohol Use: Yes     Comment: occassionally  . Drug Use: No  . Sexual Activity: Not on file   Other Topics Concern  . Not on file  Social History Narrative   Has living will    Daughter is health care POA   Has DNR order--this is confirmed at visit 12/07/15   No tube feeds if cognitively unaware   Review of Systems  No blood in stool Appetite is okay Weight down a pound Crohn's has been quiet     Objective:   Physical Exam  Constitutional: She appears well-developed and well-nourished. No distress.  Neck: Normal range of motion. Neck supple.  Cardiovascular: Normal rate, regular rhythm and normal heart sounds.  Exam reveals no gallop.   No murmur heard. Pulmonary/Chest: Effort normal and breath sounds normal. No respiratory distress. She has no wheezes. She has no rales.  Abdominal: Soft. There is no tenderness.  Musculoskeletal: She exhibits no edema.  Lymphadenopathy:    She has no cervical adenopathy.  Psychiatric: She has a normal mood and affect. Her behavior is normal.          Assessment & Plan:

## 2016-01-07 NOTE — Progress Notes (Signed)
Pre visit review using our clinic review tool, if applicable. No additional management support is needed unless otherwise documented below in the visit note. 

## 2016-01-07 NOTE — Assessment & Plan Note (Signed)
This continues to be quiet

## 2016-01-07 NOTE — Assessment & Plan Note (Signed)
BP Readings from Last 3 Encounters:  01/07/16 132/82  12/07/15 128/76  08/30/14 130/70   Good control Pulse up so will continue the metoprolol at the lower dose

## 2016-01-07 NOTE — Patient Instructions (Signed)
I will plan to see you in your apartment at [place]

## 2016-01-29 ENCOUNTER — Telehealth: Payer: Self-pay | Admitting: Cardiovascular Disease

## 2016-01-29 ENCOUNTER — Ambulatory Visit (INDEPENDENT_AMBULATORY_CARE_PROVIDER_SITE_OTHER): Payer: Medicare Other | Admitting: Cardiovascular Disease

## 2016-01-29 ENCOUNTER — Encounter: Payer: Self-pay | Admitting: Cardiovascular Disease

## 2016-01-29 VITALS — BP 130/60 | HR 50 | Ht 61.0 in | Wt 114.3 lb

## 2016-01-29 DIAGNOSIS — R001 Bradycardia, unspecified: Secondary | ICD-10-CM

## 2016-01-29 DIAGNOSIS — F4321 Adjustment disorder with depressed mood: Secondary | ICD-10-CM | POA: Diagnosis not present

## 2016-01-29 DIAGNOSIS — G479 Sleep disorder, unspecified: Secondary | ICD-10-CM

## 2016-01-29 DIAGNOSIS — I10 Essential (primary) hypertension: Secondary | ICD-10-CM | POA: Diagnosis not present

## 2016-01-29 DIAGNOSIS — F4320 Adjustment disorder, unspecified: Secondary | ICD-10-CM | POA: Insufficient documentation

## 2016-01-29 NOTE — Telephone Encounter (Signed)
Nurse with [facility] called, states pt is not "acting right" today. States pt is confused, complaining of dizziness and shaky,a little pale and clammy. States vital are normal. Denies chest pain and SOB. Pt is coming in today to see Dr. Rockey Situ at 3:30 and wanted [place] to be aware.

## 2016-01-29 NOTE — Assessment & Plan Note (Signed)
Recommended that she stopped the metoprolol  We will monitor her blood pressure  Perhaps this will help with her energy

## 2016-01-29 NOTE — Progress Notes (Signed)
Patient ID: Sabrina Hall, female    DOB: July 18, 1923, 82 y.o.   MRN: [id number]  HPI Comments: Sabrina Hall is a pleasant 82 yo woman, patient of Dr. Kary Kos who currently lives at [facility] with a long history of hypertension, Crohn's disease, history of dermatologic issues, smoking history for 30 years, quit in 1989, who presents for routine follow-up of her blood pressure  On arrival today, she reports that her husband passed away approximately 2 months ago He had a fall, hit the back of his head, debilitated for the next month before passing.  She has fatigue, not sleeping well. Has not been exerting herself much, no regular exercise program. Uses a cane as she was having falls, no falls recently. Medications provided to her by [facility] nursing  EKG on today's visit shows sinus bradycardia with ventricular rate 51 bpm, right bundle branch block, no significant ST or T-wave changes  Other past medical history  previous  lab work shows total cholesterol 167, LDL 72, HDL 55 and 2012 , Normal renal function with creatinine 0.7  Family history; father died of stroke and atherosclerosis age 80, other died 24 from cancer  Allergies  Allergen Reactions  . Lactose   . Maxzide [Hydrochlorothiazide W-Triamterene]   . Nsaids   . Penicillins   . Sulfonamide Derivatives     Current Outpatient Prescriptions on File Prior to Visit  Medication Sig Dispense Refill  . amLODipine (NORVASC) 5 MG tablet TAKE ONE TABLET BY MOUTH TWICE DAILY 60 tablet 3  . CycloSPORINE (RESTASIS OP) Apply to eye 2 (two) times daily.    . diazepam (VALIUM) 5 MG tablet Takes 1/2 to 1 tablet as needed at bedtime    . fish oil-omega-3 fatty acids 1000 MG capsule Take 1 g by mouth 2 (two) times daily.     . hydrALAZINE (APRESOLINE) 25 MG tablet TAKE ONE TABLET BY MOUTH TWICE DAILY 60 tablet 3  . loperamide (IMODIUM) 2 MG capsule Take 2 mg by mouth 2 (two) times daily.     Marland Kitchen losartan (COZAAR) 100 MG tablet Take  100 mg by mouth daily.    . Multiple Vitamin (MULTIVITAMIN) tablet Take 1 tablet by mouth daily.      Vladimir Faster Glycol-Propyl Glycol (SYSTANE OP) Apply to eye 4 (four) times daily.     No current facility-administered medications on file prior to visit.    Past Medical History  Diagnosis Date  . Hypertension   . Crohn's   . Arthritis   . History of shingles   . Eczema   . History of hemorrhoids   . Psoriasis   . Deviated septum   . H/O seasonal allergies   . Cataract     Past Surgical History  Procedure Laterality Date  . Ileocecostomy    . Neck surgery  2004    mass removed from cervical spine  . Cholecystectomy    . Hysterectomy    . Mohs surgery    . Basel cell carcinoma    . Nose surgery      basal cell carcinoma of nose  . Nasal septum surgery    . Cataract extraction      right eye    Social History  reports that she quit smoking about 34 years ago. Her smoking use included Cigarettes. She smoked 0.50 packs per day. She does not have any smokeless tobacco history on file. She reports that she drinks alcohol. She reports that she does not use  illicit drugs.  Family History family history includes Stroke in her father. There is no history of Cancer or Diabetes.  Review of Systems  Constitutional: Positive for fatigue.  Respiratory: Negative.   Cardiovascular: Negative.   Gastrointestinal: Negative.   Musculoskeletal: Negative.   Skin: Negative.   Neurological: Negative.   Psychiatric/Behavioral: Negative.   All other systems reviewed and are negative.   BP 130/60 mmHg  Pulse 50  Ht 5' 1"  (1.549 m)  Wt 114 lb 5 oz (51.852 kg)  BMI 21.61 kg/m2  Physical Exam  Constitutional: She is oriented to person, place, and time. She appears well-developed and well-nourished.  Presenting in a wheelchair  HENT:  Head: Normocephalic.  Nose: Nose normal.  Mouth/Throat: Oropharynx is clear and moist.  Eyes: Conjunctivae are normal. Pupils are equal, round, and  reactive to light.  Neck: Normal range of motion. Neck supple. No JVD present.  Cardiovascular: Normal rate, regular rhythm, S1 normal, S2 normal and intact distal pulses.  Exam reveals no gallop and no friction rub.   Murmur heard.  Systolic murmur is present with a grade of 2/6  Pulmonary/Chest: Effort normal and breath sounds normal. No respiratory distress. She has no wheezes. She has no rales. She exhibits no tenderness.  Abdominal: Soft. Bowel sounds are normal. She exhibits no distension. There is no tenderness.  Musculoskeletal: Normal range of motion. She exhibits no edema or tenderness.  Lymphadenopathy:    She has no cervical adenopathy.  Neurological: She is alert and oriented to person, place, and time. Coordination normal.  Skin: Skin is warm and dry. No rash noted. No erythema.  Psychiatric: She has a normal mood and affect. Her behavior is normal. Judgment and thought content normal.    Assessment and Plan  Nursing note and vitals reviewed.

## 2016-01-29 NOTE — Assessment & Plan Note (Signed)
Long discussion concerning loss of her husband who is a patient in our clinic.   She was sad to see him suffer after a fall with trauma to the back of his head  She has had a difficult time adjusting to him being gone,  Lifetime partner.  Less motivated to do anything, no desire to walk or exercise.  Previously was very social, less desire to do this kind of things but she knows that she needs to   Total encounter time more than 25 minutes  Greater than 50% was spent in counseling and coordination of care with the patient

## 2016-01-29 NOTE — Assessment & Plan Note (Signed)
Recent sleep disorder secondary to loss of her husband  Recommended she start a walking program

## 2016-01-29 NOTE — Assessment & Plan Note (Signed)
Blood pressure is well controlled on today's visit.  We will hold the beta blocker secondary to bradycardia

## 2016-01-29 NOTE — Patient Instructions (Signed)
You are doing well.  Please stop the metoprolol  Please start your exercise program, daily  Please call us if you have new issues that need to be addressed before your next appt.  Your physician wants you to follow-up in: 6 months.  You will receive a reminder letter in the mail two months in advance. If you don't receive a letter, please call our office to schedule the follow-up appointment.

## 2016-01-31 ENCOUNTER — Non-Acute Institutional Stay: Payer: Medicare Other | Admitting: Internal Medicine

## 2016-01-31 ENCOUNTER — Encounter: Payer: Self-pay | Admitting: Internal Medicine

## 2016-01-31 VITALS — BP 124/74 | HR 76 | Temp 97.5°F | Resp 20 | Wt 115.0 lb

## 2016-01-31 DIAGNOSIS — F39 Unspecified mood [affective] disorder: Secondary | ICD-10-CM | POA: Diagnosis not present

## 2016-01-31 DIAGNOSIS — I700 Atherosclerosis of aorta: Secondary | ICD-10-CM

## 2016-01-31 DIAGNOSIS — I10 Essential (primary) hypertension: Secondary | ICD-10-CM

## 2016-01-31 DIAGNOSIS — IMO0001 Reserved for inherently not codable concepts without codable children: Secondary | ICD-10-CM | POA: Insufficient documentation

## 2016-01-31 DIAGNOSIS — K5000 Crohn's disease of small intestine without complications: Secondary | ICD-10-CM

## 2016-01-31 NOTE — Assessment & Plan Note (Signed)
Mild anxiety and sleep disturbance in past---worse now with husband's death Will increase diazepam to bid prn  She knows to limit use as much as possible

## 2016-01-31 NOTE — Assessment & Plan Note (Signed)
BP Readings from Last 3 Encounters:  01/31/16 124/74  01/29/16 130/60  01/07/16 132/82   Metoprolol stopped but still low May be able to wean other meds as well

## 2016-01-31 NOTE — Assessment & Plan Note (Signed)
Acts up a bit with her stress but no worrisome symptoms

## 2016-01-31 NOTE — Assessment & Plan Note (Signed)
Soft murmur but no signs of significant stenosis No testing indicated

## 2016-01-31 NOTE — Progress Notes (Signed)
Subjective:    Patient ID: Sabrina Hall, female    DOB: 07/24/1923, 82 y.o.   MRN: [id number]  HPI Initial visit here in assisted living Reviewed status with [facility] RN Reviewed Dr Donivan Scull recent visit note  Moved here about 3 weeks ago She admits that the move "have some merit" Having a bad time mostly grieving her husband--has been doing okay in general, but yesterday "I fell apart" Had a bad night then--not sure about what though "everything went stiff on me" Still feels tense and strung out Had breakfast sent up  Overall feels she has adjusted here okay Enjoys the meals [place] to get [place]  Stress does affect her Crohn's Several BMs today --without warning No pain or blood  No chest pain--but feels "a tightness over my whole body" Tried relaxation technique and it resolved the symptoms Breathing is fine Some dizziness --even in past day. Not prolonged  Current Outpatient Prescriptions on File Prior to Visit  Medication Sig Dispense Refill  . amLODipine (NORVASC) 5 MG tablet TAKE ONE TABLET BY MOUTH TWICE DAILY 60 tablet 3  . CycloSPORINE (RESTASIS OP) Apply to eye 2 (two) times daily.    . diazepam (VALIUM) 5 MG tablet Takes 1/2 to 1 tablet as needed at bedtime    . fish oil-omega-3 fatty acids 1000 MG capsule Take 1 g by mouth 2 (two) times daily.     . hydrALAZINE (APRESOLINE) 25 MG tablet TAKE ONE TABLET BY MOUTH TWICE DAILY 60 tablet 3  . loperamide (IMODIUM) 2 MG capsule Take 2 mg by mouth 2 (two) times daily as needed.     Marland Kitchen losartan (COZAAR) 100 MG tablet Take 100 mg by mouth daily.    . Multiple Vitamin (MULTIVITAMIN) tablet Take 1 tablet by mouth daily.      Vladimir Faster Glycol-Propyl Glycol (SYSTANE OP) Apply to eye 4 (four) times daily.     No current facility-administered medications on file prior to visit.    Allergies  Allergen Reactions  . Lactose   . Maxzide [Hydrochlorothiazide W-Triamterene]   .  Nsaids   . Penicillins   . Sulfonamide Derivatives     Past Medical History  Diagnosis Date  . Hypertension   . Crohn's   . Arthritis   . History of shingles   . Eczema   . History of hemorrhoids   . Psoriasis   . Deviated septum   . H/O seasonal allergies   . Cataract     Past Surgical History  Procedure Laterality Date  . Ileocecostomy    . Neck surgery  2004    mass removed from cervical spine  . Cholecystectomy    . Hysterectomy    . Mohs surgery    . Basel cell carcinoma    . Nose surgery      basal cell carcinoma of nose  . Nasal septum surgery    . Cataract extraction      right eye    Family History  Problem Relation Age of Onset  . Stroke Father   . Cancer Neg Hx   . Diabetes Neg Hx     Social History   Social History  . Marital Status: Widowed    Spouse Name: N/A  . Number of Children: 2  . Years of Education: N/A   Occupational History  . Schoolteacher     retired   Social History Main Topics  . Smoking status: Former Smoker -- 0.50  packs/day    Types: Cigarettes    Quit date: 09/09/1981  . Smokeless tobacco: Not on file  . Alcohol Use: Yes     Comment: occassionally  . Drug Use: No  . Sexual Activity: Not on file   Other Topics Concern  . Not on file   Social History Narrative   Has living will    Daughter is health care POA   Has DNR order--this is confirmed at visit 12/07/15   No tube feeds if cognitively unaware   Review of Systems Appetite is okay Weight has been going down in past year--but stabilizing Sleeping okay in general--- other than last night and other "patches"    Objective:   Physical Exam  Constitutional: She appears well-developed and well-nourished. No distress.  Neck: Normal range of motion. Neck supple. No thyromegaly present.  Cardiovascular: Normal rate and regular rhythm.  Exam reveals no gallop.   Gr 2/6 systolic murmur at base  Pulmonary/Chest: Effort normal and breath sounds normal. No  respiratory distress. She has no wheezes. She has no rales.  Abdominal: Soft. There is no tenderness.  Musculoskeletal: She exhibits no edema.  Lymphadenopathy:    She has no cervical adenopathy.  Psychiatric: She has a normal mood and affect. Her behavior is normal.          Assessment & Plan:

## 2016-03-11 ENCOUNTER — Encounter: Payer: Self-pay | Admitting: Internal Medicine

## 2016-03-11 ENCOUNTER — Ambulatory Visit (INDEPENDENT_AMBULATORY_CARE_PROVIDER_SITE_OTHER): Payer: Medicare Other | Admitting: Internal Medicine

## 2016-03-11 VITALS — BP 118/60 | HR 75 | Temp 97.6°F | Wt 117.0 lb

## 2016-03-11 DIAGNOSIS — R269 Unspecified abnormalities of gait and mobility: Secondary | ICD-10-CM | POA: Diagnosis not present

## 2016-03-11 LAB — VITAMIN B12: Vitamin B-12: 121 pg/mL — ABNORMAL LOW (ref 211–911)

## 2016-03-11 NOTE — Progress Notes (Signed)
Pre visit review using our clinic review tool, if applicable. No additional management support is needed unless otherwise documented below in the visit note. 

## 2016-03-11 NOTE — Assessment & Plan Note (Signed)
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

## 2016-03-11 NOTE — Progress Notes (Signed)
Subjective:    Patient ID: Sabrina Hall, female    DOB: 19-Dec-1922, 82 y.o.   MRN: [id number]  HPI Here with daughter Concerned about her writing--- just not legible Thinks it started after husband's illness and death  Writes uphill and to the right Starts okay--but then deteriorates No tremor No stiffness  Compiled list yesterday Trouble walking due to balance and dizziness--cane helps Tires easily--unable to complete tasks Trouble with hand-eye coordination at times Uses cane even in her room Some memory loss--daughter notes some confusion if tired or pressured No trouble following instructions  Steps are smaller-- helps her keep balance Doesn't tend to walk on heels Does try to walk regularly  Denied by long term care insurance Occasionally needs help with clean up after bowel incontinence Does other ADLs though Now with increasing trouble walking-- no longer can drive  Current Outpatient Prescriptions on File Prior to Visit  Medication Sig Dispense Refill  . amLODipine (NORVASC) 5 MG tablet TAKE ONE TABLET BY MOUTH TWICE DAILY 60 tablet 3  . CycloSPORINE (RESTASIS OP) Apply to eye 2 (two) times daily.    . diazepam (VALIUM) 5 MG tablet Takes 1/2 to 1 tablet as needed at bedtime    . fish oil-omega-3 fatty acids 1000 MG capsule Take 1 g by mouth 2 (two) times daily.     . hydrALAZINE (APRESOLINE) 25 MG tablet TAKE ONE TABLET BY MOUTH TWICE DAILY 60 tablet 3  . loperamide (IMODIUM) 2 MG capsule Take 2 mg by mouth 2 (two) times daily as needed.     Marland Kitchen losartan (COZAAR) 100 MG tablet Take 100 mg by mouth daily.    . Multiple Vitamin (MULTIVITAMIN) tablet Take 1 tablet by mouth daily.      Vladimir Faster Glycol-Propyl Glycol (SYSTANE OP) Apply to eye 4 (four) times daily.     No current facility-administered medications on file prior to visit.    Allergies  Allergen Reactions  . Lactose   . Maxzide [Hydrochlorothiazide W-Triamterene]   . Nsaids   . Penicillins     . Sulfonamide Derivatives     Past Medical History  Diagnosis Date  . Hypertension   . Crohn's   . Arthritis   . History of shingles   . Eczema   . History of hemorrhoids   . Psoriasis   . Deviated septum   . H/O seasonal allergies   . Cataract     Past Surgical History  Procedure Laterality Date  . Ileocecostomy    . Neck surgery  2004    mass removed from cervical spine  . Cholecystectomy    . Hysterectomy    . Mohs surgery    . Basel cell carcinoma    . Nose surgery      basal cell carcinoma of nose  . Nasal septum surgery    . Cataract extraction      right eye    Family History  Problem Relation Age of Onset  . Stroke Father   . Cancer Neg Hx   . Diabetes Neg Hx     Social History   Social History  . Marital Status: Widowed    Spouse Name: N/A  . Number of Children: 2  . Years of Education: N/A   Occupational History  . Schoolteacher     retired   Social History Main Topics  . Smoking status: Former Smoker -- 0.50 packs/day    Types: Cigarettes    Quit date: 09/09/1981  .  Smokeless tobacco: Not on file  . Alcohol Use: Yes     Comment: occassionally  . Drug Use: No  . Sexual Activity: Not on file   Other Topics Concern  . Not on file   Social History Narrative   Has living will    Daughter is health care POA   Has DNR order--this is confirmed at visit 12/07/15   No tube feeds if cognitively unaware   Review of Systems Same problems with overactive bowels Daughter concerned about "mild mania--gets going and can't stop, till her body crashes" Sleep is erratic--well some nights, then have a bad night    Objective:   Physical Exam  Constitutional: She is oriented to person, place, and time. She appears well-developed. No distress.  Eyes:  No nystagmus  Neck: Normal range of motion. No thyromegaly present.  Lymphadenopathy:    She has no cervical adenopathy.  Neurological: She is alert and oriented to person, place, and time. No  cranial nerve deficit. She displays a negative Romberg sign. Coordination normal.  Slightly unstable but not ataxic Not shuffling No tremor Finger to nose fairly normal Symmetric 4+/5 strength throughout Handwriting not shaky, slightly smaller, moves up to the right at the end          Assessment & Plan:

## 2016-03-26 ENCOUNTER — Encounter: Payer: Self-pay | Admitting: Internal Medicine

## 2016-03-26 ENCOUNTER — Non-Acute Institutional Stay: Payer: Medicare Other | Admitting: Internal Medicine

## 2016-03-26 VITALS — BP 142/68 | HR 72 | Temp 98.6°F | Resp 16

## 2016-03-26 DIAGNOSIS — W19XXXA Unspecified fall, initial encounter: Secondary | ICD-10-CM | POA: Diagnosis not present

## 2016-03-26 DIAGNOSIS — M546 Pain in thoracic spine: Secondary | ICD-10-CM | POA: Diagnosis not present

## 2016-03-26 DIAGNOSIS — Y92129 Unspecified place in nursing home as the place of occurrence of the external cause: Principal | ICD-10-CM

## 2016-03-26 NOTE — Progress Notes (Signed)
Subjective:    Patient ID: Sabrina Hall, female    DOB: [birth year], 82 y.o.   MRN: [id number]  HPI  Asked to evaluate resident in [facility] [place] this morning, while standing at [employer] on right side, his back on rocking chair Did not hit head, no LOC No difficulty breathing, no abdominal pain Has not taken anything for this  Review of Systems      Past Medical History  Diagnosis Date  . Hypertension   . Crohn's   . Arthritis   . History of shingles   . Eczema   . History of hemorrhoids   . Psoriasis   . Deviated septum   . H/O seasonal allergies   . Cataract     Current Outpatient Prescriptions  Medication Sig Dispense Refill  . amLODipine (NORVASC) 5 MG tablet TAKE ONE TABLET BY MOUTH TWICE DAILY 60 tablet 3  . aspirin 81 MG tablet Take 81 mg by mouth daily.    . CycloSPORINE (RESTASIS OP) Apply to eye 2 (two) times daily.    . diazepam (VALIUM) 5 MG tablet Takes 1/2 to 1 tablet as needed at bedtime    . fish oil-omega-3 fatty acids 1000 MG capsule Take 1 g by mouth 2 (two) times daily.     . hydrALAZINE (APRESOLINE) 25 MG tablet TAKE ONE TABLET BY MOUTH TWICE DAILY 60 tablet 3  . loperamide (IMODIUM) 2 MG capsule Take 2 mg by mouth 2 (two) times daily as needed.     Marland Kitchen losartan (COZAAR) 100 MG tablet Take 100 mg by mouth daily.    . metoprolol (LOPRESSOR) 50 MG tablet Take 0.5 tablets by mouth daily.    . Multiple Vitamin (MULTIVITAMIN) tablet Take 1 tablet by mouth daily.      Vladimir Faster Glycol-Propyl Glycol (SYSTANE OP) Apply to eye 4 (four) times daily.     No current facility-administered medications for this visit.    Allergies  Allergen Reactions  . Lactose   . Maxzide [Hydrochlorothiazide W-Triamterene]   . Nsaids   . Penicillins   . Sulfonamide Derivatives     Family History  Problem Relation Age of Onset  . Stroke Father   . Cancer Neg Hx   . Diabetes Neg Hx     Social History   Social History  . Marital Status: Widowed   Spouse Name: N/A  . Number of Children: 2  . Years of Education: N/A   Occupational History  . Schoolteacher     retired   Social History Main Topics  . Smoking status: Former Smoker -- 0.50 packs/day    Types: Cigarettes    Quit date: 09/09/1981  . Smokeless tobacco: Not on file  . Alcohol Use: Yes     Comment: occassionally  . Drug Use: No  . Sexual Activity: Not on file   Other Topics Concern  . Not on file   Social History Narrative   Has living will    Daughter is health care POA   Has DNR order--this is confirmed at visit 12/07/15   No tube feeds if cognitively unaware     Constitutional: Denies fever, malaise, fatigue, headache or abrupt weight changes.  Respiratory: Denies difficulty breathing, shortness of breath, cough or sputum production.   Cardiovascular: Denies chest pain, chest tightness, palpitations or swelling in the hands or feet.  Gastrointestinal: Denies abdominal pain, bloating, constipation, diarrhea or blood in the stool.  Musculoskeletal: Pt reports right side pain.   Skin:  Denies redness, rashes, lesions or ulcercations.  Neurological: Pt has difficulty with balance and coordination. Denies dizziness, difficulty with memory, difficulty with speech.   No other specific complaints in a complete review of systems (except as listed in HPI above).  Objective:   Physical Exam   BP 142/68 mmHg  Pulse 72  Temp(Src) 98.6 F (37 C)  Resp 16 Wt Readings from Last 3 Encounters:  03/11/16 117 lb (53.071 kg)  01/31/16 115 lb (52.164 kg)  01/29/16 114 lb 5 oz (51.852 kg)    General: Appears her stated age, in NAD. Skin: Warm, dry and intact. Horizontal bruise noted over right posterior ribs. Pulmonary/Chest: Normal effort and positive vesicular breath sounds. No respiratory distress. No wheezes, rales or ronchi noted.  Abdomen: Soft and nontender. Musculoskeletal: Pain with palpation over right lateral ribs.   BMET    Component Value Date/Time     NA 138 12/07/2015 1227   NA 135* 10/07/2014 1142   K 4.1 12/07/2015 1227   K 3.5 10/07/2014 1142   CL 99 12/07/2015 1227   CL 96* 10/07/2014 1142   CO2 31 12/07/2015 1227   CO2 31 10/07/2014 1142   GLUCOSE 85 12/07/2015 1227   GLUCOSE 252* 10/07/2014 1142   BUN 16 12/07/2015 1227   BUN 16 10/07/2014 1142   CREATININE 0.73 12/07/2015 1227   CREATININE 0.96 10/07/2014 1142   CALCIUM 9.5 12/07/2015 1227   CALCIUM 8.8 10/07/2014 1142   GFRNONAA 58* 10/07/2014 1142   GFRNONAA 59* 05/25/2014 1136   GFRNONAA 105.05 06/12/2010 1217   GFRAA >60 10/07/2014 1142   GFRAA >60 05/25/2014 1136   GFRAA 88 10/25/2008 1444    Lipid Panel  No results found for: CHOL, TRIG, HDL, CHOLHDL, VLDL, LDLCALC  CBC    Component Value Date/Time   WBC 6.6 12/07/2015 1712   WBC 4.4 10/07/2014 1142   RBC 4.71 12/07/2015 1712   RBC 4.80 10/07/2014 1142   HGB 14.3 12/07/2015 1712   HGB 14.8 10/07/2014 1142   HCT 42.6 12/07/2015 1712   HCT 45.0 10/07/2014 1142   PLT SEE NOTE 12/07/2015 1712   PLT 169 10/07/2014 1142   MCV 90.4 12/07/2015 1712   MCV 94 10/07/2014 1142   MCH 30.4 12/07/2015 1712   MCH 30.7 10/07/2014 1142   MCHC 33.6 12/07/2015 1712   MCHC 32.8 10/07/2014 1142   RDW 13.3 12/07/2015 1712   RDW 13.1 10/07/2014 1142   LYMPHSABS 1.4 12/07/2015 1712   LYMPHSABS 0.8* 10/07/2014 1142   MONOABS 0.7 12/07/2015 1712   MONOABS 0.5 10/07/2014 1142   EOSABS 0.3 12/07/2015 1712   EOSABS 0.1 10/07/2014 1142   BASOSABS 0.1 12/07/2015 1712   BASOSABS 0.0 10/07/2014 1142    Hgb A1C No results found for: HGBA1C      Assessment & Plan:   Fall at ALF, with right lateral side pain:  There is a possibility of rib fracture, but xray not indicated (lungs clear, doubt pneumothorax or hemothorax) Encouraged deep breathing Can use a heating pad over the affected area Tylenol per standing order Let RN if pain not controled with Tylenol RN to watch for hematuria  Will reassess as  needed Webb Silversmith, NP

## 2016-03-30 DIAGNOSIS — K5090 Crohn's disease, unspecified, without complications: Secondary | ICD-10-CM | POA: Diagnosis not present

## 2016-03-30 DIAGNOSIS — I10 Essential (primary) hypertension: Secondary | ICD-10-CM | POA: Diagnosis not present

## 2016-03-30 DIAGNOSIS — H3530 Unspecified macular degeneration: Secondary | ICD-10-CM | POA: Diagnosis not present

## 2016-03-30 DIAGNOSIS — M150 Primary generalized (osteo)arthritis: Secondary | ICD-10-CM | POA: Diagnosis not present

## 2016-03-31 ENCOUNTER — Telehealth: Payer: Self-pay

## 2016-03-31 NOTE — Telephone Encounter (Signed)
Esther OT with [facility] left v/m requesting verbal order for home health OT one more visit to complete plan of care.

## 2016-03-31 NOTE — Telephone Encounter (Signed)
Left detailed message on VM for [facility]

## 2016-03-31 NOTE — Telephone Encounter (Signed)
That is okay.

## 2016-04-03 ENCOUNTER — Encounter: Payer: Self-pay | Admitting: Internal Medicine

## 2016-05-22 ENCOUNTER — Encounter: Payer: Self-pay | Admitting: Internal Medicine

## 2016-05-22 ENCOUNTER — Non-Acute Institutional Stay: Payer: Medicare Other | Admitting: Internal Medicine

## 2016-05-22 VITALS — BP 122/64 | HR 70 | Temp 97.4°F | Resp 16 | Wt 121.0 lb

## 2016-05-22 DIAGNOSIS — K5080 Crohn's disease of both small and large intestine without complications: Secondary | ICD-10-CM | POA: Diagnosis not present

## 2016-05-22 DIAGNOSIS — E538 Deficiency of other specified B group vitamins: Secondary | ICD-10-CM | POA: Insufficient documentation

## 2016-05-22 DIAGNOSIS — I10 Essential (primary) hypertension: Secondary | ICD-10-CM | POA: Diagnosis not present

## 2016-05-22 DIAGNOSIS — F39 Unspecified mood [affective] disorder: Secondary | ICD-10-CM

## 2016-05-22 DIAGNOSIS — M159 Polyosteoarthritis, unspecified: Secondary | ICD-10-CM

## 2016-05-22 NOTE — Assessment & Plan Note (Signed)
Has done better recently Still uses the valium to initiate sleep but not needing in day

## 2016-05-22 NOTE — Assessment & Plan Note (Signed)
Symptoms seem better Will recheck level

## 2016-05-22 NOTE — Progress Notes (Signed)
Subjective:    Patient ID: Sabrina Hall, female    DOB: September 27, 1923, 82 y.o.   MRN: [id number]  HPI Visit for review of chronic health conditions Reviewed status with [name] RN here  Now on B12 since last visit Has improved with balance Does use walker out of room--after a recent fall  Crohn's does act up some No blood in stool Just gets bowel changes--no pain Appetite is okay Weight is up a few pounds!  Mood has been okay More comfortable here Uses the valium nightly to help her sleep---hasn't needed during the day  No chest pain No palpitations--but notices it faster when she is moving around No dizziness No edema---or just slight at the end of the day  Current Outpatient Prescriptions on File Prior to Visit  Medication Sig Dispense Refill  . amLODipine (NORVASC) 5 MG tablet TAKE ONE TABLET BY MOUTH TWICE DAILY 60 tablet 3  . aspirin 81 MG tablet Take 81 mg by mouth daily.    . CycloSPORINE (RESTASIS OP) Apply to eye 2 (two) times daily as needed.     . diazepam (VALIUM) 5 MG tablet Takes 1/2  tablet bid prn    . fish oil-omega-3 fatty acids 1000 MG capsule Take 1 g by mouth 2 (two) times daily.     . hydrALAZINE (APRESOLINE) 25 MG tablet TAKE ONE TABLET BY MOUTH TWICE DAILY 60 tablet 3  . loperamide (IMODIUM) 2 MG capsule Take 2 mg by mouth 2 (two) times daily as needed.     Marland Kitchen losartan (COZAAR) 100 MG tablet Take 100 mg by mouth daily.    . Multiple Vitamin (MULTIVITAMIN) tablet Take 1 tablet by mouth daily.      Vladimir Faster Glycol-Propyl Glycol (SYSTANE OP) Apply to eye 4 (four) times daily.     No current facility-administered medications on file prior to visit.     Allergies  Allergen Reactions  . Lactose   . Maxzide [Hydrochlorothiazide W-Triamterene]   . Nsaids   . Penicillins   . Sulfonamide Derivatives     Past Medical History:  Diagnosis Date  . Arthritis   . Cataract   . Crohn's   . Deviated septum   . Eczema   . H/O seasonal allergies     . History of hemorrhoids   . History of shingles   . Hypertension   . Psoriasis   . Vitamin B12 deficiency     Past Surgical History:  Procedure Laterality Date  . basel cell carcinoma    . CATARACT EXTRACTION     right eye  . CHOLECYSTECTOMY    . HYSTERECTOMY    . ILEOCECOSTOMY    . MOHS SURGERY    . NASAL SEPTUM SURGERY    . NECK SURGERY  2004   mass removed from cervical spine  . NOSE SURGERY     basal cell carcinoma of nose    Family History  Problem Relation Age of Onset  . Stroke Father   . Cancer Neg Hx   . Diabetes Neg Hx     Social History   Social History  . Marital status: Widowed    Spouse name: N/A  . Number of children: 2  . Years of education: N/A   Occupational History  . Schoolteacher     retired   Social History Main Topics  . Smoking status: Former Smoker    Packs/day: 0.50    Types: Cigarettes    Quit date: 09/09/1981  . Smokeless  tobacco: Not on file  . Alcohol use Yes     Comment: occassionally  . Drug use: No  . Sexual activity: Not on file   Other Topics Concern  . Not on file   Social History Narrative   Has living will    Daughter is health care POA   Has DNR order--this is confirmed at visit 12/07/15   No tube feeds if cognitively unaware   Review of Systems  Has some early AM arthritic pain--mostly hips. Improves after she gets moving Voids okay Dry skin--feels it gets dry in her room.  Uses moisturizers No heartburn     Objective:   Physical Exam  Constitutional: She appears well-developed and well-nourished. No distress.  Neck: Normal range of motion. Neck supple. No thyromegaly present.  Cardiovascular: Normal rate and regular rhythm.  Exam reveals no gallop.   Soft aortic systolic murmur  Pulmonary/Chest: Effort normal and breath sounds normal. No respiratory distress. She has no wheezes. She has no rales.  Abdominal: Soft. There is no tenderness.  Musculoskeletal: She exhibits no edema or tenderness.   Lymphadenopathy:    She has no cervical adenopathy.  Skin: No rash noted. No erythema.  Psychiatric: She has a normal mood and affect. Her behavior is normal.          Assessment & Plan:

## 2016-05-22 NOTE — Assessment & Plan Note (Signed)
Episodic diarrhea without pain or blood Appetite and weight okay

## 2016-05-22 NOTE — Assessment & Plan Note (Signed)
Mild No Rx needed

## 2016-05-22 NOTE — Assessment & Plan Note (Signed)
BP Readings from Last 3 Encounters:  05/22/16 122/64  03/26/16 (!) 142/68  03/11/16 118/60   Good control No changes needed

## 2016-05-26 ENCOUNTER — Encounter: Payer: Self-pay | Admitting: Internal Medicine

## 2016-07-09 ENCOUNTER — Non-Acute Institutional Stay: Payer: Medicare Other | Admitting: Internal Medicine

## 2016-07-09 ENCOUNTER — Encounter: Payer: Self-pay | Admitting: Internal Medicine

## 2016-07-09 VITALS — BP 124/68 | HR 68

## 2016-07-09 DIAGNOSIS — R413 Other amnesia: Secondary | ICD-10-CM

## 2016-07-09 DIAGNOSIS — I10 Essential (primary) hypertension: Secondary | ICD-10-CM

## 2016-07-09 DIAGNOSIS — F411 Generalized anxiety disorder: Secondary | ICD-10-CM

## 2016-07-09 NOTE — Progress Notes (Signed)
Subjective:    Patient ID: Sabrina Hall, female    DOB: [birth year], 82 y.o.   MRN: [id number]  HPI  Asked to evaluate resident in [address] Daughter, visiting from [place], has some concerns about her mothers memory issues and increased anxiety. She reports she seems to have more short term memory loss. She can not remember things people just told her. She sits things down and can not find them later. She reports her mother seemed anxious at this visit and noticed that her BP was elevated. Mrs. Rohrbach has not noticed any changes in her memory, neither has RN No history of dementia. She reports she was anxious about her daughter coming, wanting to make sure her daughters visit was "perfect" but she denies ongoing anxiety or depression. She has HTN and is not surprised her BP was elevated when she was anxious, as that seems like a normal reaction to her. She is on Norvasc, Hydralazine, and Losartan.  Review of Systems      Past Medical History:  Diagnosis Date  . Arthritis   . Cataract   . Crohn's   . Deviated septum   . Eczema   . H/O seasonal allergies   . History of hemorrhoids   . History of shingles   . Hypertension   . Psoriasis   . Vitamin B12 deficiency     Current Outpatient Prescriptions  Medication Sig Dispense Refill  . amLODipine (NORVASC) 5 MG tablet TAKE ONE TABLET BY MOUTH TWICE DAILY 60 tablet 3  . aspirin 81 MG tablet Take 81 mg by mouth daily.    . Cyanocobalamin (VITAMIN B-12) 500 MCG SUBL Place 500 mcg under the tongue daily.    . CycloSPORINE (RESTASIS OP) Apply to eye 2 (two) times daily as needed.     . diazepam (VALIUM) 5 MG tablet Takes 1/2  tablet bid prn    . fish oil-omega-3 fatty acids 1000 MG capsule Take 1 g by mouth 2 (two) times daily.     . hydrALAZINE (APRESOLINE) 25 MG tablet TAKE ONE TABLET BY MOUTH TWICE DAILY 60 tablet 3  . lactase (LACTAID) 3000 units tablet Take 6,000 Units by mouth 3 (three) times daily with meals.    Marland Kitchen  loperamide (IMODIUM) 2 MG capsule Take 2 mg by mouth 2 (two) times daily as needed.     Marland Kitchen losartan (COZAAR) 100 MG tablet Take 100 mg by mouth daily.    . Multiple Vitamin (MULTIVITAMIN) tablet Take 1 tablet by mouth daily.      Vladimir Faster Glycol-Propyl Glycol (SYSTANE OP) Apply to eye 4 (four) times daily.     No current facility-administered medications for this visit.     Allergies  Allergen Reactions  . Lactose   . Maxzide [Hydrochlorothiazide W-Triamterene]   . Nsaids   . Penicillins   . Sulfonamide Derivatives     Family History  Problem Relation Age of Onset  . Stroke Father   . Cancer Neg Hx   . Diabetes Neg Hx     Social History   Social History  . Marital status: Widowed    Spouse name: N/A  . Number of children: 2  . Years of education: N/A   Occupational History  . Schoolteacher     retired   Social History Main Topics  . Smoking status: Former Smoker    Packs/day: 0.50    Types: Cigarettes    Quit date: 09/09/1981  . Smokeless tobacco: Not on file  .  Alcohol use Yes     Comment: occassionally  . Drug use: No  . Sexual activity: Not on file   Other Topics Concern  . Not on file   Social History Narrative   Has living will    Daughter is health care POA   Has DNR order--this is confirmed at visit 12/07/15   No tube feeds if cognitively unaware     Constitutional: Denies fever, malaise, fatigue, headache or abrupt weight changes.  Neurological: Denies dizziness, difficulty with memory, difficulty with speech or problems with balance and coordination.  Psych: Denies anxiety, depression, SI/HI.  No other specific complaints in a complete review of systems (except as listed in HPI above).  Objective:   Physical Exam  BP 124/68   Pulse 68  Wt Readings from Last 3 Encounters:  05/22/16 121 lb (54.9 kg)  03/11/16 117 lb (53.1 kg)  01/31/16 115 lb (52.2 kg)    General: Appears her stated age,  in NAD. Neurological: Alert and oriented.    Psychiatric: Mood and affect normal. Behavior is normal. Judgment and thought content normal.     BMET    Component Value Date/Time   NA 138 12/07/2015 1227   NA 135 (L) 10/07/2014 1142   K 4.1 12/07/2015 1227   K 3.5 10/07/2014 1142   CL 99 12/07/2015 1227   CL 96 (L) 10/07/2014 1142   CO2 31 12/07/2015 1227   CO2 31 10/07/2014 1142   GLUCOSE 85 12/07/2015 1227   GLUCOSE 252 (H) 10/07/2014 1142   BUN 16 12/07/2015 1227   BUN 16 10/07/2014 1142   CREATININE 0.73 12/07/2015 1227   CREATININE 0.96 10/07/2014 1142   CALCIUM 9.5 12/07/2015 1227   CALCIUM 8.8 10/07/2014 1142   GFRNONAA 58 (L) 10/07/2014 1142   GFRNONAA 59 (L) 05/25/2014 1136   GFRAA >60 10/07/2014 1142   GFRAA >60 05/25/2014 1136    Lipid Panel  No results found for: CHOL, TRIG, HDL, CHOLHDL, VLDL, LDLCALC  CBC    Component Value Date/Time   WBC 6.6 12/07/2015 1712   RBC 4.71 12/07/2015 1712   HGB 14.3 12/07/2015 1712   HGB 14.8 10/07/2014 1142   HCT 42.6 12/07/2015 1712   HCT 45.0 10/07/2014 1142   PLT SEE NOTE 12/07/2015 1712   PLT 169 10/07/2014 1142   MCV 90.4 12/07/2015 1712   MCV 94 10/07/2014 1142   MCH 30.4 12/07/2015 1712   MCHC 33.6 12/07/2015 1712   RDW 13.3 12/07/2015 1712   RDW 13.1 10/07/2014 1142   LYMPHSABS 1.4 12/07/2015 1712   LYMPHSABS 0.8 (L) 10/07/2014 1142   MONOABS 0.7 12/07/2015 1712   MONOABS 0.5 10/07/2014 1142   EOSABS 0.3 12/07/2015 1712   EOSABS 0.1 10/07/2014 1142   BASOSABS 0.1 12/07/2015 1712   BASOSABS 0.0 10/07/2014 1142    Hgb A1C No results found for: HGBA1C          Assessment & Plan:   Memory loss:  Likely age related I do not think she has any dementia at this time No further workup at this time  Anxiety:  Resolved per pt No medication changes at this time  HTN:  Controlled Continue current meds  Will follow up as needed Webb Silversmith, NP

## 2016-07-09 NOTE — Patient Instructions (Signed)
Hypertension Hypertension, commonly called high blood pressure, is when the force of blood pumping through your arteries is too strong. Your arteries are the blood vessels that carry blood from your heart throughout your body. A blood pressure reading consists of a higher number over a lower number, such as 110/72. The higher number (systolic) is the pressure inside your arteries when your heart pumps. The lower number (diastolic) is the pressure inside your arteries when your heart relaxes. Ideally you want your blood pressure below 120/80. Hypertension forces your heart to work harder to pump blood. Your arteries may become narrow or stiff. Having untreated or uncontrolled hypertension can cause heart attack, stroke, kidney disease, and other problems. RISK FACTORS Some risk factors for high blood pressure are controllable. Others are not.  Risk factors you cannot control include:   Race. You may be at higher risk if you are African American.  Age. Risk increases with age.  Gender. Men are at higher risk than women before age 45 years. After age 65, women are at higher risk than men. Risk factors you can control include:  Not getting enough exercise or physical activity.  Being overweight.  Getting too much fat, sugar, calories, or salt in your diet.  Drinking too much alcohol. SIGNS AND SYMPTOMS Hypertension does not usually cause signs or symptoms. Extremely high blood pressure (hypertensive crisis) may cause headache, anxiety, shortness of breath, and nosebleed. DIAGNOSIS To check if you have hypertension, your health care provider will measure your blood pressure while you are seated, with your arm held at the level of your heart. It should be measured at least twice using the same arm. Certain conditions can cause a difference in blood pressure between your right and left arms. A blood pressure reading that is higher than normal on one occasion does not mean that you need treatment. If  it is not clear whether you have high blood pressure, you may be asked to return on a different day to have your blood pressure checked again. Or, you may be asked to monitor your blood pressure at home for 1 or more weeks. TREATMENT Treating high blood pressure includes making lifestyle changes and possibly taking medicine. Living a healthy lifestyle can help lower high blood pressure. You may need to change some of your habits. Lifestyle changes may include:  Following the DASH diet. This diet is high in fruits, vegetables, and whole grains. It is low in salt, red meat, and added sugars.  Keep your sodium intake below 2,300 mg per day.  Getting at least 30-45 minutes of aerobic exercise at least 4 times per week.  Losing weight if necessary.  Not smoking.  Limiting alcoholic beverages.  Learning ways to reduce stress. Your health care provider may prescribe medicine if lifestyle changes are not enough to get your blood pressure under control, and if one of the following is true:  You are 18-59 years of age and your systolic blood pressure is above 140.  You are 60 years of age or older, and your systolic blood pressure is above 150.  Your diastolic blood pressure is above 90.  You have diabetes, and your systolic blood pressure is over 140 or your diastolic blood pressure is over 90.  You have kidney disease and your blood pressure is above 140/90.  You have heart disease and your blood pressure is above 140/90. Your personal target blood pressure may vary depending on your medical conditions, your age, and other factors. HOME CARE INSTRUCTIONS    Have your blood pressure rechecked as directed by your health care provider.   Take medicines only as directed by your health care provider. Follow the directions carefully. Blood pressure medicines must be taken as prescribed. The medicine does not work as well when you skip doses. Skipping doses also puts you at risk for  problems.  Do not smoke.   Monitor your blood pressure at home as directed by your health care provider. SEEK MEDICAL CARE IF:   You think you are having a reaction to medicines taken.  You have recurrent headaches or feel dizzy.  You have swelling in your ankles.  You have trouble with your vision. SEEK IMMEDIATE MEDICAL CARE IF:  You develop a severe headache or confusion.  You have unusual weakness, numbness, or feel faint.  You have severe chest or abdominal pain.  You vomit repeatedly.  You have trouble breathing. MAKE SURE YOU:   Understand these instructions.  Will watch your condition.  Will get help right away if you are not doing well or get worse.   This information is not intended to replace advice given to you by your health care provider. Make sure you discuss any questions you have with your health care provider.   Document Released: 09/15/2005 Document Revised: 01/30/2015 Document Reviewed: 07/08/2013 Elsevier Interactive Patient Education 2016 [employer] Inc[employer]  

## 2016-07-14 ENCOUNTER — Encounter: Payer: Self-pay | Admitting: Cardiovascular Disease

## 2016-07-14 ENCOUNTER — Ambulatory Visit (INDEPENDENT_AMBULATORY_CARE_PROVIDER_SITE_OTHER): Payer: Medicare Other | Admitting: Cardiovascular Disease

## 2016-07-14 VITALS — BP 148/72 | HR 75 | Ht 62.0 in | Wt 125.0 lb

## 2016-07-14 DIAGNOSIS — R001 Bradycardia, unspecified: Secondary | ICD-10-CM | POA: Diagnosis not present

## 2016-07-14 DIAGNOSIS — I10 Essential (primary) hypertension: Secondary | ICD-10-CM

## 2016-07-14 NOTE — Patient Instructions (Signed)

## 2016-07-14 NOTE — Progress Notes (Signed)
Cardiology Office Note  Date:  07/14/2016   ID:  DAVEDA LAROCK, DOB 06/06/1923, MRN [id number]  PCP:  Viviana Simpler, MD   Chief Complaint  Patient presents with  . other    6 mo f/u.    HPI:  Ms. Surges is a pleasant 82 yo woman, patient of Dr. Kary Kos who currently lives at [facility] with a long history of hypertension, Crohn's disease, history of dermatologic issues, smoking history for 30 years, quit in 12-04-1987, who presents for routine follow-up of her blood pressure She lives at [facility]  On her last clinic visit, metoprolol was held for bradycardia She does not remember holding the medication but it is not on her medication list Overall feeling well, recovering after the loss of her husband in early 12/04/2015 Previously had lost weight, weight has slowly been improving, starting to do more social activities She reports that she does lots of walking  Some falls, now using a cane and walker Reports that her car was taken away for safety reasons  EKG on today's visit shows normal sinus rhythm with rate 75 bpm, right bundle branch block  Other past medical history  husband passed away in early Dec 04, 2015  He had a fall, hit the back of his head, debilitated for the next month before passing.   previous  lab work shows total cholesterol 167, LDL 72, HDL 55 and 12-03-2010 , Normal renal function with creatinine 0.7  Family history; father died of stroke and atherosclerosis age 58, other died 79 from cancer   PMH:   has a past medical history of Arthritis; Cataract; Crohn's; Deviated septum; Eczema; H/O seasonal allergies; History of hemorrhoids; History of shingles; Hypertension; Psoriasis; and Vitamin B12 deficiency.  PSH:    Past Surgical History:  Procedure Laterality Date  . basel cell carcinoma    . CATARACT EXTRACTION     right eye  . CHOLECYSTECTOMY    . HYSTERECTOMY    . ILEOCECOSTOMY    . MOHS SURGERY    . NASAL SEPTUM SURGERY    . NECK SURGERY  03-Dec-2002   mass  removed from cervical spine  . NOSE SURGERY     basal cell carcinoma of nose    Current Outpatient Prescriptions  Medication Sig Dispense Refill  . amLODipine (NORVASC) 5 MG tablet TAKE ONE TABLET BY MOUTH TWICE DAILY 60 tablet 3  . aspirin 81 MG tablet Take 81 mg by mouth daily.    . Cyanocobalamin (VITAMIN B-12) 500 MCG SUBL Place 500 mcg under the tongue daily.    . CycloSPORINE (RESTASIS OP) Apply to eye 2 (two) times daily as needed.     . diazepam (VALIUM) 5 MG tablet Takes 1/2  tablet bid prn    . fish oil-omega-3 fatty acids 1000 MG capsule Take 1 g by mouth 2 (two) times daily.     . hydrALAZINE (APRESOLINE) 25 MG tablet TAKE ONE TABLET BY MOUTH TWICE DAILY 60 tablet 3  . lactase (LACTAID) 3000 units tablet Take 6,000 Units by mouth 3 (three) times daily with meals.    Marland Kitchen loperamide (IMODIUM) 2 MG capsule Take 2 mg by mouth 2 (two) times daily as needed.     Marland Kitchen losartan (COZAAR) 100 MG tablet Take 100 mg by mouth daily.    . Multiple Vitamin (MULTIVITAMIN) tablet Take 1 tablet by mouth daily.      Vladimir Faster Glycol-Propyl Glycol (SYSTANE OP) Apply to eye 4 (four) times daily.  No current facility-administered medications for this visit.      Allergies:   Lactose; Maxzide [hydrochlorothiazide w-triamterene]; Nsaids; Penicillins; and Sulfonamide derivatives   Social History:  The patient  reports that she quit smoking about 34 years ago. Her smoking use included Cigarettes. She smoked 0.50 packs per day. She has never used smokeless tobacco. She reports that she drinks alcohol. She reports that she does not use drugs.   Family History:   family history includes Stroke in her father.    Review of Systems: Review of Systems  Constitutional: Negative.   Respiratory: Negative.   Cardiovascular: Negative.   Gastrointestinal: Negative.   Musculoskeletal: Negative.   Neurological: Negative.   Psychiatric/Behavioral: Negative.   All other systems reviewed and are  negative.    PHYSICAL EXAM: VS:  BP (!) 148/72 (BP Location: Left Arm, Patient Position: Sitting, Cuff Size: Normal)   Pulse 75   Ht 5' 2"  (1.575 m)   Wt 125 lb (56.7 kg)   BMI 22.86 kg/m  , BMI Body mass index is 22.86 kg/m. GEN: Well nourished, well developed, in no acute distress , Gait instability, uses a cane  HEENT: normal  Neck: no JVD, carotid bruits, or masses Cardiac: RRR; no murmurs, rubs, or gallops,no edema  Respiratory:  clear to auscultation bilaterally, normal work of breathing GI: soft, nontender, nondistended, + BS MS: no deformity or atrophy  Skin: warm and dry, no rash Neuro:  Strength and sensation are intact Psych: euthymic mood, full affect    Recent Labs: 12/07/2015: ALT 16; BUN 16; Creatinine, Ser 0.73; Hemoglobin 14.3; Platelets SEE NOTE; Potassium 4.1; Sodium 138    Lipid Panel No results found for: CHOL, HDL, LDLCALC, TRIG    Wt Readings from Last 3 Encounters:  07/14/16 125 lb (56.7 kg)  05/22/16 121 lb (54.9 kg)  03/11/16 117 lb (53.1 kg)      ASSESSMENT AND PLAN:  Essential hypertension - Plan: EKG 12-Lead Blood pressure is well controlled on today's visit. No changes made to the medications.  Bradycardia Bradycardia resolved by holding metoprolol  Disposition:   F/U  12 months as needed   Total encounter time more than 15 minutes  Greater than 50% was spent in counseling and coordination of care with the patient    Orders Placed This Encounter  Procedures  . EKG 12-Lead     Signed, Esmond Plants, M.D., Ph.D. 07/14/2016  [place], [place]

## 2016-07-28 ENCOUNTER — Ambulatory Visit: Payer: Medicare Other | Admitting: Cardiovascular Disease

## 2016-08-28 ENCOUNTER — Non-Acute Institutional Stay: Payer: Medicare Other | Admitting: Internal Medicine

## 2016-08-28 ENCOUNTER — Encounter: Payer: Self-pay | Admitting: Internal Medicine

## 2016-08-28 VITALS — BP 130/60 | HR 78 | Temp 97.8°F | Resp 18 | Wt 126.0 lb

## 2016-08-28 DIAGNOSIS — G479 Sleep disorder, unspecified: Secondary | ICD-10-CM | POA: Diagnosis not present

## 2016-08-28 DIAGNOSIS — F39 Unspecified mood [affective] disorder: Secondary | ICD-10-CM

## 2016-08-28 DIAGNOSIS — K5090 Crohn's disease, unspecified, without complications: Secondary | ICD-10-CM

## 2016-08-28 DIAGNOSIS — M159 Polyosteoarthritis, unspecified: Secondary | ICD-10-CM

## 2016-08-28 DIAGNOSIS — I10 Essential (primary) hypertension: Secondary | ICD-10-CM

## 2016-08-28 NOTE — Assessment & Plan Note (Signed)
Mild symptoms more consistent with IBS Seems better now No meds indicated

## 2016-08-28 NOTE — Assessment & Plan Note (Signed)
BP Readings from Last 3 Encounters:  08/28/16 130/60  07/14/16 (!) 148/72  07/09/16 124/68   Good control No current symptoms

## 2016-08-28 NOTE — Assessment & Plan Note (Signed)
Mostly better from stressful last year with husband Mostly just sleep problems now

## 2016-08-28 NOTE — Assessment & Plan Note (Signed)
Will try melatonin at bedtime to see if she can stop using the diazepam

## 2016-08-28 NOTE — Assessment & Plan Note (Signed)
Mild Has tylenol for prn

## 2016-08-28 NOTE — Progress Notes (Signed)
Subjective:    Patient ID: Sabrina Hall, female    DOB: 16-Feb-1923, 82 y.o.   MRN: [id number]  HPI Assisted living visit for follow up of chronic medical condtions Reviewed recent visit with cardiologist Reviewed status with [name] RN  She reports a "couple of bad days" Appetite was off--but seems to be improving again May have a bit of flare with Crohn's--- some loose stools No blood in stool No abdominal pain  Memory seems okay Notes mild issues but no reports of problems from staff No help with ADLs No functional decline  Mood has been generally okay Some frustration with limitations Enjoys some activites--like the book club Still uses the diazepam for sleep---wonders about changing to something else  No chest pain or SOB No dizziness or syncope Slight edema at most in evening No palpitations  Current Outpatient Prescriptions on File Prior to Visit  Medication Sig Dispense Refill  . amLODipine (NORVASC) 5 MG tablet TAKE ONE TABLET BY MOUTH TWICE DAILY 60 tablet 3  . aspirin 81 MG tablet Take 81 mg by mouth daily.    . Cyanocobalamin (VITAMIN B-12) 500 MCG SUBL Place 500 mcg under the tongue daily.    . CycloSPORINE (RESTASIS OP) Apply to eye 2 (two) times daily as needed.     . diazepam (VALIUM) 5 MG tablet Takes 1/2  tablet bid prn    . fish oil-omega-3 fatty acids 1000 MG capsule Take 1 g by mouth 2 (two) times daily.     . hydrALAZINE (APRESOLINE) 25 MG tablet TAKE ONE TABLET BY MOUTH TWICE DAILY 60 tablet 3  . lactase (LACTAID) 3000 units tablet Take 6,000 Units by mouth 3 (three) times daily with meals.    Marland Kitchen loperamide (IMODIUM) 2 MG capsule Take 2 mg by mouth 2 (two) times daily as needed.     Marland Kitchen losartan (COZAAR) 100 MG tablet Take 100 mg by mouth daily.    . Multiple Vitamin (MULTIVITAMIN) tablet Take 1 tablet by mouth daily.      Vladimir Faster Glycol-Propyl Glycol (SYSTANE OP) Apply to eye 4 (four) times daily.     No current facility-administered  medications on file prior to visit.     Allergies  Allergen Reactions  . Lactose   . Maxzide [Hydrochlorothiazide W-Triamterene]   . Nsaids   . Penicillins   . Sulfonamide Derivatives     Past Medical History:  Diagnosis Date  . Arthritis   . Cataract   . Crohn's   . Deviated septum   . Eczema   . H/O seasonal allergies   . History of hemorrhoids   . History of shingles   . Hypertension   . Psoriasis   . Vitamin B12 deficiency     Past Surgical History:  Procedure Laterality Date  . basel cell carcinoma    . CATARACT EXTRACTION     right eye  . CHOLECYSTECTOMY    . HYSTERECTOMY    . ILEOCECOSTOMY    . MOHS SURGERY    . NASAL SEPTUM SURGERY    . NECK SURGERY  2004   mass removed from cervical spine  . NOSE SURGERY     basal cell carcinoma of nose    Family History  Problem Relation Age of Onset  . Stroke Father   . Cancer Neg Hx   . Diabetes Neg Hx     Social History   Social History  . Marital status: Widowed    Spouse name: N/A  .  Number of children: 2  . Years of education: N/A   Occupational History  . Schoolteacher     retired   Social History Main Topics  . Smoking status: Former Smoker    Packs/day: 0.50    Types: Cigarettes    Quit date: 09/09/1981  . Smokeless tobacco: Never Used  . Alcohol use Yes     Comment: occassionally  . Drug use: No  . Sexual activity: Not on file   Other Topics Concern  . Not on file   Social History Narrative   Has living will    Daughter is health care POA   Has DNR order--this is confirmed at visit 12/07/15   No tube feeds if cognitively unaware   Review of Systems Appetite generally okay--except for 2 days this week Really enjoys breakfast Weight stable Notices some stiffness in AM--loosens up quickly Chronic skin problems---hands/back. Does use moisturizers    Objective:   Physical Exam  Constitutional: She appears well-developed and well-nourished. No distress.  Neck: Normal range of  motion. Neck supple. No thyromegaly present.  Cardiovascular: Normal rate and regular rhythm.  Exam reveals no gallop.   Soft aortic systolic murmur  Pulmonary/Chest: Effort normal and breath sounds normal. No respiratory distress. She has no wheezes. She has no rales.  Abdominal: Soft. There is no tenderness.  Musculoskeletal: She exhibits no edema.  Lymphadenopathy:    She has no cervical adenopathy.  Skin:  Keratoses and angiomas on back  Psychiatric: She has a normal mood and affect. Her behavior is normal.          Assessment & Plan:

## 2016-08-29 DEATH — deceased

## 2016-09-01 ENCOUNTER — Encounter: Payer: Self-pay | Admitting: Internal Medicine

## 2016-10-29 ENCOUNTER — Non-Acute Institutional Stay: Payer: Medicare Other | Admitting: Internal Medicine

## 2016-10-29 ENCOUNTER — Encounter: Payer: Self-pay | Admitting: Internal Medicine

## 2016-10-29 VITALS — BP 110/50 | HR 78 | Temp 98.7°F | Resp 16 | Wt 122.0 lb

## 2016-10-29 DIAGNOSIS — K5090 Crohn's disease, unspecified, without complications: Secondary | ICD-10-CM

## 2016-10-29 DIAGNOSIS — R195 Other fecal abnormalities: Secondary | ICD-10-CM

## 2016-10-29 NOTE — Patient Instructions (Signed)
Bland Diet Introduction A bland diet consists of foods that do not have a lot of fat or fiber. Foods without fat or fiber are easier for the body to digest. They are also less likely to irritate your mouth, throat, stomach, and other parts of your gastrointestinal tract. A bland diet is sometimes called a BRAT diet. What is my plan? Your health care provider or dietitian may recommend specific changes to your diet to prevent and treat your symptoms, such as:  Eating small meals often.  Cooking food until it is soft enough to chew easily.  Chewing your food well.  Drinking fluids slowly.  Not eating foods that are very spicy, sour, or fatty.  Not eating citrus fruits, such as oranges and grapefruit. What do I need to know about this diet?  Eat a variety of foods from the bland diet food list.  Do not follow a bland diet longer than you have to.  Ask your health care provider whether you should take vitamins. What foods can I eat? Grains  Hot cereals, such as cream of wheat. Bread, crackers, or tortillas made from refined white flour. Rice. Vegetables  Canned or cooked vegetables. Mashed or boiled potatoes. Fruits  Bananas. Applesauce. Other types of cooked or canned fruit with the skin and seeds removed, such as canned peaches or pears. Meats and Other Protein Sources  Scrambled eggs. Creamy peanut butter or other nut butters. Lean, well-cooked meats, such as chicken or fish. Tofu. Soups or broths. Dairy  Low-fat dairy products, such as milk, cottage cheese, or yogurt. Beverages  Water. Herbal tea. Apple juice. Sweets and Desserts  Pudding. Custard. Fruit gelatin. Ice cream. Fats and Oils  Mild salad dressings. Canola or olive oil. The items listed above may not be a complete list of allowed foods or beverages. Contact your dietitian for more options.  What foods are not recommended? Foods and ingredients that are often not recommended include:  Spicy foods, such as hot  sauce or salsa.  Fried foods.  Sour foods, such as pickled or fermented foods.  Raw vegetables or fruits, especially citrus or berries.  Caffeinated drinks.  Alcohol.  Strongly flavored seasonings or condiments. The items listed above may not be a complete list of foods and beverages that are not allowed. Contact your dietitian for more information.  This information is not intended to replace advice given to you by your health care provider. Make sure you discuss any questions you have with your health care provider. Document Released: 01/07/2016 Document Revised: 02/21/2016 Document Reviewed: 09/27/2014  2017 Elsevier

## 2016-10-29 NOTE — Progress Notes (Signed)
Subjective:    Patient ID: Sabrina Hall, female    DOB: 1923/07/04, 83 y.o.   MRN: [id number]  HPI  Asked to evaluate resident in [address] Had GI virus last week Resident reports ongoing loose stool No nausea, vomiting, abdominal cramping, abdominal pain, or blood in the stool Has Imodium ordered but not currently taking Hx of Crohn's disease, frequent loose stools Currently not on treatment, wants referral to GI  Review of Systems      Past Medical History:  Diagnosis Date  . Arthritis   . Cataract   . Crohn's   . Deviated septum   . Eczema   . H/O seasonal allergies   . History of hemorrhoids   . History of shingles   . Hypertension   . Psoriasis   . Vitamin B12 deficiency     Current Outpatient Prescriptions  Medication Sig Dispense Refill  . amLODipine (NORVASC) 5 MG tablet TAKE ONE TABLET BY MOUTH TWICE DAILY 60 tablet 3  . aspirin 81 MG tablet Take 81 mg by mouth daily.    . Cyanocobalamin (VITAMIN B-12) 500 MCG SUBL Place 500 mcg under the tongue daily.    . CycloSPORINE (RESTASIS OP) Apply to eye 2 (two) times daily as needed.     . diazepam (VALIUM) 5 MG tablet Takes 1/2  tablet bid prn    . fish oil-omega-3 fatty acids 1000 MG capsule Take 1 g by mouth 2 (two) times daily.     . hydrALAZINE (APRESOLINE) 25 MG tablet TAKE ONE TABLET BY MOUTH TWICE DAILY 60 tablet 3  . lactase (LACTAID) 3000 units tablet Take 6,000 Units by mouth 3 (three) times daily with meals.    Marland Kitchen loperamide (IMODIUM) 2 MG capsule Take 2 mg by mouth 2 (two) times daily as needed.     Marland Kitchen losartan (COZAAR) 100 MG tablet Take 100 mg by mouth daily.    . Multiple Vitamin (MULTIVITAMIN) tablet Take 1 tablet by mouth daily.      Vladimir Faster Glycol-Propyl Glycol (SYSTANE OP) Apply to eye 4 (four) times daily.     No current facility-administered medications for this visit.     Allergies  Allergen Reactions  . Lactose   . Maxzide [Hydrochlorothiazide W-Triamterene]   . Nsaids   .  Penicillins   . Sulfonamide Derivatives     Family History  Problem Relation Age of Onset  . Stroke Father   . Cancer Neg Hx   . Diabetes Neg Hx     Social History   Social History  . Marital status: Widowed    Spouse name: N/A  . Number of children: 2  . Years of education: N/A   Occupational History  . Schoolteacher     retired   Social History Main Topics  . Smoking status: Former Smoker    Packs/day: 0.50    Types: Cigarettes    Quit date: 09/09/1981  . Smokeless tobacco: Never Used  . Alcohol use Yes     Comment: occassionally  . Drug use: No  . Sexual activity: Not on file   Other Topics Concern  . Not on file   Social History Narrative   Has living will    Daughter is health care POA   Has DNR order--this is confirmed at visit 12/07/15   No tube feeds if cognitively unaware     Constitutional: Denies fever, malaise, fatigue, headache or abrupt weight changes.  Gastrointestinal: Pt reports loose stool. Denies abdominal pain,  bloating, constipation, or blood in the stool.   No other specific complaints in a complete review of systems (except as listed in HPI above).  Objective:   Physical Exam  BP (!) 110/50   Pulse 78   Temp 98.7 F (37.1 C)   Resp 16   Wt 122 lb (55.3 kg)   BMI 22.31 kg/m  Wt Readings from Last 3 Encounters:  10/29/16 122 lb (55.3 kg)  08/28/16 126 lb (57.2 kg)  07/14/16 125 lb (56.7 kg)    General: Appears her stated age,  in NAD. Abdomen: Soft and nontender. Normal bowel sounds. No distention or masses noted.   BMET    Component Value Date/Time   NA 138 12/07/2015 1227   NA 135 (L) 10/07/2014 1142   K 4.1 12/07/2015 1227   K 3.5 10/07/2014 1142   CL 99 12/07/2015 1227   CL 96 (L) 10/07/2014 1142   CO2 31 12/07/2015 1227   CO2 31 10/07/2014 1142   GLUCOSE 85 12/07/2015 1227   GLUCOSE 252 (H) 10/07/2014 1142   BUN 16 12/07/2015 1227   BUN 16 10/07/2014 1142   CREATININE 0.73 12/07/2015 1227   CREATININE 0.96  10/07/2014 1142   CALCIUM 9.5 12/07/2015 1227   CALCIUM 8.8 10/07/2014 1142   GFRNONAA 58 (L) 10/07/2014 1142   GFRNONAA 59 (L) 05/25/2014 1136   GFRAA >60 10/07/2014 1142   GFRAA >60 05/25/2014 1136    Lipid Panel  No results found for: CHOL, TRIG, HDL, CHOLHDL, VLDL, LDLCALC  CBC    Component Value Date/Time   WBC 6.6 12/07/2015 1712   RBC 4.71 12/07/2015 1712   HGB 14.3 12/07/2015 1712   HGB 14.8 10/07/2014 1142   HCT 42.6 12/07/2015 1712   HCT 45.0 10/07/2014 1142   PLT SEE NOTE 12/07/2015 1712   PLT 169 10/07/2014 1142   MCV 90.4 12/07/2015 1712   MCV 94 10/07/2014 1142   MCH 30.4 12/07/2015 1712   MCHC 33.6 12/07/2015 1712   RDW 13.3 12/07/2015 1712   RDW 13.1 10/07/2014 1142   LYMPHSABS 1.4 12/07/2015 1712   LYMPHSABS 0.8 (L) 10/07/2014 1142   MONOABS 0.7 12/07/2015 1712   MONOABS 0.5 10/07/2014 1142   EOSABS 0.3 12/07/2015 1712   EOSABS 0.1 10/07/2014 1142   BASOSABS 0.1 12/07/2015 1712   BASOSABS 0.0 10/07/2014 1142    Hgb A1C No results found for: HGBA1C         Assessment & Plan:   Loose Stool secondary to Crohn's:  Nothing acute going on today Can take Imodium if needed Referral to GI ordered  Will reassess as needed Webb Silversmith, NP

## 2016-12-25 ENCOUNTER — Encounter: Payer: Self-pay | Admitting: Internal Medicine

## 2016-12-25 ENCOUNTER — Non-Acute Institutional Stay: Payer: Medicare Other | Admitting: Internal Medicine

## 2016-12-25 VITALS — BP 120/66 | HR 76 | Temp 97.4°F | Resp 18 | Wt 128.0 lb

## 2016-12-25 DIAGNOSIS — K5090 Crohn's disease, unspecified, without complications: Secondary | ICD-10-CM

## 2016-12-25 DIAGNOSIS — F39 Unspecified mood [affective] disorder: Secondary | ICD-10-CM | POA: Diagnosis not present

## 2016-12-25 DIAGNOSIS — I10 Essential (primary) hypertension: Secondary | ICD-10-CM | POA: Diagnosis not present

## 2016-12-25 DIAGNOSIS — G479 Sleep disorder, unspecified: Secondary | ICD-10-CM | POA: Diagnosis not present

## 2016-12-25 NOTE — Assessment & Plan Note (Signed)
BP Readings from Last 3 Encounters:  12/25/16 120/66  10/29/16 (!) 110/50  08/28/16 130/60   Good control

## 2016-12-25 NOTE — Assessment & Plan Note (Signed)
Seems to have adjusted here well No complicated grieving No meds needed

## 2016-12-25 NOTE — Assessment & Plan Note (Signed)
Doing well with the melatonin now

## 2016-12-25 NOTE — Assessment & Plan Note (Signed)
May be some better with lialda from GI No pain, blood or weight loss Chronic diarrhea

## 2016-12-25 NOTE — Progress Notes (Signed)
Subjective:    Patient ID: Sabrina Hall, female    DOB: 1923/02/11, 83 y.o.   MRN: [id number]  HPI Assisted living visit for follow up of chronic health conditions Reviewed status with [name] RN  Did have follow up visit with GI Has some bad days with her bowels Now back on meds---lialda This seems to be helping some--but not much "Almost continuous diarrhea" No sig abdominal pain No blood in stool  She feels her mood is better Has settled in here now and is satisfied Sleeping well ---now on the melatonin  No chest pain No SOB No major dizziness --occasional mild symptoms Has cane and walker---tries to walk recreationally (uses walker then--when going farther)  Current Outpatient Prescriptions on File Prior to Visit  Medication Sig Dispense Refill  . amLODipine (NORVASC) 5 MG tablet TAKE ONE TABLET BY MOUTH TWICE DAILY 60 tablet 3  . aspirin 81 MG tablet Take 81 mg by mouth daily.    . Cyanocobalamin (VITAMIN B-12) 500 MCG SUBL Place 500 mcg under the tongue daily.    . CycloSPORINE (RESTASIS OP) Apply to eye 2 (two) times daily as needed.     . diazepam (VALIUM) 5 MG tablet Takes 1/2  tablet bid prn    . fish oil-omega-3 fatty acids 1000 MG capsule Take 1 g by mouth 2 (two) times daily.     . hydrALAZINE (APRESOLINE) 25 MG tablet TAKE ONE TABLET BY MOUTH TWICE DAILY 60 tablet 3  . lactase (LACTAID) 3000 units tablet Take 6,000 Units by mouth 3 (three) times daily with meals.    Marland Kitchen loperamide (IMODIUM) 2 MG capsule Take 2 mg by mouth 2 (two) times daily as needed.     Marland Kitchen losartan (COZAAR) 100 MG tablet Take 100 mg by mouth daily.    . Multiple Vitamin (MULTIVITAMIN) tablet Take 1 tablet by mouth daily.      Vladimir Faster Glycol-Propyl Glycol (SYSTANE OP) Apply to eye 4 (four) times daily.     No current facility-administered medications on file prior to visit.     Allergies  Allergen Reactions  . Lactose   . Maxzide [Hydrochlorothiazide W-Triamterene]   . Nsaids     . Penicillins   . Sulfonamide Derivatives     Past Medical History:  Diagnosis Date  . Arthritis   . Cataract   . Crohn's   . Deviated septum   . Eczema   . H/O seasonal allergies   . History of hemorrhoids   . History of shingles   . Hypertension   . Psoriasis   . Vitamin B12 deficiency     Past Surgical History:  Procedure Laterality Date  . basel cell carcinoma    . CATARACT EXTRACTION     right eye  . CHOLECYSTECTOMY    . HYSTERECTOMY    . ILEOCECOSTOMY    . MOHS SURGERY    . NASAL SEPTUM SURGERY    . NECK SURGERY  2004   mass removed from cervical spine  . NOSE SURGERY     basal cell carcinoma of nose    Family History  Problem Relation Age of Onset  . Stroke Father   . Cancer Neg Hx   . Diabetes Neg Hx     Social History   Social History  . Marital status: Widowed    Spouse name: N/A  . Number of children: 2  . Years of education: N/A   Occupational History  . Schoolteacher  retired   Social History Main Topics  . Smoking status: Former Smoker    Packs/day: 0.50    Types: Cigarettes    Quit date: 09/09/1981  . Smokeless tobacco: Never Used  . Alcohol use Yes     Comment: occassionally  . Drug use: No  . Sexual activity: Not on file   Other Topics Concern  . Not on file   Social History Narrative   Has living will    Daughter is health care POA   Has DNR order--this is confirmed at visit 12/07/15   No tube feeds if cognitively unaware    Review of Systems Appetite is good Weight is stable No urinary problems Has stiffness and pain in AM--mostly in hips. Gone within a few minutes    Objective:   Physical Exam  Constitutional: She appears well-nourished. No distress.  Neck: No thyromegaly present.  Cardiovascular: Normal rate, regular rhythm and normal heart sounds.  Exam reveals no gallop.   No murmur heard. Pulmonary/Chest: Effort normal and breath sounds normal. No respiratory distress. She has no wheezes. She has no  rales.  Abdominal: Soft. She exhibits no distension. There is no tenderness. There is no rebound and no guarding.  Musculoskeletal: She exhibits no edema or tenderness.  Lymphadenopathy:    She has no cervical adenopathy.  Psychiatric: Her behavior is normal.          Assessment & Plan:

## 2017-03-16 ENCOUNTER — Encounter: Payer: Self-pay | Admitting: Internal Medicine

## 2017-03-18 ENCOUNTER — Encounter: Payer: Self-pay | Admitting: Internal Medicine

## 2017-03-18 ENCOUNTER — Non-Acute Institutional Stay: Payer: Medicare Other | Admitting: Internal Medicine

## 2017-03-18 DIAGNOSIS — K5090 Crohn's disease, unspecified, without complications: Secondary | ICD-10-CM

## 2017-03-18 DIAGNOSIS — F39 Unspecified mood [affective] disorder: Secondary | ICD-10-CM | POA: Diagnosis not present

## 2017-03-18 DIAGNOSIS — G479 Sleep disorder, unspecified: Secondary | ICD-10-CM | POA: Diagnosis not present

## 2017-03-18 DIAGNOSIS — I10 Essential (primary) hypertension: Secondary | ICD-10-CM

## 2017-03-18 NOTE — Assessment & Plan Note (Signed)
Currently stable Continue Lialda and Imodium

## 2017-03-18 NOTE — Progress Notes (Signed)
Subjective:    Patient ID: Sabrina Hall, female    DOB: [birth year], 82 y.o.   MRN: [id number]  HPI  Asked to see resident in ALF for routine visit.  Crohn's Disease: She denies bloody stools. She is taking Lialda daily as prescribed. She takes Imodium for times when she has bouts of diarrhea.  HTN: BP well controlled on Hydralazine, Losartan and Norvasc. She denies dizziness, headaches or chest pain.   Mood Disorder: Chronic intermittent anxiety. She takes Valium as needed with good relief.  Insomnia: She reports she has been sleeping well lately. She takes Melatonin nightly before bed.  She also c/o left shoulder pain. This started about 1 week ago. She describes the pain as sore and achy. It is located more in the left shoulder blade. The pain is worse with movement. She denies recent fall. Xray of the left shoulder revealed calcified tendonitis but no acute fracture. She has tried ice and PT with some relief.   Review of Systems  Past Medical History:  Diagnosis Date  . Arthritis   . Cataract   . Crohn's   . Deviated septum   . Eczema   . H/O seasonal allergies   . History of hemorrhoids   . History of shingles   . Hypertension   . Psoriasis   . Vitamin B12 deficiency     Current Outpatient Prescriptions  Medication Sig Dispense Refill  . amLODipine (NORVASC) 5 MG tablet TAKE ONE TABLET BY MOUTH TWICE DAILY 60 tablet 3  . aspirin 81 MG tablet Take 81 mg by mouth daily.    . Cyanocobalamin (VITAMIN B-12) 500 MCG SUBL Place 500 mcg under the tongue daily.    . CycloSPORINE (RESTASIS OP) Apply to eye 2 (two) times daily as needed.     . diazepam (VALIUM) 5 MG tablet Takes 1/2  tablet bid prn    . fish oil-omega-3 fatty acids 1000 MG capsule Take 1 g by mouth 2 (two) times daily.     . hydrALAZINE (APRESOLINE) 25 MG tablet TAKE ONE TABLET BY MOUTH TWICE DAILY 60 tablet 3  . lactase (LACTAID) 3000 units tablet Take 6,000 Units by mouth 3 (three) times daily with  meals.    Marland Kitchen loperamide (IMODIUM) 2 MG capsule Take 2 mg by mouth 2 (two) times daily as needed.     Marland Kitchen losartan (COZAAR) 100 MG tablet Take 100 mg by mouth daily.    . Melatonin 3 MG TABS Take 1 tablet by mouth at bedtime.    . mesalamine (LIALDA) 1.2 g EC tablet Take 4.8 g by mouth daily with breakfast.    . Multiple Vitamin (MULTIVITAMIN) tablet Take 1 tablet by mouth daily.      Vladimir Faster Glycol-Propyl Glycol (SYSTANE OP) Apply to eye 4 (four) times daily.     No current facility-administered medications for this visit.     Allergies  Allergen Reactions  . Lactose   . Maxzide [Hydrochlorothiazide W-Triamterene]   . Nsaids   . Penicillins   . Sulfonamide Derivatives     Family History  Problem Relation Age of Onset  . Stroke Father   . Cancer Neg Hx   . Diabetes Neg Hx     Social History   Social History  . Marital status: Widowed    Spouse name: N/A  . Number of children: 2  . Years of education: N/A   Occupational History  . Schoolteacher     retired   Social History  Main Topics  . Smoking status: Former Smoker    Packs/day: 0.50    Types: Cigarettes    Quit date: 09/09/1981  . Smokeless tobacco: Never Used  . Alcohol use Yes     Comment: occassionally  . Drug use: No  . Sexual activity: Not on file   Other Topics Concern  . Not on file   Social History Narrative   Has living will    Daughter is health care POA   Has DNR order--this is confirmed at visit 12/07/15   No tube feeds if cognitively unaware     Constitutional: Denies fever, malaise, fatigue, headache or abrupt weight changes.  HEENT: Denies eye pain, eye redness, ear pain, ringing in the ears, wax buildup, runny nose, nasal congestion, bloody nose, or sore throat. Respiratory: Denies difficulty breathing, shortness of breath, cough or sputum production.   Cardiovascular: Denies chest pain, chest tightness, palpitations or swelling in the hands or feet.  Gastrointestinal: Pt reports  intermittent diarrhea. Denies abdominal pain, bloating, constipation, or blood in the stool.  GU: Denies urgency, frequency, pain with urination, burning sensation, blood in urine, odor or discharge. Musculoskeletal: Pt reports left shoulder pain. Denies decrease in range of motion, difficulty with gait, muscle pain or joint swelling.  Skin: Denies redness, rashes, lesions or ulcercations.  Neurological: Denies dizziness, difficulty with memory, difficulty with speech or problems with balance and coordination.  Psych: Pt reports intermittent anxiety. Denies depression, SI/HI.  No other specific complaints in a complete review of systems (except as listed in HPI above).     Objective:   Physical Exam  BP (!) 116/56   Pulse 74   Wt 126 lb (57.2 kg)   BMI 23.05 kg/m  Wt Readings from Last 3 Encounters:  03/18/17 126 lb (57.2 kg)  12/25/16 128 lb (58.1 kg)  10/29/16 122 lb (55.3 kg)    General: Appears her stated age, in NAD. Skin: Warm, dry and intact. No bruising, redness, or warmth noted of left shoulder. Cardiovascular: Normal rate and rhythm. S1,S2 noted.  No murmur, rubs or gallops noted.  Pulmonary/Chest: Normal effort and positive vesicular breath sounds. No respiratory distress. No wheezes, rales or ronchi noted.  Abdomen: Soft and nontender. Normal bowel sounds. No distention or masses noted.  Musculoskeletal: Pain with internal rotation of the left shoulder. Normal external rotation, abduction and adduction. No pain with palpation of the left shoulder. Pain with palpation over the left side of the trapezius. Strength equal BUE. Neurological: Alert and oriented.  Psychiatric: Mood and affect normal.     BMET    Component Value Date/Time   NA 138 12/07/2015 1227   NA 135 (L) 10/07/2014 1142   K 4.1 12/07/2015 1227   K 3.5 10/07/2014 1142   CL 99 12/07/2015 1227   CL 96 (L) 10/07/2014 1142   CO2 31 12/07/2015 1227   CO2 31 10/07/2014 1142   GLUCOSE 85 12/07/2015 1227    GLUCOSE 252 (H) 10/07/2014 1142   BUN 16 12/07/2015 1227   BUN 16 10/07/2014 1142   CREATININE 0.73 12/07/2015 1227   CREATININE 0.96 10/07/2014 1142   CALCIUM 9.5 12/07/2015 1227   CALCIUM 8.8 10/07/2014 1142   GFRNONAA 58 (L) 10/07/2014 1142   GFRNONAA 59 (L) 05/25/2014 1136   GFRAA >60 10/07/2014 1142   GFRAA >60 05/25/2014 1136    Lipid Panel  No results found for: CHOL, TRIG, HDL, CHOLHDL, VLDL, LDLCALC  CBC    Component Value Date/Time  WBC 6.6 12/07/2015 1712   RBC 4.71 12/07/2015 1712   HGB 14.3 12/07/2015 1712   HGB 14.8 10/07/2014 1142   HCT 42.6 12/07/2015 1712   HCT 45.0 10/07/2014 1142   PLT SEE NOTE 12/07/2015 1712   PLT 169 10/07/2014 1142   MCV 90.4 12/07/2015 1712   MCV 94 10/07/2014 1142   MCH 30.4 12/07/2015 1712   MCHC 33.6 12/07/2015 1712   RDW 13.3 12/07/2015 1712   RDW 13.1 10/07/2014 1142   LYMPHSABS 1.4 12/07/2015 1712   LYMPHSABS 0.8 (L) 10/07/2014 1142   MONOABS 0.7 12/07/2015 1712   MONOABS 0.5 10/07/2014 1142   EOSABS 0.3 12/07/2015 1712   EOSABS 0.1 10/07/2014 1142   BASOSABS 0.1 12/07/2015 1712   BASOSABS 0.0 10/07/2014 1142    Hgb A1C No results found for: HGBA1C          Assessment & Plan:   Left Trapezius Strain:  Advised her to try heat instead of ice She has Tylenol to take if needed for the pain Continue to work with PT  Will reassess as needed Webb Silversmith, NP

## 2017-03-18 NOTE — Patient Instructions (Signed)
Shoulder Exercises Ask your health care provider which exercises are safe for you. Do exercises exactly as told by your health care provider and adjust them as directed. It is normal to feel mild stretching, pulling, tightness, or discomfort as you do these exercises, but you should stop right away if you feel sudden pain or your pain gets worse.Do not begin these exercises until told by your health care provider. RANGE OF MOTION EXERCISES These exercises warm up your muscles and joints and improve the movement and flexibility of your shoulder. These exercises also help to relieve pain, numbness, and tingling. These exercises involve stretching your injured shoulder directly. Exercise A: Pendulum  1. Stand near a wall or a surface that you can hold onto for balance. 2. Bend at the waist and let your left / right arm hang straight down. Use your other arm to support you. Keep your back straight and do not lock your knees. 3. Relax your left / right arm and shoulder muscles, and move your hips and your trunk so your left / right arm swings freely. Your arm should swing because of the motion of your body, not because you are using your arm or shoulder muscles. 4. Keep moving your body so your arm swings in the following directions, as told by your health care provider: ? Side to side. ? Forward and backward. ? In clockwise and counterclockwise circles. 5. Continue each motion for __________ seconds, or for as long as told by your health care provider. 6. Slowly return to the starting position. Repeat __________ times. Complete this exercise __________ times a day. Exercise B:Flexion, Standing  1. Stand and hold a broomstick, a cane, or a similar object. Place your hands a little more than shoulder-width apart on the object. Your left / right hand should be palm-up, and your other hand should be palm-down. 2. Keep your elbow straight and keep your shoulder muscles relaxed. Push the stick down with  your healthy arm to raise your left / right arm in front of your body, and then over your head until you feel a stretch in your shoulder. ? Avoid shrugging your shoulder while you raise your arm. Keep your shoulder blade tucked down toward the middle of your back. 3. Hold for __________ seconds. 4. Slowly return to the starting position. Repeat __________ times. Complete this exercise __________ times a day. Exercise C: Abduction, Standing 1. Stand and hold a broomstick, a cane, or a similar object. Place your hands a little more than shoulder-width apart on the object. Your left / right hand should be palm-up, and your other hand should be palm-down. 2. While keeping your elbow straight and your shoulder muscles relaxed, push the stick across your body toward your left / right side. Raise your left / right arm to the side of your body and then over your head until you feel a stretch in your shoulder. ? Do not raise your arm above shoulder height, unless your health care provider tells you to do that. ? Avoid shrugging your shoulder while you raise your arm. Keep your shoulder blade tucked down toward the middle of your back. 3. Hold for __________ seconds. 4. Slowly return to the starting position. Repeat __________ times. Complete this exercise __________ times a day. Exercise D:Internal Rotation  1. Place your left / right hand behind your back, palm-up. 2. Use your other hand to dangle an exercise band, a towel, or a similar object over your shoulder. Grasp the band with   your left / right hand so you are holding onto both ends. 3. Gently pull up on the band until you feel a stretch in the front of your left / right shoulder. ? Avoid shrugging your shoulder while you raise your arm. Keep your shoulder blade tucked down toward the middle of your back. 4. Hold for __________ seconds. 5. Release the stretch by letting go of the band and lowering your hands. Repeat __________ times. Complete  this exercise __________ times a day. STRETCHING EXERCISES These exercises warm up your muscles and joints and improve the movement and flexibility of your shoulder. These exercises also help to relieve pain, numbness, and tingling. These exercises are done using your healthy shoulder to help stretch the muscles of your injured shoulder. Exercise E: Corner Stretch (External Rotation and Abduction)  1. Stand in a doorway with one of your feet slightly in front of the other. This is called a staggered stance. If you cannot reach your forearms to the door frame, stand facing a corner of a room. 2. Choose one of the following positions as told by your health care provider: ? Place your hands and forearms on the door frame above your head. ? Place your hands and forearms on the door frame at the height of your head. ? Place your hands on the door frame at the height of your elbows. 3. Slowly move your weight onto your front foot until you feel a stretch across your chest and in the front of your shoulders. Keep your head and chest upright and keep your abdominal muscles tight. 4. Hold for __________ seconds. 5. To release the stretch, shift your weight to your back foot. Repeat __________ times. Complete this stretch __________ times a day. Exercise F:Extension, Standing 1. Stand and hold a broomstick, a cane, or a similar object behind your back. ? Your hands should be a little wider than shoulder-width apart. ? Your palms should face away from your back. 2. Keeping your elbows straight and keeping your shoulder muscles relaxed, move the stick away from your body until you feel a stretch in your shoulder. ? Avoid shrugging your shoulders while you move the stick. Keep your shoulder blade tucked down toward the middle of your back. 3. Hold for __________ seconds. 4. Slowly return to the starting position. Repeat __________ times. Complete this exercise __________ times a day. STRENGTHENING  EXERCISES These exercises build strength and endurance in your shoulder. Endurance is the ability to use your muscles for a long time, even after they get tired. Exercise G:External Rotation  1. Sit in a stable chair without armrests. 2. Secure an exercise band at elbow height on your left / right side. 3. Place a soft object, such as a folded towel or a small pillow, between your left / right upper arm and your body to move your elbow a few inches away (about 10 cm) from your side. 4. Hold the end of the band so it is tight and there is no slack. 5. Keeping your elbow pressed against the soft object, move your left / right forearm out, away from your abdomen. Keep your body steady so only your forearm moves. 6. Hold for __________ seconds. 7. Slowly return to the starting position. Repeat __________ times. Complete this exercise __________ times a day. Exercise H:Shoulder Abduction  1. Sit in a stable chair without armrests, or stand. 2. Hold a __________ weight in your left / right hand, or hold an exercise band with both hands.   3. Start with your arms straight down and your left / right palm facing in, toward your body. 4. Slowly lift your left / right hand out to your side. Do not lift your hand above shoulder height unless your health care provider tells you that this is safe. ? Keep your arms straight. ? Avoid shrugging your shoulder while you do this movement. Keep your shoulder blade tucked down toward the middle of your back. 5. Hold for __________ seconds. 6. Slowly lower your arm, and return to the starting position. Repeat __________ times. Complete this exercise __________ times a day. Exercise I:Shoulder Extension 1. Sit in a stable chair without armrests, or stand. 2. Secure an exercise band to a stable object in front of you where it is at shoulder height. 3. Hold one end of the exercise band in each hand. Your palms should face each other. 4. Straighten your elbows and  lift your hands up to shoulder height. 5. Step back, away from the secured end of the exercise band, until the band is tight and there is no slack. 6. Squeeze your shoulder blades together as you pull your hands down to the sides of your thighs. Stop when your hands are straight down by your sides. Do not let your hands go behind your body. 7. Hold for __________ seconds. 8. Slowly return to the starting position. Repeat __________ times. Complete this exercise __________ times a day. Exercise J:Standing Shoulder Row 1. Sit in a stable chair without armrests, or stand. 2. Secure an exercise band to a stable object in front of you so it is at waist height. 3. Hold one end of the exercise band in each hand. Your palms should be in a thumbs-up position. 4. Bend each of your elbows to an "L" shape (about 90 degrees) and keep your upper arms at your sides. 5. Step back until the band is tight and there is no slack. 6. Slowly pull your elbows back behind you. 7. Hold for __________ seconds. 8. Slowly return to the starting position. Repeat __________ times. Complete this exercise __________ times a day. Exercise K:Shoulder Press-Ups  1. Sit in a stable chair that has armrests. Sit upright, with your feet flat on the floor. 2. Put your hands on the armrests so your elbows are bent and your fingers are pointing forward. Your hands should be about even with the sides of your body. 3. Push down on the armrests and use your arms to lift yourself off of the chair. Straighten your elbows and lift yourself up as much as you comfortably can. ? Move your shoulder blades down, and avoid letting your shoulders move up toward your ears. ? Keep your feet on the ground. As you get stronger, your feet should support less of your body weight as you lift yourself up. 4. Hold for __________ seconds. 5. Slowly lower yourself back into the chair. Repeat __________ times. Complete this exercise __________ times a  day. Exercise L: Wall Push-Ups  1. Stand so you are facing a stable wall. Your feet should be about one arm-length away from the wall. 2. Lean forward and place your palms on the wall at shoulder height. 3. Keep your feet flat on the floor as you bend your elbows and lean forward toward the wall. 4. Hold for __________ seconds. 5. Straighten your elbows to push yourself back to the starting position. Repeat __________ times. Complete this exercise __________ times a day. This information is not intended to replace advice   given to you by your health care provider. Make sure you discuss any questions you have with your health care provider. Document Released: 07/30/2005 Document Revised: 06/09/2016 Document Reviewed: 05/27/2015 Elsevier Interactive Patient Education  2018 [employer]  

## 2017-03-18 NOTE — Assessment & Plan Note (Signed)
Controlled Continue Hydralazine, Losartan and Amlodipine

## 2017-03-18 NOTE — Assessment & Plan Note (Signed)
Continue Melatonin

## 2017-03-18 NOTE — Assessment & Plan Note (Signed)
Chronic anxiety Continue prn Valium

## 2017-03-24 ENCOUNTER — Encounter: Payer: Self-pay | Admitting: Emergency Medicine

## 2017-03-24 ENCOUNTER — Inpatient Hospital Stay
Admission: EM | Admit: 2017-03-24 | Discharge: 2017-03-27 | DRG: 641 | Disposition: A | Discharge status: Other Acute Inpatient Hospital | Payer: Medicare Other | Attending: Internal Medicine | Admitting: Internal Medicine

## 2017-03-24 DIAGNOSIS — F0390 Unspecified dementia without behavioral disturbance: Secondary | ICD-10-CM | POA: Diagnosis present

## 2017-03-24 DIAGNOSIS — E876 Hypokalemia: Secondary | ICD-10-CM | POA: Diagnosis present

## 2017-03-24 DIAGNOSIS — Z87891 Personal history of nicotine dependence: Secondary | ICD-10-CM

## 2017-03-24 DIAGNOSIS — Z7982 Long term (current) use of aspirin: Secondary | ICD-10-CM

## 2017-03-24 DIAGNOSIS — Z66 Do not resuscitate: Secondary | ICD-10-CM | POA: Diagnosis present

## 2017-03-24 DIAGNOSIS — Z79899 Other long term (current) drug therapy: Secondary | ICD-10-CM

## 2017-03-24 DIAGNOSIS — R112 Nausea with vomiting, unspecified: Secondary | ICD-10-CM

## 2017-03-24 DIAGNOSIS — Z882 Allergy status to sulfonamides status: Secondary | ICD-10-CM

## 2017-03-24 DIAGNOSIS — I10 Essential (primary) hypertension: Secondary | ICD-10-CM | POA: Diagnosis present

## 2017-03-24 DIAGNOSIS — Z9841 Cataract extraction status, right eye: Secondary | ICD-10-CM

## 2017-03-24 DIAGNOSIS — K5090 Crohn's disease, unspecified, without complications: Secondary | ICD-10-CM | POA: Diagnosis present

## 2017-03-24 DIAGNOSIS — R197 Diarrhea, unspecified: Secondary | ICD-10-CM

## 2017-03-24 DIAGNOSIS — Z886 Allergy status to analgesic agent status: Secondary | ICD-10-CM

## 2017-03-24 DIAGNOSIS — E871 Hypo-osmolality and hyponatremia: Principal | ICD-10-CM | POA: Diagnosis present

## 2017-03-24 DIAGNOSIS — Z90710 Acquired absence of both cervix and uterus: Secondary | ICD-10-CM

## 2017-03-24 DIAGNOSIS — N179 Acute kidney failure, unspecified: Secondary | ICD-10-CM | POA: Diagnosis present

## 2017-03-24 DIAGNOSIS — E860 Dehydration: Secondary | ICD-10-CM | POA: Diagnosis present

## 2017-03-24 DIAGNOSIS — Z91011 Allergy to milk products: Secondary | ICD-10-CM

## 2017-03-24 DIAGNOSIS — Z880 Allergy status to penicillin: Secondary | ICD-10-CM

## 2017-03-24 LAB — COMPREHENSIVE METABOLIC PANEL
ALBUMIN: 3.1 g/dL — AB (ref 3.5–5.0)
ALT: 17 U/L (ref 14–54)
ANION GAP: 10 (ref 5–15)
AST: 19 U/L (ref 15–41)
Alkaline Phosphatase: 63 U/L (ref 38–126)
BILIRUBIN TOTAL: 0.8 mg/dL (ref 0.3–1.2)
BUN: 48 mg/dL — ABNORMAL HIGH (ref 6–20)
CO2: 18 mmol/L — ABNORMAL LOW (ref 22–32)
Calcium: 7.8 mg/dL — ABNORMAL LOW (ref 8.9–10.3)
Chloride: 93 mmol/L — ABNORMAL LOW (ref 101–111)
Creatinine, Ser: 1.75 mg/dL — ABNORMAL HIGH (ref 0.44–1.00)
GFR calc non Af Amer: 24 mL/min — ABNORMAL LOW (ref >60)
GFR, EST AFRICAN AMERICAN: 28 mL/min — AB (ref >60)
GLUCOSE: 128 mg/dL — AB (ref 65–99)
Potassium: 2 mmol/L — CL (ref 3.5–5.1)
Sodium: 121 mmol/L — ABNORMAL LOW (ref 135–145)
TOTAL PROTEIN: 6.3 g/dL — AB (ref 6.5–8.1)

## 2017-03-24 LAB — URINALYSIS, COMPLETE (UACMP) WITH MICROSCOPIC
BILIRUBIN URINE: NEGATIVE
GLUCOSE, UA: NEGATIVE mg/dL
HGB URINE DIPSTICK: NEGATIVE
Ketones, ur: NEGATIVE mg/dL
NITRITE: NEGATIVE
PH: 5 (ref 5.0–8.0)
Protein, ur: NEGATIVE mg/dL
SPECIFIC GRAVITY, URINE: 1.013 (ref 1.005–1.030)

## 2017-03-24 LAB — CBC
HEMATOCRIT: 32.7 % — AB (ref 35.0–47.0)
HEMOGLOBIN: 10.3 g/dL — AB (ref 12.0–16.0)
MCH: 20.4 pg — ABNORMAL LOW (ref 26.0–34.0)
MCHC: 31.3 g/dL — AB (ref 32.0–36.0)
MCV: 65 fL — ABNORMAL LOW (ref 80.0–100.0)
Platelets: 257 10*3/uL (ref 150–440)
RBC: 5.03 MIL/uL (ref 3.80–5.20)
RDW: 19.9 % — ABNORMAL HIGH (ref 11.5–14.5)
WBC: 10.5 10*3/uL (ref 3.6–11.0)

## 2017-03-24 LAB — BASIC METABOLIC PANEL
ANION GAP: 9 (ref 5–15)
BUN: 46 mg/dL — ABNORMAL HIGH (ref 6–20)
CHLORIDE: 96 mmol/L — AB (ref 101–111)
CO2: 18 mmol/L — AB (ref 22–32)
CREATININE: 1.63 mg/dL — AB (ref 0.44–1.00)
Calcium: 7.4 mg/dL — ABNORMAL LOW (ref 8.9–10.3)
GFR calc non Af Amer: 26 mL/min — ABNORMAL LOW (ref >60)
GFR, EST AFRICAN AMERICAN: 31 mL/min — AB (ref >60)
Glucose, Bld: 117 mg/dL — ABNORMAL HIGH (ref 65–99)
POTASSIUM: 2.2 mmol/L — AB (ref 3.5–5.1)
SODIUM: 123 mmol/L — AB (ref 135–145)

## 2017-03-24 LAB — LIPASE, BLOOD: Lipase: 25 U/L (ref 11–51)

## 2017-03-24 LAB — TROPONIN I: Troponin I: <0.03 ng/mL (ref <0.03)

## 2017-03-24 LAB — MAGNESIUM: Magnesium: 1.4 mg/dL — ABNORMAL LOW (ref 1.7–2.4)

## 2017-03-24 MED ORDER — POTASSIUM CHLORIDE 10 MEQ/100ML IV SOLN
10.0000 meq | Freq: Once | INTRAVENOUS | Status: AC
  Administered 2017-03-24: 10 meq via INTRAVENOUS
  Filled 2017-03-24: qty 100

## 2017-03-24 MED ORDER — ONDANSETRON HCL 4 MG/2ML IJ SOLN
4.0000 mg | Freq: Once | INTRAMUSCULAR | Status: AC
  Administered 2017-03-24: 4 mg via INTRAVENOUS
  Filled 2017-03-24: qty 2

## 2017-03-24 MED ORDER — SODIUM CHLORIDE 0.9 % IV SOLN
Freq: Once | INTRAVENOUS | Status: AC
  Administered 2017-03-24: 23:00:00 via INTRAVENOUS

## 2017-03-24 MED ORDER — SODIUM CHLORIDE 0.9 % IV BOLUS (SEPSIS)
1000.0000 mL | Freq: Once | INTRAVENOUS | Status: AC
  Administered 2017-03-24: 1000 mL via INTRAVENOUS

## 2017-03-24 MED ORDER — POTASSIUM CHLORIDE CRYS ER 20 MEQ PO TBCR
20.0000 meq | EXTENDED_RELEASE_TABLET | ORAL | Status: DC | PRN
  Administered 2017-03-24: 20 meq via ORAL
  Filled 2017-03-24 (×2): qty 1

## 2017-03-24 NOTE — ED Provider Notes (Signed)
[facility] Emergency Department Provider Note  Time seen: 10:08 PM  I have reviewed the triage vital signs and the nursing notes.   HISTORY  Chief Complaint Diarrhea and Vomiting    HPI Sabrina Hall is a 83 y.o. female with a past medical history of Crohn's, hypertension, presents to the emergency department for reported vomiting, diarrhea and confusion. According to EMS reported the patient lives at an assisted living facility where she was found to be nauseated with vomiting today and has been more fatigued lying in bed today. Also state confusion at times. Here the patient is awake, alert, appears oriented. Patient states she vomited this morning at breakfast this but denies any diarrhea. Denies any abdominal pain. Denies any chest pain. EMS reports initial hypotension, normal tensive upon arrival.  Past Medical History:  Diagnosis Date  . Arthritis   . Cataract   . Crohn's   . Deviated septum   . Eczema   . H/O seasonal allergies   . History of hemorrhoids   . History of shingles   . Hypertension   . Psoriasis   . Vitamin B12 deficiency     Patient Active Problem List   Diagnosis Date Noted  . Vitamin B12 deficiency   . Mood disorder ([place]) 01/31/2016  . Sleep disorder 12/07/2015  . Bradycardia 02/28/2013  . POLYP, COLON 11/22/2007  . Essential hypertension 11/22/2007  . Crohn's disease ([place]) 11/22/2007  . DIVERTICULITIS OF COLON 11/22/2007  . Generalized osteoarthritis of multiple sites 11/22/2007  . SKIN CANCER, HX OF 11/22/2007    Past Surgical History:  Procedure Laterality Date  . basel cell carcinoma    . CATARACT EXTRACTION     right eye  . CHOLECYSTECTOMY    . HYSTERECTOMY    . ILEOCECOSTOMY    . MOHS SURGERY    . NASAL SEPTUM SURGERY    . NECK SURGERY  2004   mass removed from cervical spine  . NOSE SURGERY     basal cell carcinoma of nose    Prior to Admission medications   Medication Sig Start Date End Date  Taking? Authorizing Provider  amLODipine (NORVASC) 5 MG tablet TAKE ONE TABLET BY MOUTH TWICE DAILY 08/27/15   Minna Merritts, MD  aspirin 81 MG tablet Take 81 mg by mouth daily.    [provider]  Cyanocobalamin (VITAMIN B-12) 500 MCG SUBL Place 500 mcg under the tongue daily.    [provider]  CycloSPORINE (RESTASIS OP) Apply to eye 2 (two) times daily as needed.     [provider]  diazepam (VALIUM) 5 MG tablet Takes 1/2  tablet bid prn    [provider]  fish oil-omega-3 fatty acids 1000 MG capsule Take 1 g by mouth 2 (two) times daily.     [provider]  hydrALAZINE (APRESOLINE) 25 MG tablet TAKE ONE TABLET BY MOUTH TWICE DAILY 08/27/15   Minna Merritts, MD  lactase (LACTAID) 3000 units tablet Take 6,000 Units by mouth 3 (three) times daily with meals.    [provider]  loperamide (IMODIUM) 2 MG capsule Take 2 mg by mouth 2 (two) times daily as needed.     [provider]  losartan (COZAAR) 100 MG tablet Take 100 mg by mouth daily.    [provider]  Melatonin 3 MG TABS Take 1 tablet by mouth at bedtime.    [provider]  mesalamine (LIALDA) 1.2 g EC tablet Take 4.8 g  by mouth daily with breakfast.    [provider]  Multiple Vitamin (MULTIVITAMIN) tablet Take 1 tablet by mouth daily.      [provider]  Polyethyl Glycol-Propyl Glycol (SYSTANE OP) Apply to eye 4 (four) times daily.    [provider]    Allergies  Allergen Reactions  . Lactose   . Maxzide [Hydrochlorothiazide W-Triamterene]   . Nsaids   . Penicillins   . Sulfonamide Derivatives     Family History  Problem Relation Age of Onset  . Stroke Father   . Cancer Neg Hx   . Diabetes Neg Hx     Social History Social History  Substance Use Topics  . Smoking status: Former Smoker    Packs/day: 0.50    Types: Cigarettes    Quit date: 09/09/1981  . Smokeless tobacco: Never Used  . Alcohol  use Yes     Comment: occassionally    Review of Systems Constitutional: Negative for fever. Cardiovascular: Negative for chest pain. Respiratory: Negative for shortness of breath. Gastrointestinal: Negative for abdominal pain. Positive for nausea and vomiting this morning per patient. Patient denies diarrhea. Genitourinary: Negative for dysuria. Neurological: Negative for headache All other ROS negative  ____________________________________________   PHYSICAL EXAM:  VITAL SIGNS: ED Triage Vitals  Enc Vitals Group     BP 03/24/17 2153 (!) 120/50     Pulse Rate 03/24/17 2153 73     Resp 03/24/17 2153 16     Temp 03/24/17 2153 97.7 F (36.5 C)     Temp Source 03/24/17 2153 Oral     SpO2 03/24/17 2153 93 %     Weight 03/24/17 2146 128 lb 1.6 oz (58.1 kg)     Height 03/24/17 2146 5' (1.524 m)     Head Circumference --      Peak Flow --      Pain Score --      Pain Loc --      Pain Edu? --      Excl. in [place]? --     Constitutional: Alert. Well appearing and in no distress. Eyes: Normal exam ENT   Head: Normocephalic and atraumatic.   Mouth/Throat: Mucous membranes are moist. Cardiovascular: Normal rate, regular rhythm. No murmur Respiratory: Normal respiratory effort without tachypnea nor retractions. Breath sounds are clear  Gastrointestinal: Soft and nontender. No distention.   Musculoskeletal: Nontender with normal range of motion in all extremities.  Neurologic:  Normal speech and language. No gross focal neurologic deficits Skin:  Skin is warm, dry and intact.  Psychiatric: Mood and affect are normal.   ____________________________________________    EKG  EKG reviewed and interpreted by myself shows an unclear rhythm at 83 bpm, narrow QRS, normal axis, prolonged QTC 556 ms, nonspecific ST changes. No ST elevation.  ____________________________________________    INITIAL IMPRESSION / ASSESSMENT AND PLAN / ED COURSE  Pertinent labs & imaging results  that were available during my care of the patient were reviewed by me and considered in my medical decision making (see chart for details).  Patient presents the emergency department with reported nausea vomiting diarrhea and confusion from her nursing facility. Here the patient appears alert, answering questions appropriately. Patient states nausea and vomiting today does not recall any episodes of diarrhea. Denies any abdominal pain or chest pain. Patient's vitals are largely normal, afebrile with a normal heart rate, normal blood pressure. We will IV hydrate, check labs, urinalysis, and closely monitored.  Patient's labs have resulted grossly abnormal  with hyponatremia and hypokalemia. We'll begin repletion. Patient's hemoglobin has dropped nearly 4 points as well. Rectal exam shows light brown stool which is guaiac negative. Given the patient's hyponatremia with hypokalemia with reported nausea vomiting and diarrhea all day today per EMS, we will admit to the hospital for further treatment  ____________________________________________   FINAL CLINICAL IMPRESSION(S) / ED DIAGNOSES  Nausea vomiting diarrhea Hyponatremia Hypokalemia   Harvest Dark, MD 03/24/17 2332

## 2017-03-24 NOTE — ED Triage Notes (Signed)
Pt arrived via ems from [facility]. EMS reports n/v/d since last night that  And a report from facility of "altered mental status." No further details available from facilty on why the pt was considered to be alter other than "talking out of her head."EMS states facility called ems last night but pt did not want to come to the hospital. Pt was receptive to coming to the hospital today. Pt reports having "just a couple' episodes of diarrhea today but also reports that she hasn't had anything to eat today. Upon arrival pt denies any pain and is alert and oriented. EMS reported hypotension however upon arrival pt's blood pressure WDL. Pt was given 19m of zofran and 300cc of fluid in route

## 2017-03-25 DIAGNOSIS — E871 Hypo-osmolality and hyponatremia: Secondary | ICD-10-CM

## 2017-03-25 DIAGNOSIS — I10 Essential (primary) hypertension: Secondary | ICD-10-CM | POA: Diagnosis present

## 2017-03-25 DIAGNOSIS — Z91011 Allergy to milk products: Secondary | ICD-10-CM | POA: Diagnosis not present

## 2017-03-25 DIAGNOSIS — Z7982 Long term (current) use of aspirin: Secondary | ICD-10-CM | POA: Diagnosis not present

## 2017-03-25 DIAGNOSIS — Z90710 Acquired absence of both cervix and uterus: Secondary | ICD-10-CM | POA: Diagnosis not present

## 2017-03-25 DIAGNOSIS — Z79899 Other long term (current) drug therapy: Secondary | ICD-10-CM | POA: Diagnosis not present

## 2017-03-25 DIAGNOSIS — Z882 Allergy status to sulfonamides status: Secondary | ICD-10-CM | POA: Diagnosis not present

## 2017-03-25 DIAGNOSIS — Z880 Allergy status to penicillin: Secondary | ICD-10-CM | POA: Diagnosis not present

## 2017-03-25 DIAGNOSIS — F0390 Unspecified dementia without behavioral disturbance: Secondary | ICD-10-CM | POA: Diagnosis present

## 2017-03-25 DIAGNOSIS — Z9841 Cataract extraction status, right eye: Secondary | ICD-10-CM | POA: Diagnosis not present

## 2017-03-25 DIAGNOSIS — Z66 Do not resuscitate: Secondary | ICD-10-CM | POA: Diagnosis present

## 2017-03-25 DIAGNOSIS — Z87891 Personal history of nicotine dependence: Secondary | ICD-10-CM | POA: Diagnosis not present

## 2017-03-25 DIAGNOSIS — E860 Dehydration: Secondary | ICD-10-CM | POA: Diagnosis present

## 2017-03-25 DIAGNOSIS — E876 Hypokalemia: Secondary | ICD-10-CM | POA: Diagnosis present

## 2017-03-25 DIAGNOSIS — Z886 Allergy status to analgesic agent status: Secondary | ICD-10-CM | POA: Diagnosis not present

## 2017-03-25 DIAGNOSIS — K5090 Crohn's disease, unspecified, without complications: Secondary | ICD-10-CM | POA: Diagnosis present

## 2017-03-25 LAB — GASTROINTESTINAL PANEL BY PCR, STOOL (REPLACES STOOL CULTURE)

## 2017-03-25 LAB — BASIC METABOLIC PANEL
ANION GAP: 5 (ref 5–15)
BUN: 40 mg/dL — ABNORMAL HIGH (ref 6–20)
CALCIUM: 7.4 mg/dL — AB (ref 8.9–10.3)
CO2: 18 mmol/L — AB (ref 22–32)
Chloride: 103 mmol/L (ref 101–111)
Creatinine, Ser: 1.18 mg/dL — ABNORMAL HIGH (ref 0.44–1.00)
GFR, EST AFRICAN AMERICAN: 45 mL/min — AB (ref >60)
GFR, EST NON AFRICAN AMERICAN: 39 mL/min — AB (ref >60)
GLUCOSE: 105 mg/dL — AB (ref 65–99)
POTASSIUM: 2.7 mmol/L — AB (ref 3.5–5.1)
Sodium: 126 mmol/L — ABNORMAL LOW (ref 135–145)

## 2017-03-25 LAB — C DIFFICILE QUICK SCREEN W PCR REFLEX
C DIFFICILE (CDIFF) TOXIN: NEGATIVE
C DIFFICLE (CDIFF) ANTIGEN: NEGATIVE
C Diff interpretation: NOT DETECTED

## 2017-03-25 LAB — CBC
HCT: 32.4 % — ABNORMAL LOW (ref 35.0–47.0)
HEMOGLOBIN: 11.4 g/dL — AB (ref 12.0–16.0)
MCH: 30.5 pg (ref 26.0–34.0)
MCHC: 35.2 g/dL (ref 32.0–36.0)
MCV: 86.4 fL (ref 80.0–100.0)
Platelets: 193 10*3/uL (ref 150–440)
RBC: 3.75 MIL/uL — AB (ref 3.80–5.20)
RDW: 13.2 % (ref 11.5–14.5)
WBC: 9.8 10*3/uL (ref 3.6–11.0)

## 2017-03-25 LAB — PHOSPHORUS: PHOSPHORUS: 2.9 mg/dL (ref 2.5–4.6)

## 2017-03-25 LAB — POTASSIUM: Potassium: 3.4 mmol/L — ABNORMAL LOW (ref 3.5–5.1)

## 2017-03-25 LAB — MAGNESIUM
MAGNESIUM: 2.7 mg/dL — AB (ref 1.7–2.4)
Magnesium: 1.4 mg/dL — ABNORMAL LOW (ref 1.7–2.4)

## 2017-03-25 MED ORDER — SENNOSIDES-DOCUSATE SODIUM 8.6-50 MG PO TABS: 1.0000 | ORAL_TABLET | Freq: Every evening | ORAL | Status: DC | PRN

## 2017-03-25 MED ORDER — SODIUM CHLORIDE 0.9 % IV SOLN
INTRAVENOUS | Status: DC
  Administered 2017-03-25: 03:00:00 via INTRAVENOUS

## 2017-03-25 MED ORDER — ACETAMINOPHEN 325 MG PO TABS: 650.0000 mg | ORAL_TABLET | Freq: Four times a day (QID) | ORAL | Status: DC | PRN

## 2017-03-25 MED ORDER — AMLODIPINE BESYLATE 5 MG PO TABS
5.0000 mg | ORAL_TABLET | Freq: Two times a day (BID) | ORAL | Status: DC
  Administered 2017-03-26 – 2017-03-27 (×3): 5 mg via ORAL
  Filled 2017-03-25 (×4): qty 1

## 2017-03-25 MED ORDER — ENOXAPARIN SODIUM 30 MG/0.3ML ~~LOC~~ SOLN: 30.0000 mg | SUBCUTANEOUS | Status: DC

## 2017-03-25 MED ORDER — POTASSIUM CHLORIDE CRYS ER 20 MEQ PO TBCR
40.0000 meq | EXTENDED_RELEASE_TABLET | Freq: Two times a day (BID) | ORAL | Status: AC
  Administered 2017-03-25: 40 meq via ORAL
  Filled 2017-03-25: qty 2

## 2017-03-25 MED ORDER — POTASSIUM CHLORIDE 10 MEQ/100ML IV SOLN
10.0000 meq | INTRAVENOUS | Status: AC
  Administered 2017-03-25 (×4): 10 meq via INTRAVENOUS
  Filled 2017-03-25 (×4): qty 100

## 2017-03-25 MED ORDER — LOSARTAN POTASSIUM 50 MG PO TABS
100.0000 mg | ORAL_TABLET | Freq: Every day | ORAL | Status: DC
  Administered 2017-03-26 – 2017-03-27 (×2): 100 mg via ORAL
  Filled 2017-03-25 (×3): qty 2

## 2017-03-25 MED ORDER — ACETAMINOPHEN 650 MG RE SUPP: 650.0000 mg | Freq: Four times a day (QID) | RECTAL | Status: DC | PRN

## 2017-03-25 MED ORDER — HYDRALAZINE HCL 50 MG PO TABS
25.0000 mg | ORAL_TABLET | Freq: Two times a day (BID) | ORAL | Status: DC
  Administered 2017-03-26 – 2017-03-27 (×3): 25 mg via ORAL
  Filled 2017-03-25 (×4): qty 1

## 2017-03-25 MED ORDER — POTASSIUM CHLORIDE CRYS ER 20 MEQ PO TBCR
40.0000 meq | EXTENDED_RELEASE_TABLET | Freq: Two times a day (BID) | ORAL | Status: DC
  Administered 2017-03-25: 40 meq via ORAL
  Filled 2017-03-25: qty 2

## 2017-03-25 MED ORDER — ALBUTEROL SULFATE (2.5 MG/3ML) 0.083% IN NEBU: 2.5000 mg | INHALATION_SOLUTION | Freq: Four times a day (QID) | RESPIRATORY_TRACT | Status: DC | PRN

## 2017-03-25 MED ORDER — MAGNESIUM CITRATE PO SOLN
1.0000 | Freq: Once | ORAL | Status: DC | PRN
  Filled 2017-03-25: qty 296

## 2017-03-25 MED ORDER — ONDANSETRON HCL 4 MG/2ML IJ SOLN: 4.0000 mg | Freq: Four times a day (QID) | INTRAMUSCULAR | Status: DC | PRN

## 2017-03-25 MED ORDER — VITAMIN B-12 500 MCG SL SUBL: 500.0000 ug | SUBLINGUAL_TABLET | Freq: Every day | SUBLINGUAL | Status: DC

## 2017-03-25 MED ORDER — ASPIRIN 81 MG PO CHEW
81.0000 mg | CHEWABLE_TABLET | Freq: Every day | ORAL | Status: DC
  Administered 2017-03-25 – 2017-03-27 (×3): 81 mg via ORAL
  Filled 2017-03-25 (×3): qty 1

## 2017-03-25 MED ORDER — POTASSIUM CHLORIDE 10 MEQ/100ML IV SOLN
10.0000 meq | INTRAVENOUS | Status: AC
  Administered 2017-03-25 (×3): 10 meq via INTRAVENOUS
  Filled 2017-03-25 (×3): qty 100

## 2017-03-25 MED ORDER — ADULT MULTIVITAMIN W/MINERALS CH
1.0000 | ORAL_TABLET | Freq: Every day | ORAL | Status: DC
  Administered 2017-03-25 – 2017-03-27 (×3): 1 via ORAL
  Filled 2017-03-25 (×3): qty 1

## 2017-03-25 MED ORDER — VITAMIN B-12 1000 MCG PO TABS
500.0000 ug | ORAL_TABLET | Freq: Every day | ORAL | Status: DC
  Administered 2017-03-25 – 2017-03-27 (×3): 500 ug via ORAL
  Filled 2017-03-25 (×3): qty 1

## 2017-03-25 MED ORDER — OXYCODONE HCL 5 MG PO TABS
5.0000 mg | ORAL_TABLET | ORAL | Status: DC | PRN
  Administered 2017-03-26 – 2017-03-27 (×2): 5 mg via ORAL
  Filled 2017-03-25 (×3): qty 1

## 2017-03-25 MED ORDER — MESALAMINE 1.2 G PO TBEC
4.8000 g | DELAYED_RELEASE_TABLET | Freq: Every day | ORAL | Status: DC
  Administered 2017-03-26 – 2017-03-27 (×2): 4.8 g via ORAL
  Filled 2017-03-25 (×4): qty 4

## 2017-03-25 MED ORDER — IPRATROPIUM BROMIDE 0.02 % IN SOLN
0.5000 mg | Freq: Four times a day (QID) | RESPIRATORY_TRACT | Status: DC | PRN
Start: 2017-03-25 — End: 2017-03-27

## 2017-03-25 MED ORDER — ENOXAPARIN SODIUM 40 MG/0.4ML ~~LOC~~ SOLN: 40.0000 mg | SUBCUTANEOUS | Status: DC

## 2017-03-25 MED ORDER — ONDANSETRON HCL 4 MG PO TABS
4.0000 mg | ORAL_TABLET | Freq: Four times a day (QID) | ORAL | Status: DC | PRN
  Filled 2017-03-25 (×2): qty 1

## 2017-03-25 MED ORDER — MAGNESIUM SULFATE 4 GM/100ML IV SOLN
4.0000 g | Freq: Once | INTRAVENOUS | Status: AC
  Administered 2017-03-25: 4 g via INTRAVENOUS
  Filled 2017-03-25: qty 100

## 2017-03-25 MED ORDER — LACTASE 3000 UNITS PO TABS
6000.0000 [IU] | ORAL_TABLET | Freq: Three times a day (TID) | ORAL | Status: DC
  Administered 2017-03-25 – 2017-03-27 (×7): 6000 [IU] via ORAL
  Filled 2017-03-25 (×9): qty 2

## 2017-03-25 MED ORDER — BISACODYL 5 MG PO TBEC: 5.0000 mg | DELAYED_RELEASE_TABLET | Freq: Every day | ORAL | Status: DC | PRN

## 2017-03-25 NOTE — Progress Notes (Signed)
PT Cancellation Note  Patient Details Name: Sabrina Hall MRN: [id number] DOB: Feb 05, 1923   Cancelled Treatment:    Reason Eval/Treat Not Completed: Medical issues which prohibited therapy (Potassium 2.7).  Will attempt to see pt again later today if pt medically appropriate, and schedule permitting.   Collie Siad PT, DPT 03/25/2017, 8:08 AM

## 2017-03-25 NOTE — Clinical Social Work Note (Signed)
Clinical Social Work Assessment  Patient Details  Name: Sabrina Hall MRN: [id number] Date of Birth: 1923-09-29  Date of referral:  03/25/17               Reason for consult:  Discharge Planning                Permission sought to share information with:  Case Manager, Facility Sport and exercise psychologist, Family Supports Permission granted to share information::  Yes, Verbal Permission Granted  Name::        Agency::  [facility]  Relationship::     Contact Information:     Housing/Transportation Living arrangements for the past 2 months:  Apartment, Chartered loss adjuster of Information:  Patient, Medical Team, Case Freight forwarder, Facility Patient Interpreter Needed:  None Criminal Activity/Legal Involvement Pertinent to Current Situation/Hospitalization:  No - Comment as needed Significant Relationships:  Adult Children, Other Family Members, Community Support, Friend Lives with:  Facility Resident Do you feel safe going back to the place where you live?  Yes (possibly will need SNF per ALF, waiting PT consult) Need for family participation in patient care:  Yes (Comment) (AMS)  Care giving concerns:  Patient admitted from [place]: [facility].  Patient AMS, thus was unable to give history, however her granddaughter was contacted and updated.  Granddaughter lives in [place] and reports her mother (patient's daughter) lives in the [place] with her patient's father for work.  Patient's daughter is available by phone/skype if needed during hospital stay.  Daughter has been contacted via hospital staff per report this AM on unit.  Patient has been doing well at ALF per facility who was contacted, but requesting a PT consult and possibly have patient move to SNF for ST in effort to regain strength before returning to apartment.  Granddaughter was aware of plan and understanding.   Social Worker assessment / plan:  Assessment completed. Awaiting PT note as  patient medically too acute to get accurate assessment reflecting needs.  SNF possibly and [facility] aware of situation as LCSW spoke with Seth Bake.  Will follow up and coordinate care pending out come of PT consult.  Employment status:  Retired Nurse, adult PT Recommendations:  Not assessed at this time (pending PT, currently medical hindering consult) Information / Referral to community resources:  [place]  Patient/Family's Response to care:  Understanding  Patient/Family's Understanding of and Emotional Response to Diagnosis, Current Treatment, and Prognosis:  Family understanding and appreciative of phone call as all members are out of town.    Emotional Assessment Appearance:  Appears stated age Attitude/Demeanor/Rapport:    Affect (typically observed):  Unable to Assess (confused) Orientation:  Oriented to Self Alcohol / Substance use:  Not Applicable Psych involvement (Current and /or in the community):  No (Comment)  Discharge Needs  Concerns to be addressed:  Care Coordination Readmission within the last 30 days:  No Current discharge risk:  None Barriers to Discharge:  Continued Medical Work up   [place], LCSW 03/25/2017, 2:32 PM

## 2017-03-25 NOTE — Consult Note (Signed)
MEDICATION RELATED CONSULT NOTE - INITIAL   Pharmacy Consult for electrolytes Indication: hypokalemia/hypomagnesium  Allergies  Allergen Reactions  . Lactose   . Maxzide [Hydrochlorothiazide W-Triamterene]   . Nsaids   . Penicillins   . Sulfonamide Derivatives     Patient Measurements: Height: 5' (152.4 cm) Weight: 128 lb 1.6 oz (58.1 kg) IBW/kg (Calculated) : 45.5   Vital Signs: Temp: 97.6 F (36.4 C) (06/27 1218) Temp Source: Oral (06/27 1218) BP: 121/42 (06/27 1218) Pulse Rate: 79 (06/27 1218) Intake/Output from previous day: 06/26 0701 - 06/27 0700 In: 422 [I.V.:222; IV Piggyback:200] Out: -  Intake/Output from this shift: No intake/output data recorded.  Labs:  Recent Labs  03/24/17 2156 03/24/17 2306 03/25/17 0601 03/25/17 1802  WBC 10.5  --  9.8  --   HGB 10.3*  --  11.4*  --   HCT 32.7*  --  32.4*  --   PLT 257  --  193  --   CREATININE 1.75* 1.63* 1.18*  --   MG  --  1.4* 1.4* 2.7*  PHOS  --   --  2.9  --   ALBUMIN 3.1*  --   --   --   PROT 6.3*  --   --   --   AST 19  --   --   --   ALT 17  --   --   --   ALKPHOS 63  --   --   --   BILITOT 0.8  --   --   --    Estimated Creatinine Clearance: 24.8 mL/min (A) (by C-G formula based on SCr of 1.18 mg/dL (H)).   Medications:  Scheduled:  . amLODipine  5 mg Oral BID  . aspirin  81 mg Oral Daily  . enoxaparin (LOVENOX) injection  30 mg Subcutaneous Q24H  . hydrALAZINE  25 mg Oral BID  . lactase  6,000 Units Oral TID WC  . losartan  100 mg Oral Daily  . mesalamine  4.8 g Oral Q breakfast  . multivitamin with minerals  1 tablet Oral Daily  . vitamin B-12  500 mcg Oral Daily    Assessment: Patient is a 83 year old female who presents with dehydration, hypokalemia, hypomagnesium due to vomiting and diarrhea. Pharmacy consulted to replace electrolytes   Goal of Therapy:  Normalization of electrolytes  Plan:  K = 3.4, Mg = 2.7 this evening after supplementation. No additional replacement  needed at this time, will recheck electrolytes with AM labs tomorrow.  Lenis Noon, Pharm.D, BCPS Clinical Pharmacist 03/25/2017,8:18 PM

## 2017-03-25 NOTE — H&P (Signed)
History and Physical   [facility] - [place] @ [facility] Admission History and Physical [employer], D.O.    Patient Name: Sabrina Hall MR#: [id number] Date of Birth: 1923/02/21 Date of Admission: 03/24/2017   Chief Complaint:  Chief Complaint  Patient presents with  . Diarrhea  . Vomiting  Please note the entire history is obtained from the patient's emergency department chart, emergency department provider and the patient's records Patient's personal history is limited by dementia and altered mental status.   HPI: Sabrina Hall is a 83 y.o. female with a known history of Crohn's, hypertension presents to the emergency department for evaluation of vomiting and diarrhea.  Patient was found to be lethargic and fatigued with nausea, vomiting and diarrhea for the last 2 days. In the emergency department she was found to have severe hyponatremia and hypokalemia presumably secondary to gastroenteritis. Medical admission was requested for electrolyte replacement and rehydration.  Patient denies fevers/chills, weakness, dizziness, chest pain, shortness of breath, N/V/C/D, abdominal pain, dysuria/frequency, changes in mental status.    Otherwise there has been no change in status. Patient has been taking medication as prescribed and there has been no recent change in medication or diet.  She denies recent antibiotics.  There has been no recent illness, hospitalizations, travel or sick contacts.    EMS/ED Course: Patient received Zofran, normal saline, potassium.  Review of Systems:  CONSTITUTIONAL: No fever/chills, fatigue, weakness, weight gain/loss, headache. EYES: No blurry or double vision. ENT: No tinnitus, postnasal drip, redness or soreness of the oropharynx. RESPIRATORY: No cough, dyspnea, wheeze.  No hemoptysis.  CARDIOVASCULAR: No chest pain, palpitations, syncope, orthopnea. No lower extremity edema.  GASTROINTESTINAL: Positive nausea, vomiting, diarrhea, negative  abdominal pain, constipation.  No hematemesis, melena or hematochezia. GENITOURINARY: No dysuria, frequency, hematuria. ENDOCRINE: No polyuria or nocturia. No heat or cold intolerance. HEMATOLOGY: No anemia, bruising, bleeding. INTEGUMENTARY: No rashes, ulcers, lesions. MUSCULOSKELETAL: No arthritis, gout, dyspnea. NEUROLOGIC: No numbness, tingling, ataxia, seizure-type activity, weakness. PSYCHIATRIC: No anxiety, depression, insomnia.   Past Medical History:  Diagnosis Date  . Arthritis   . Cataract   . Crohn's   . Deviated septum   . Eczema   . H/O seasonal allergies   . History of hemorrhoids   . History of shingles   . Hypertension   . Psoriasis   . Vitamin B12 deficiency     Past Surgical History:  Procedure Laterality Date  . basel cell carcinoma    . CATARACT EXTRACTION     right eye  . CHOLECYSTECTOMY    . HYSTERECTOMY    . ILEOCECOSTOMY    . MOHS SURGERY    . NASAL SEPTUM SURGERY    . NECK SURGERY  2004   mass removed from cervical spine  . NOSE SURGERY     basal cell carcinoma of nose     reports that she quit smoking about 35 years ago. Her smoking use included Cigarettes. She smoked 0.50 packs per day. She has never used smokeless tobacco. She reports that she drinks alcohol. She reports that she does not use drugs.  Allergies  Allergen Reactions  . Lactose   . Maxzide [Hydrochlorothiazide W-Triamterene]   . Nsaids   . Penicillins   . Sulfonamide Derivatives     Family History  Problem Relation Age of Onset  . Stroke Father   . Cancer Neg Hx   . Diabetes Neg Hx     Prior to Admission medications   Medication Sig Start Date End  Date Taking? Authorizing Provider  amLODipine (NORVASC) 5 MG tablet TAKE ONE TABLET BY MOUTH TWICE DAILY 08/27/15   Minna Merritts, MD  aspirin 81 MG tablet Take 81 mg by mouth daily.    [provider]  Cyanocobalamin (VITAMIN B-12) 500 MCG SUBL Place 500 mcg under the tongue daily.    [provider]  CycloSPORINE (RESTASIS OP) Apply to eye 2 (two) times daily as needed.     [provider]  diazepam (VALIUM) 5 MG tablet Takes 1/2  tablet bid prn    [provider]  fish oil-omega-3 fatty acids 1000 MG capsule Take 1 g by mouth 2 (two) times daily.     [provider]  hydrALAZINE (APRESOLINE) 25 MG tablet TAKE ONE TABLET BY MOUTH TWICE DAILY 08/27/15   Minna Merritts, MD  lactase (LACTAID) 3000 units tablet Take 6,000 Units by mouth 3 (three) times daily with meals.    [provider]  loperamide (IMODIUM) 2 MG capsule Take 2 mg by mouth 2 (two) times daily as needed.     [provider]  losartan (COZAAR) 100 MG tablet Take 100 mg by mouth daily.    [provider]  Melatonin 3 MG TABS Take 1 tablet by mouth at bedtime.    [provider]  mesalamine (LIALDA) 1.2 g EC tablet Take 4.8 g by mouth daily with breakfast.    [provider]  Multiple Vitamin (MULTIVITAMIN) tablet Take 1 tablet by mouth daily.      [provider]  Polyethyl Glycol-Propyl Glycol (SYSTANE OP) Apply to eye 4 (four) times daily.    [provider]    Physical Exam: Vitals:   03/24/17 2146 03/24/17 2153  BP:  (!) 120/50  Pulse:  73  Resp:  16  Temp:  97.7 F (36.5 C)  TempSrc:  Oral  SpO2:  93%  Weight: 58.1 kg (128 lb 1.6 oz)   Height: 5' (1.524 m)     GENERAL: 83 y.o.-year-old White female patient, well-developed, well-nourished lying in the bed in no acute distress.  Pleasant and cooperative.   HEENT: Head atraumatic, normocephalic. Pupils equal, round, reactive to light and accommodation. No scleral icterus. Extraocular muscles intact. Nares are patent. Oropharynx is clear. Mucus membranes dry. NECK: Supple, full range of motion. CHEST: Normal breath sounds bilaterally. No wheezing, rales, rhonchi or crackles. No use of accessory muscles of respiration.  No reproducible chest wall tenderness.   CARDIOVASCULAR: Regular. S1, S2 normal.  Cap refill <2 seconds. Pulses intact distally.  ABDOMEN: Soft, nondistended, nontender. No rebound, guarding, rigidity. Normoactive bowel sounds present in all four quadrants. No organomegaly or mass. EXTREMITIES: No pedal edema, cyanosis, or clubbing. No calf tenderness or Homan's sign.  NEUROLOGIC: The patient is alert and oriented x 2. Cranial nerves II through XII are grossly intact with no focal sensorimotor deficit. SKIN: Warm, dry, and intact without obvious rash, lesion, or ulcer.    Labs on Admission:  CBC:  Recent Labs Lab 03/24/17 2156  WBC 10.5  HGB 10.3*  HCT 32.7*  MCV 65.0*  PLT 597   Basic Metabolic Panel:  Recent Labs Lab 03/24/17 2156 03/24/17 2306  NA 121* 123*  K 2.0* 2.2*  CL 93* 96*  CO2 18* 18*  GLUCOSE 128* 117*  BUN 48* 46*  CREATININE 1.75* 1.63*  CALCIUM 7.8* 7.4*  MG  --  1.4*   GFR: Estimated Creatinine Clearance: 17.9 mL/min (A) (by C-G formula based on  SCr of 1.63 mg/dL (H)). Liver Function Tests:  Recent Labs Lab 03/24/17 2156  AST 19  ALT 17  ALKPHOS 63  BILITOT 0.8  PROT 6.3*  ALBUMIN 3.1*    Recent Labs Lab 03/24/17 2156  LIPASE 25   No results for input(s): AMMONIA in the last 168 hours. Coagulation Profile: No results for input(s): INR, PROTIME in the last 168 hours. Cardiac Enzymes:  Recent Labs Lab 03/24/17 2156  TROPONINI <0.03   BNP (last 3 results) No results for input(s): PROBNP in the last 8760 hours. HbA1C: No results for input(s): HGBA1C in the last 72 hours. CBG: No results for input(s): GLUCAP in the last 168 hours. Lipid Profile: No results for input(s): CHOL, HDL, LDLCALC, TRIG, CHOLHDL, LDLDIRECT in the last 72 hours. Thyroid Function Tests: No results for input(s): TSH, T4TOTAL, FREET4, T3FREE, THYROIDAB in the last 72 hours. Anemia Panel: No results for input(s): VITAMINB12, FOLATE, FERRITIN, TIBC, IRON, RETICCTPCT in the last 72 hours. Urine  analysis:    Component Value Date/Time   COLORURINE AMBER (A) 03/24/2017 2235   APPEARANCEUR CLOUDY (A) 03/24/2017 2235   APPEARANCEUR Hazy 10/07/2014 1323   LABSPEC 1.013 03/24/2017 2235   LABSPEC 1.011 10/07/2014 1323   PHURINE 5.0 03/24/2017 2235   GLUCOSEU NEGATIVE 03/24/2017 2235   GLUCOSEU 150 mg/dL 10/07/2014 1323   HGBUR NEGATIVE 03/24/2017 2235   BILIRUBINUR NEGATIVE 03/24/2017 2235   BILIRUBINUR Negative 10/07/2014 1323   KETONESUR NEGATIVE 03/24/2017 2235   PROTEINUR NEGATIVE 03/24/2017 2235   NITRITE NEGATIVE 03/24/2017 2235   LEUKOCYTESUR LARGE (A) 03/24/2017 2235   LEUKOCYTESUR 2+ 10/07/2014 1323   Sepsis Labs: @LABRCNTIP (procalcitonin:4,lacticidven:4) )No results found for this or any previous visit (from the past 240 hour(s)).   Radiological Exams on Admission: No results found.  EKG: Normal sinus rhythm at 83 bpm with normal axis, prolonged QTC and nonspecific ST-T wave changes.   Assessment/Plan  This is a 83 y.o. female with a history of Crohn's, hypertension now being admitted with:  #. Hyponatremia secondary to dehydration -Admit inpatient -IV fluid hydration -Recheck BMP in a.m.  #. Hypokalemia -Replace IV and by mouth -Recheck BMP in a.m.  #. Gastroenteritis -Anti-emetics as needed -Follow-up stool cultures including C. difficile  #. History of Crohn's - Continue mesalamine, Lactaid  #. History of hypertension - Continue Norvasc, hydralazine  Admission status: Inpatient IV Fluids: Normal saline Diet/Nutrition: Clear liquids, advance as tolerated Consults called: None  DVT Px: Lovenox, SCDs and early ambulation. Code Status: Full Code  Disposition Plan: To home in 1-2 days  All the records are reviewed and case discussed with ED provider. Management plans discussed with the patient and/or family who express understanding and agree with plan of care.  Lachlan Mckim D.O. on 03/25/2017 at 12:10 AM Between 7am to 6pm - Pager -  [phone] After 6pm go to [email or web address] - [employer] [facility] Office [phone] CC: Primary care physician; Venia Carbon, MD   03/25/2017, 12:10 AM

## 2017-03-25 NOTE — Progress Notes (Signed)
[place] [facility] [place] NAME: Sabrina Hall    MR#:  [id number]  DATE OF BIRTH:  1922-11-21  SUBJECTIVE:   Came in after having intractable nausea vomiting and diarrhea. Patient has history of Crohn's illness and recently meds were changed. She started experiencing diarrhea. Feels a little better. Diarrhea is slowing down. REVIEW OF SYSTEMS:   Review of Systems  Constitutional: Negative for chills, fever and weight loss.  HENT: Negative for ear discharge, ear pain and nosebleeds.   Eyes: Negative for blurred vision, pain and discharge.  Respiratory: Negative for sputum production, shortness of breath, wheezing and stridor.   Cardiovascular: Negative for chest pain, palpitations, orthopnea and PND.  Gastrointestinal: Positive for diarrhea and nausea. Negative for abdominal pain and vomiting.  Genitourinary: Negative for frequency and urgency.  Musculoskeletal: Negative for back pain and joint pain.  Neurological: Negative for sensory change, speech change, focal weakness and weakness.  Psychiatric/Behavioral: Negative for depression and hallucinations. The patient is not nervous/anxious.    Tolerating Diet:CLD Tolerating PT: pending  DRUG ALLERGIES:   Allergies  Allergen Reactions  . Lactose   . Maxzide [Hydrochlorothiazide W-Triamterene]   . Nsaids   . Penicillins   . Sulfonamide Derivatives     VITALS:  Blood pressure (!) 121/42, pulse 79, temperature 97.6 F (36.4 C), temperature source Oral, resp. rate 16, height 5' (1.524 m), weight 58.1 kg (128 lb 1.6 oz), SpO2 93 %.  PHYSICAL EXAMINATION:   Physical Exam  GENERAL:  83 y.o.-year-old patient lying in the bed with no acute distress.  EYES: Pupils equal, round, reactive to light and accommodation. No scleral icterus. Extraocular muscles intact.  HEENT: Head atraumatic, normocephalic. Oropharynx and nasopharynx clear. Dry oral mucosa NECK:  Supple, no jugular venous  distention. No thyroid enlargement, no tenderness.  LUNGS: Normal breath sounds bilaterally, no wheezing, rales, rhonchi. No use of accessory muscles of respiration.  CARDIOVASCULAR: S1, S2 normal. No murmurs, rubs, or gallops.  ABDOMEN: Soft, nontender, nondistended. Bowel sounds present. No organomegaly or mass.  EXTREMITIES: No cyanosis, clubbing or edema b/l.    NEUROLOGIC: Cranial nerves II through XII are intact. No focal Motor or sensory deficits b/l.   PSYCHIATRIC:  patient is alert and oriented x 3.  SKIN: No obvious rash, lesion, or ulcer.   LABORATORY PANEL:  CBC  Recent Labs Lab 03/25/17 0601  WBC 9.8  HGB 11.4*  HCT 32.4*  PLT 193    Chemistries   Recent Labs Lab 03/24/17 2156  03/25/17 0601  NA 121*  < > 126*  K 2.0*  < > 2.7*  CL 93*  < > 103  CO2 18*  < > 18*  GLUCOSE 128*  < > 105*  BUN 48*  < > 40*  CREATININE 1.75*  < > 1.18*  CALCIUM 7.8*  < > 7.4*  MG  --   < > 1.4*  AST 19  --   --   ALT 17  --   --   ALKPHOS 63  --   --   BILITOT 0.8  --   --   < > = values in this interval not displayed. Cardiac Enzymes  Recent Labs Lab 03/24/17 2156  TROPONINI <0.03   RADIOLOGY:  No results found. ASSESSMENT AND PLAN:  83 y.o. female with a history of Crohn's, hypertension now being admitted with:  #. Hyponatremia secondary to dehydration -IV fluid hydration -Recheck BMP in a.m.  #. Hypokalemia -Replace IV  and by mouth  #. Gastroenteritis -Anti-emetics as needed -stool studies negative  #. History of Crohn's - Continue mesalamine, Lactaid  #. History of hypertension - Continue Norvasc, hydralazine  PT to see pt  Case discussed with Care Management/Social Worker. Management plans discussed with the patient, family and they are in agreement.  CODE STATUS: DNR  DVT Prophylaxis: Lovenox  TOTAL TIME TAKING CARE OF THIS PATIENT: 25* minutes.  >50% time spent on counselling and coordination of care  POSSIBLE D/C IN *1-2 DAYS,  DEPENDING ON CLINICAL CONDITION.  Note: This dictation was prepared with Dragon dictation along with smaller phrase technology. Any transcriptional errors that result from this process are unintentional.  Jaquil Todt M.D on 03/25/2017 at 5:09 PM  Between 7am to 6pm - Pager - [phone]  After 6pm go to [email or web address] - password [email or web address]  Office  [phone]  CC: Primary care physician; Venia Carbon, MD

## 2017-03-25 NOTE — Consult Note (Signed)
MEDICATION RELATED CONSULT NOTE - INITIAL   Pharmacy Consult for electrolytes Indication: hypokalemia/hypomagnesium  Allergies  Allergen Reactions  . Lactose   . Maxzide [Hydrochlorothiazide W-Triamterene]   . Nsaids   . Penicillins   . Sulfonamide Derivatives     Patient Measurements: Height: 5' (152.4 cm) Weight: 128 lb 1.6 oz (58.1 kg) IBW/kg (Calculated) : 45.5 Adjusted Body Weight:   Vital Signs: Temp: 98.6 F (37 C) (06/27 0529) Temp Source: Oral (06/27 0529) BP: 128/45 (06/27 0529) Pulse Rate: 79 (06/27 0529) Intake/Output from previous day: 06/26 0701 - 06/27 0700 In: 422 [I.V.:222; IV Piggyback:200] Out: -  Intake/Output from this shift: Total I/O In: -  Out: 300 [Urine:300]  Labs:  Recent Labs  03/24/17 2156 03/24/17 2306 03/25/17 0601  WBC 10.5  --  9.8  HGB 10.3*  --  11.4*  HCT 32.7*  --  32.4*  PLT 257  --  193  CREATININE 1.75* 1.63* 1.18*  MG  --  1.4* 1.4*  PHOS  --   --  2.9  ALBUMIN 3.1*  --   --   PROT 6.3*  --   --   AST 19  --   --   ALT 17  --   --   ALKPHOS 63  --   --   BILITOT 0.8  --   --    Estimated Creatinine Clearance: 24.8 mL/min (A) (by C-G formula based on SCr of 1.18 mg/dL (H)).   Microbiology: Recent Results (from the past 720 hour(s))  C difficile quick scan w PCR reflex     Status: None   Collection Time: 03/25/17  3:50 AM  Result Value Ref Range Status   C Diff antigen NEGATIVE NEGATIVE Final   C Diff toxin NEGATIVE NEGATIVE Final   C Diff interpretation No C. difficile detected.  Final  Gastrointestinal Panel by PCR , Stool     Status: None   Collection Time: 03/25/17  3:50 AM  Result Value Ref Range Status   Campylobacter species NOT DETECTED NOT DETECTED Final   Plesimonas shigelloides NOT DETECTED NOT DETECTED Final   Salmonella species NOT DETECTED NOT DETECTED Final   Yersinia enterocolitica NOT DETECTED NOT DETECTED Final   Vibrio species NOT DETECTED NOT DETECTED Final   Vibrio cholerae NOT  DETECTED NOT DETECTED Final   Enteroaggregative E coli (EAEC) NOT DETECTED NOT DETECTED Final   Enteropathogenic E coli (EPEC) NOT DETECTED NOT DETECTED Final   Enterotoxigenic E coli (ETEC) NOT DETECTED NOT DETECTED Final   Shiga like toxin producing E coli (STEC) NOT DETECTED NOT DETECTED Final   Shigella/Enteroinvasive E coli (EIEC) NOT DETECTED NOT DETECTED Final   Cryptosporidium NOT DETECTED NOT DETECTED Final   Cyclospora cayetanensis NOT DETECTED NOT DETECTED Final   Entamoeba histolytica NOT DETECTED NOT DETECTED Final   Giardia lamblia NOT DETECTED NOT DETECTED Final   Adenovirus F40/41 NOT DETECTED NOT DETECTED Final   Astrovirus NOT DETECTED NOT DETECTED Final   Norovirus GI/GII NOT DETECTED NOT DETECTED Final   Rotavirus A NOT DETECTED NOT DETECTED Final   Sapovirus (I, II, IV, and V) NOT DETECTED NOT DETECTED Final    Medical History: Past Medical History:  Diagnosis Date  . Arthritis   . Cataract   . Crohn's   . Deviated septum   . Eczema   . H/O seasonal allergies   . History of hemorrhoids   . History of shingles   . Hypertension   . Psoriasis   .  Vitamin B12 deficiency     Medications:  Scheduled:  . amLODipine  5 mg Oral BID  . aspirin  81 mg Oral Daily  . enoxaparin (LOVENOX) injection  30 mg Subcutaneous Q24H  . hydrALAZINE  25 mg Oral BID  . lactase  6,000 Units Oral TID WC  . losartan  100 mg Oral Daily  . mesalamine  4.8 g Oral Q breakfast  . multivitamin with minerals  1 tablet Oral Daily  . vitamin B-12  500 mcg Oral Daily    Assessment: Patient is a 83 year old female who presents with dehydration, hypokalemia, hypomagnesium due to vomiting and diarrhea. Pharmacy consulted to replace electrolytes Mg=1.4 K=2.7 Phos=2.9  Goal of Therapy:  Normalization of electrolytes  Plan:  Mg IV 4g once KCL 10MEQ IV x 4 KCL po 40 MEQ once Recheck electrolytes at [place], Pharm.D, BCPS Clinical Pharmacist  03/25/2017,10:44  AM

## 2017-03-26 LAB — BASIC METABOLIC PANEL
ANION GAP: 4 — AB (ref 5–15)
BUN: 21 mg/dL — ABNORMAL HIGH (ref 6–20)
CO2: 18 mmol/L — AB (ref 22–32)
Calcium: 7.9 mg/dL — ABNORMAL LOW (ref 8.9–10.3)
Chloride: 110 mmol/L (ref 101–111)
Creatinine, Ser: 0.83 mg/dL (ref 0.44–1.00)
GFR calc Af Amer: >60 mL/min (ref >60)
GFR, EST NON AFRICAN AMERICAN: 60 mL/min — AB (ref >60)
GLUCOSE: 94 mg/dL (ref 65–99)
POTASSIUM: 2.9 mmol/L — AB (ref 3.5–5.1)
Sodium: 132 mmol/L — ABNORMAL LOW (ref 135–145)

## 2017-03-26 LAB — MAGNESIUM: Magnesium: 2.3 mg/dL (ref 1.7–2.4)

## 2017-03-26 LAB — POTASSIUM: POTASSIUM: 3.5 mmol/L (ref 3.5–5.1)

## 2017-03-26 MED ORDER — POTASSIUM CHLORIDE CRYS ER 20 MEQ PO TBCR
40.0000 meq | EXTENDED_RELEASE_TABLET | Freq: Once | ORAL | Status: AC
  Administered 2017-03-26: 40 meq via ORAL
  Filled 2017-03-26: qty 2

## 2017-03-26 MED ORDER — ENOXAPARIN SODIUM 40 MG/0.4ML ~~LOC~~ SOLN
40.0000 mg | SUBCUTANEOUS | Status: DC
  Administered 2017-03-26: 40 mg via SUBCUTANEOUS
  Filled 2017-03-26: qty 0.4

## 2017-03-26 MED ORDER — POTASSIUM CHLORIDE IN NACL 40-0.9 MEQ/L-% IV SOLN
INTRAVENOUS | Status: DC
  Administered 2017-03-26: 100 mL/h via INTRAVENOUS
  Filled 2017-03-26 (×4): qty 1000

## 2017-03-26 NOTE — Consult Note (Signed)
MEDICATION RELATED CONSULT NOTE - INITIAL   Pharmacy Consult for electrolytes Indication: hypokalemia/hypomagnesium  Allergies  Allergen Reactions  . Lactose   . Maxzide [Hydrochlorothiazide W-Triamterene]   . Nsaids   . Penicillins   . Sulfonamide Derivatives     Patient Measurements: Height: 5' (152.4 cm) Weight: 128 lb 1.6 oz (58.1 kg) IBW/kg (Calculated) : 45.5   Vital Signs: Temp: 97.7 F (36.5 C) (06/28 1223) Temp Source: Axillary (06/28 1223) BP: 139/48 (06/28 1223) Pulse Rate: 73 (06/28 1223) Intake/Output from previous day: 06/27 0701 - 06/28 0700 In: 2256 [P.O.:360; I.V.:1796; IV Piggyback:100] Out: 1960 [Urine:1960] Intake/Output from this shift: No intake/output data recorded.  Labs:  Recent Labs  03/24/17 2156  03/24/17 2306 03/25/17 0601 03/25/17 1802 03/26/17 0516  WBC 10.5  --   --  9.8  --   --   HGB 10.3*  --   --  11.4*  --   --   HCT 32.7*  --   --  32.4*  --   --   PLT 257  --   --  193  --   --   CREATININE 1.75*  --  1.63* 1.18*  --  0.83  MG  --   < > 1.4* 1.4* 2.7* 2.3  PHOS  --   --   --  2.9  --   --   ALBUMIN 3.1*  --   --   --   --   --   PROT 6.3*  --   --   --   --   --   AST 19  --   --   --   --   --   ALT 17  --   --   --   --   --   ALKPHOS 63  --   --   --   --   --   BILITOT 0.8  --   --   --   --   --   < > = values in this interval not displayed. Estimated Creatinine Clearance: 35.2 mL/min (by C-G formula based on SCr of 0.83 mg/dL).   Medications:  Scheduled:  . amLODipine  5 mg Oral BID  . aspirin  81 mg Oral Daily  . enoxaparin (LOVENOX) injection  40 mg Subcutaneous Q24H  . hydrALAZINE  25 mg Oral BID  . lactase  6,000 Units Oral TID WC  . losartan  100 mg Oral Daily  . mesalamine  4.8 g Oral Q breakfast  . multivitamin with minerals  1 tablet Oral Daily  . vitamin B-12  500 mcg Oral Daily    Assessment: Patient is an 83 year old female who presents with dehydration, hypokalemia, hypomagnesium due to  vomiting and diarrhea. Pharmacy consulted to replace electrolytes  Mg=1.4>2.7>2.3 K=2.0>2.2>2.7>6.4>2.9>>> 3.5   Goal of Therapy:  Normalization of electrolytes  Plan:  K is now 3.5. Will recheck electrolytes with am labs.    Jonella Redditt D, Pharm.D Clinical Pharmacist 03/26/2017,7:34 PM

## 2017-03-26 NOTE — Consult Note (Signed)
MEDICATION RELATED CONSULT NOTE - INITIAL   Pharmacy Consult for electrolytes Indication: hypokalemia/hypomagnesium  Allergies  Allergen Reactions  . Lactose   . Maxzide [Hydrochlorothiazide W-Triamterene]   . Nsaids   . Penicillins   . Sulfonamide Derivatives     Patient Measurements: Height: 5' (152.4 cm) Weight: 128 lb 1.6 oz (58.1 kg) IBW/kg (Calculated) : 45.5   Vital Signs: Temp: 99 F (37.2 C) (06/28 0533) Temp Source: Oral (06/28 0533) BP: 124/40 (06/28 0533) Pulse Rate: 82 (06/28 0533) Intake/Output from previous day: 06/27 0701 - 06/28 0700 In: 2256 [P.O.:360; I.V.:1796; IV Piggyback:100] Out: 1960 [Urine:1960] Intake/Output from this shift: No intake/output data recorded.  Labs:  Recent Labs  03/24/17 2156  03/24/17 2306 03/25/17 0601 03/25/17 1802 03/26/17 0516  WBC 10.5  --   --  9.8  --   --   HGB 10.3*  --   --  11.4*  --   --   HCT 32.7*  --   --  32.4*  --   --   PLT 257  --   --  193  --   --   CREATININE 1.75*  --  1.63* 1.18*  --  0.83  MG  --   < > 1.4* 1.4* 2.7* 2.3  PHOS  --   --   --  2.9  --   --   ALBUMIN 3.1*  --   --   --   --   --   PROT 6.3*  --   --   --   --   --   AST 19  --   --   --   --   --   ALT 17  --   --   --   --   --   ALKPHOS 63  --   --   --   --   --   BILITOT 0.8  --   --   --   --   --   < > = values in this interval not displayed. Estimated Creatinine Clearance: 35.2 mL/min (by C-G formula based on SCr of 0.83 mg/dL).   Medications:  Scheduled:  . amLODipine  5 mg Oral BID  . aspirin  81 mg Oral Daily  . enoxaparin (LOVENOX) injection  30 mg Subcutaneous Q24H  . hydrALAZINE  25 mg Oral BID  . lactase  6,000 Units Oral TID WC  . losartan  100 mg Oral Daily  . mesalamine  4.8 g Oral Q breakfast  . multivitamin with minerals  1 tablet Oral Daily  . potassium chloride  40 mEq Oral Once  . vitamin B-12  500 mcg Oral Daily    Assessment: Patient is a 83 year old female who presents with dehydration,  hypokalemia, hypomagnesium due to vomiting and diarrhea. Pharmacy consulted to replace electrolytes  Mg=1.4>2.7>2.3 K=2.0>2.2>2.7>6.4>2.9  Goal of Therapy:  Normalization of electrolytes  Plan:  K low at 2.9. Patient still having diarrhea. Will add KCL to maintenance fluids. NS w/ 40 MEQ KCL @ 135m/min (4MEQ/hr) and will also give 40 MEQ po once. Recheck K at 1800.  MRamond Dial Pharm.D, BCPS Clinical Pharmacist 03/26/2017,7:40 AM

## 2017-03-26 NOTE — Progress Notes (Signed)
Patient with orders for lovenox 68m q 24hr. Pt with crcl >317mmin and weight >45kg. Per protocol will increase to 4069maily.  MelRamond Dialharm.D, BCPS Clinical Pharmacist

## 2017-03-26 NOTE — Evaluation (Signed)
Physical Therapy Evaluation Patient Details Name: Sabrina Hall MRN: [id number] DOB: [birth year] Today's Date: 03/26/2017   History of Present Illness  Pt is a pleasant 83y/o female admitted with dehydration with hyponatremia and hypkalemia. Pt with hx of Chrohn's and HTN.  Clinical Impression  Pt reported she was IND with all ADLs and ambulated with a rollator, prior to hospitalization. Pt reports one fall in the last six months. Pt was currently receiving PT at [facility] facility,as she lives at [facility] facililty. Pt required min guard to S during bed mobility, txfs, and amb. With RW to ensure safety. Pt required seated rest break upon sitting upright 2/2 lightheadedness, which subsided after 2 minutes. Please see below for impairments found during exam. PT recommending SNF at this time to improve safety during functional mobility. Pt would benefit from skilled PT to address above deficits and promote optimal return to PLOF     Follow Up Recommendations SNF [facility] pt currently lives at [facility] ALF)    Equipment Recommendations  None recommended by PT    Recommendations for Other Services       Precautions / Restrictions Precautions Precautions: Fall Restrictions Weight Bearing Restrictions: No      Mobility  Bed Mobility Overal bed mobility: Needs Assistance Bed Mobility: Supine to Sit;Sit to Supine     Supine to sit: HOB elevated;Supervision;Min guard Sit to supine: Min guard;Supervision;HOB elevated   General bed mobility comments: Min guard to S with set up to ensure safety. Pt required incr. time 2/2 weakness and fatigue.   Transfers Overall transfer level: Needs assistance Equipment used: Rolling walker (2 wheeled) Transfers: Sit to/from Stand Sit to Stand: Min guard         General transfer comment: Cues for hand placement and technique.  Ambulation/Gait Ambulation/Gait assistance: Min guard Ambulation Distance (Feet):  120 Feet Assistive device: Rolling walker (2 wheeled) Gait Pattern/deviations: Step-through pattern;Decreased stride length;Trunk flexed [facility])     General Gait Details: Cues to improve heel strike and upright posture. Cues for sequencing with RW vs. rollator (especially during turns).  Stairs            Wheelchair Mobility    Modified Rankin (Stroke Patients Only)       Balance Overall balance assessment: Needs assistance Sitting-balance support: No upper extremity supported;Feet supported Sitting balance-Leahy Scale: Good     Standing balance support: Bilateral upper extremity supported Standing balance-Leahy Scale: Fair Standing balance comment: Pt maintained standing balance with BUE support on RW                             Pertinent Vitals/Pain Pain Assessment: No/denies pain    Home Living Family/patient expects to be discharged to:: Assisted living [facility])               Home Equipment: Environmental consultant - 4 wheels;Cane - single point;Shower seat Additional Comments: Pt was able to amb. to/from dining hall and took the elevator from her apartment.    Prior Function Level of Independence: Independent with assistive device(s)               Hand Dominance        Extremity/Trunk Assessment   Upper Extremity Assessment Upper Extremity Assessment: Generalized weakness    Lower Extremity Assessment Lower Extremity Assessment: Generalized weakness (pt denied N/T in B UE/LE)       Communication   Communication: No difficulties  Cognition Arousal/Alertness: Awake/alert Behavior During Therapy: WFL for tasks assessed/performed Overall Cognitive Status: Within Functional Limits for tasks assessed                                        General Comments      Exercises     Assessment/Plan    PT Assessment Patient needs continued PT services  PT Problem List Decreased strength;Decreased activity tolerance;Decreased  balance;Decreased mobility;Decreased knowledge of use of DME       PT Treatment Interventions DME instruction;Gait training;Functional mobility training;Therapeutic activities;Therapeutic exercise;Balance training;Neuromuscular re-education;Patient/family education    PT Goals (Current goals can be found in the Care Plan section)  Acute Rehab PT Goals Patient Stated Goal: To get stronger PT Goal Formulation: With patient Time For Goal Achievement: 04/09/17 Potential to Achieve Goals: Good    Frequency Min 2X/week   Barriers to discharge        Co-evaluation               AM-PAC PT "6 Clicks" Daily Activity  Outcome Measure Difficulty turning over in bed (including adjusting bedclothes, sheets and blankets)?: None Difficulty moving from lying on back to sitting on the side of the bed? : A Little Difficulty sitting down on and standing up from a chair with arms (e.g., wheelchair, bedside commode, etc,.)?: A Little Help needed moving to and from a bed to chair (including a wheelchair)?: A Little Help needed walking in hospital room?: A Little Help needed climbing 3-5 steps with a railing? : Total 6 Click Score: 17    End of Session Equipment Utilized During Treatment: Gait belt Activity Tolerance: Patient tolerated treatment well Patient left: in bed;with call bell/phone within reach;with bed alarm set Nurse Communication: Mobility status;Other (comment) (d/c recommendations) PT Visit Diagnosis: Other abnormalities of gait and mobility (R26.89);Muscle weakness (generalized) (M62.81)    Time: 2574-9355 PT Time Calculation (min) (ACUTE ONLY): 27 min   Charges:   PT Evaluation $PT Eval Low Complexity: 1 Procedure PT Treatments $Gait Training: 8-22 mins   PT G CodesGeoffry Paradise, PT,DPT 03/26/17 3:45 PM  Jaye Saal L 03/26/2017, 3:42 PM

## 2017-03-26 NOTE — Progress Notes (Signed)
[place] at [place] NAME: Sabrina Hall    MR#:  [id number]  DATE OF BIRTH:  [birth year]  SUBJECTIVE:   Came in after having intractable nausea vomiting and diarrhea. Patient has history of Crohn's illness and recently meds were changed. She started experiencing diarrhea. Feels a little better. Diarrhea is slowing down. REVIEW OF SYSTEMS:   Review of Systems  Constitutional: Negative for chills, fever and weight loss.  HENT: Negative for ear discharge, ear pain and nosebleeds.   Eyes: Negative for blurred vision, pain and discharge.  Respiratory: Negative for sputum production, shortness of breath, wheezing and stridor.   Cardiovascular: Negative for chest pain, palpitations, orthopnea and PND.  Gastrointestinal: Positive for diarrhea and nausea. Negative for abdominal pain and vomiting.  Genitourinary: Negative for frequency and urgency.  Musculoskeletal: Negative for back pain and joint pain.  Neurological: Negative for sensory change, speech change, focal weakness and weakness.  Psychiatric/Behavioral: Negative for depression and hallucinations. The patient is not nervous/anxious.    Tolerating Diet:CLD Tolerating PT: pending  DRUG ALLERGIES:   Allergies  Allergen Reactions  . Lactose   . Maxzide [Hydrochlorothiazide W-Triamterene]   . Nsaids   . Penicillins   . Sulfonamide Derivatives     VITALS:  Blood pressure (!) 139/48, pulse 73, temperature 97.7 F (36.5 C), temperature source Axillary, resp. rate 12, height 5' (1.524 m), weight 58.1 kg (128 lb 1.6 oz), SpO2 97 %.  PHYSICAL EXAMINATION:   Physical Exam  GENERAL:  83 y.o.-year-old patient lying in the bed with no acute distress.  EYES: Pupils equal, round, reactive to light and accommodation. No scleral icterus. Extraocular muscles intact.  HEENT: Head atraumatic, normocephalic. Oropharynx and nasopharynx clear. Dry oral mucosa NECK:  Supple, no jugular  venous distention. No thyroid enlargement, no tenderness.  LUNGS: Normal breath sounds bilaterally, no wheezing, rales, rhonchi. No use of accessory muscles of respiration.  CARDIOVASCULAR: S1, S2 normal. No murmurs, rubs, or gallops.  ABDOMEN: Soft, nontender, nondistended. Bowel sounds present. No organomegaly or mass.  EXTREMITIES: No cyanosis, clubbing or edema b/l.    NEUROLOGIC: Cranial nerves II through XII are intact. No focal Motor or sensory deficits b/l.   PSYCHIATRIC:  patient is alert and oriented x 3.  SKIN: No obvious rash, lesion, or ulcer.   LABORATORY PANEL:  CBC  Recent Labs Lab 03/25/17 0601  WBC 9.8  HGB 11.4*  HCT 32.4*  PLT 193    Chemistries   Recent Labs Lab 03/24/17 2156  03/26/17 0516  NA 121*  < > 132*  K 2.0*  < > 2.9*  CL 93*  < > 110  CO2 18*  < > 18*  GLUCOSE 128*  < > 94  BUN 48*  < > 21*  CREATININE 1.75*  < > 0.83  CALCIUM 7.8*  < > 7.9*  MG  --   < > 2.3  AST 19  --   --   ALT 17  --   --   ALKPHOS 63  --   --   BILITOT 0.8  --   --   < > = values in this interval not displayed. Cardiac Enzymes  Recent Labs Lab 03/24/17 2156  TROPONINI <0.03   RADIOLOGY:  No results found. ASSESSMENT AND PLAN:  83 y.o. female with a history of Crohn's, hypertension now being admitted with:  #. Hyponatremia secondary to dehydration -IV fluid hydration -121---123---126---132  #. Hypokalemia -Replace IV and by mouth -  2.2--2.7--2.9  #. Gastroenteritis -Anti-emetics as needed -stool studies negative  #. History of Crohn's - Continue mesalamine, Lactaid  #. History of hypertension - Continue Norvasc, hydralazine  PT to see pt  Case discussed with Care Management/Social Worker. Management plans discussed with the patient, family and they are in agreement.  CODE STATUS: DNR  DVT Prophylaxis: Lovenox  TOTAL TIME TAKING CARE OF THIS PATIENT: 25* minutes.  >50% time spent on counselling and coordination of care  POSSIBLE  D/C IN *1-2 DAYS, DEPENDING ON CLINICAL CONDITION.  Note: This dictation was prepared with Dragon dictation along with smaller phrase technology. Any transcriptional errors that result from this process are unintentional.  Jazilyn Siegenthaler M.D on 03/26/2017 at 2:51 PM  Between 7am to 6pm - Pager - [phone]  After 6pm go to [email or web address] - password [email or web address] [facility]  Office  [phone]  CC: Primary care physician; Venia Carbon, MD

## 2017-03-27 LAB — MAGNESIUM: Magnesium: 1.5 mg/dL — ABNORMAL LOW (ref 1.7–2.4)

## 2017-03-27 LAB — BASIC METABOLIC PANEL
ANION GAP: 3 — AB (ref 5–15)
BUN: 10 mg/dL (ref 6–20)
CALCIUM: 8.2 mg/dL — AB (ref 8.9–10.3)
CO2: 19 mmol/L — AB (ref 22–32)
Chloride: 110 mmol/L (ref 101–111)
Creatinine, Ser: 0.56 mg/dL (ref 0.44–1.00)
Glucose, Bld: 102 mg/dL — ABNORMAL HIGH (ref 65–99)
POTASSIUM: 3.3 mmol/L — AB (ref 3.5–5.1)
Sodium: 132 mmol/L — ABNORMAL LOW (ref 135–145)

## 2017-03-27 MED ORDER — POTASSIUM CHLORIDE CRYS ER 20 MEQ PO TBCR
40.0000 meq | EXTENDED_RELEASE_TABLET | Freq: Once | ORAL | Status: AC
  Administered 2017-03-27: 40 meq via ORAL
  Filled 2017-03-27: qty 2

## 2017-03-27 MED ORDER — MAGNESIUM SULFATE 2 GM/50ML IV SOLN
2.0000 g | Freq: Once | INTRAVENOUS | Status: AC
  Administered 2017-03-27: 2 g via INTRAVENOUS
  Filled 2017-03-27: qty 50

## 2017-03-27 MED ORDER — POTASSIUM CHLORIDE CRYS ER 20 MEQ PO TBCR
20.0000 meq | EXTENDED_RELEASE_TABLET | Freq: Every day | ORAL | Status: DC
  Administered 2017-03-27: 20 meq via ORAL
  Filled 2017-03-27: qty 1

## 2017-03-27 NOTE — Clinical Social Work Placement (Signed)
   CLINICAL SOCIAL WORK PLACEMENT  NOTE  Date:  03/27/2017  Patient Details  Name: Sabrina Hall MRN: [id number] Date of Birth: 29-Apr-1923  Clinical Social Work is seeking post-discharge placement for this patient at the [place] level of care (*CSW will initial, date and re-position this form in  chart as items are completed):  Yes   Patient/family provided with [place] Work Department's list of facilities offering this level of care within the geographic area requested by the patient (or if unable, by the patient's family).  Yes   Patient/family informed of their freedom to choose among providers that offer the needed level of care, that participate in Medicare, Medicaid or managed care program needed by the patient, have an available bed and are willing to accept the patient.  Yes   Patient/family informed of [place]'s ownership interest in [facility] and [facility] as well as of the fact that they are under no obligation to receive care at these facilities.  PASRR submitted to EDS on 03/27/17     PASRR number received on 03/27/17     Existing PASRR number confirmed on       FL2 transmitted to all facilities in geographic area requested by pt/family on 03/27/17     FL2 transmitted to all facilities within larger geographic area on       Patient informed that his/her managed care company has contracts with or will negotiate with certain facilities, including the following:        Yes   Patient/family informed of bed offers received.  Patient chooses bed at  [facility])     Physician recommends and patient chooses bed at  [facility])    Patient to be transferred to  [facility]) on 03/27/17.  Patient to be transferred to facility by  [facility] transportation)     Patient family notified on 03/27/17 of transfer.  Name of family member notified:   (daughter and granddaughter)     PHYSICIAN       Additional Comment:     _______________________________________________ Shela Leff, LCSW 03/27/2017, 1:48 PM

## 2017-03-27 NOTE — Progress Notes (Signed)
Pt to be discharged per MD order. IV removed. Instructions printed and placed in packet. Pt placed in travel gown. Report called to [facility]. [facility] will provide their own transport.

## 2017-03-27 NOTE — Clinical Social Work Note (Signed)
Patient discharging today to go to [place] at [facility]. Seth Bake at [facility] is aware and discharge information sent. CSW attempted to reach patient's daughter who is in [place]: Sabrina Hall: [phone] but had to leave a message. CSW contacted Sabrina Hall, patient's granddaughter and informed her of patient's discharge and transfer to Healthcare. Sabrina Hall stated that she would attempt to reach her mother as well but stated she was in agreement with the transfer today. Shela Leff MSW,LCSW [phone]

## 2017-03-27 NOTE — Care Management Important Message (Signed)
Important Message  Patient Details  Name: Sabrina Hall MRN: [id number] Date of Birth: 09/18/1923   Medicare Important Message Given:  Yes    Danique Sessions, RN 03/27/2017, 12:20 PM

## 2017-03-27 NOTE — Progress Notes (Signed)
Called and left message for Dter. Dorian Pod [phone]. Daughter lives in [place].

## 2017-03-27 NOTE — Consult Note (Signed)
MEDICATION RELATED CONSULT NOTE - INITIAL   Pharmacy Consult for electrolytes Indication: hypokalemia/hypomagnesium  Allergies  Allergen Reactions  . Lactose   . Maxzide [Hydrochlorothiazide W-Triamterene]   . Nsaids   . Penicillins   . Sulfonamide Derivatives     Patient Measurements: Height: 5' (152.4 cm) Weight: 128 lb 1.6 oz (58.1 kg) IBW/kg (Calculated) : 45.5   Vital Signs: Temp: 98 F (36.7 C) (06/29 0340) Temp Source: Oral (06/29 0340) BP: 120/53 (06/29 0340) Pulse Rate: 98 (06/29 0340) Intake/Output from previous day: 06/28 0701 - 06/29 0700 In: 666.3 [P.O.:120; I.V.:546.3] Out: 1900 [Urine:1900] Intake/Output from this shift: No intake/output data recorded.  Labs:  Recent Labs  03/24/17 2156  03/25/17 0601 03/25/17 1802 03/26/17 0516 03/27/17 0338  WBC 10.5  --  9.8  --   --   --   HGB 10.3*  --  11.4*  --   --   --   HCT 32.7*  --  32.4*  --   --   --   PLT 257  --  193  --   --   --   CREATININE 1.75*  < > 1.18*  --  0.83 0.56  MG  --   < > 1.4* 2.7* 2.3 1.5*  PHOS  --   --  2.9  --   --   --   ALBUMIN 3.1*  --   --   --   --   --   PROT 6.3*  --   --   --   --   --   AST 19  --   --   --   --   --   ALT 17  --   --   --   --   --   ALKPHOS 63  --   --   --   --   --   BILITOT 0.8  --   --   --   --   --   < > = values in this interval not displayed. Estimated Creatinine Clearance: 36.5 mL/min (by C-G formula based on SCr of 0.56 mg/dL).   Medications:  Scheduled:  . amLODipine  5 mg Oral BID  . aspirin  81 mg Oral Daily  . enoxaparin (LOVENOX) injection  40 mg Subcutaneous Q24H  . hydrALAZINE  25 mg Oral BID  . lactase  6,000 Units Oral TID WC  . losartan  100 mg Oral Daily  . mesalamine  4.8 g Oral Q breakfast  . multivitamin with minerals  1 tablet Oral Daily  . potassium chloride  20 mEq Oral Daily  . potassium chloride  40 mEq Oral Once  . vitamin B-12  500 mcg Oral Daily    Assessment: Patient is a 83 year old female who  presents with dehydration, hypokalemia, hypomagnesium due to vomiting and diarrhea. Pharmacy consulted to replace electrolytes  Mg=1.4>2.7>2.3>>1.5 K=2.0>2.2>2.7>6.4>2.9>>> 3.5>>3.2  Goal of Therapy:  Normalization of electrolytes  Plan:  Patient continues to have diarrhea and K and Mg continue to be depleated. Maintenance fluids with KCL 40 MEQ still running, now at 68m/hr (3 MEQ/hr). Continue these fluids plus I will give 40 MEQ once and then start 20 MEQ daily until diarrhea is resolved.  Mg IV 2g once Recheck electrolytes at 1800  Lurlie Wigen D Maor Meckel, Pharm.D Clinical Pharmacist 03/27/2017,7:09 AM

## 2017-03-27 NOTE — Clinical Social Work Note (Signed)
CSW received call from patient's daughter and she expressed concerns that she did not want to spend the money on STR if she did not need it and that she wanted to speak with the MD. CSW coordinated for Dr. Posey Pronto to speak with her. Dr. Posey Pronto just informed CSW that she was able to speak with patient's daughter in [place]. Seth Bake at [facility] informed CSW that she has spoken with daughter and she is in agreement for STR. [facility] to transport. Shela Leff MSW,LCSW [phone]

## 2017-03-27 NOTE — Discharge Summary (Addendum)
[place] [facility] [place] NAME: Sabrina Hall    MR#:  [id number]  DATE OF BIRTH:  1922/10/31  DATE OF ADMISSION:  03/24/2017 ADMITTING PHYSICIAN: Harvie Bridge, DO  DATE OF DISCHARGE: 03/27/2017  PRIMARY CARE PHYSICIAN: Venia Carbon, MD    ADMISSION DIAGNOSIS:  Hypokalemia [E87.6] Hyponatremia [E87.1] Nausea vomiting and diarrhea [R11.2, R19.7] Diarrhea, unspecified type [R19.7]  DISCHARGE DIAGNOSIS:  Acute hyponatremia and hypokalemia with dehydration due to Diarhea--improved H/o Crohnz gen weakness  SECONDARY DIAGNOSIS:   Past Medical History:  Diagnosis Date  . Arthritis   . Cataract   . Crohn's   . Deviated septum   . Eczema   . H/O seasonal allergies   . History of hemorrhoids   . History of shingles   . Hypertension   . Psoriasis   . Vitamin B12 deficiency     HOSPITAL COURSE:  83 y.o.femalewith a history of Crohn's, hypertensionnow being admitted with:  #. Hyponatremia secondary to dehydration -IV fluid hydration -121---123---126---132--132  #. Hypokalemia -Replace IV and by mouth -2.2--2.7--2.9--3.3  #. Gastroenteritis -Anti-emetics as needed -stool studies negative  #. History of Crohn's - Continue mesalamine, sulfasalzine and Lactaid  #. History of hypertension - Continue Norvasc, losartan  PT recommends rehab To [facility] today granddter was updated y'day on the phone  CONSULTS OBTAINED:    DRUG ALLERGIES:   Allergies  Allergen Reactions  . Lactose   . Maxzide [Hydrochlorothiazide W-Triamterene]   . Nsaids   . Penicillins   . Sulfonamide Derivatives     DISCHARGE MEDICATIONS:   Current Discharge Medication List    CONTINUE these medications which have NOT CHANGED   Details  amLODipine (NORVASC) 5 MG tablet TAKE ONE TABLET BY MOUTH TWICE DAILY Qty: 60 tablet, Refills: 3    aspirin 81 MG tablet Take 81 mg by mouth daily.    Cyanocobalamin (VITAMIN  B-12) 500 MCG SUBL Place 500 mcg under the tongue daily.    CycloSPORINE (RESTASIS OP) Apply to eye 2 (two) times daily as needed.     diazepam (VALIUM) 5 MG tablet Take 2.5 mg by mouth every 12 (twelve) hours as needed.     fish oil-omega-3 fatty acids 1000 MG capsule Take 1 g by mouth 2 (two) times daily.     hydrALAZINE (APRESOLINE) 25 MG tablet TAKE ONE TABLET BY MOUTH TWICE DAILY Qty: 60 tablet, Refills: 3    lactase (LACTAID) 3000 units tablet Take 6,000 Units by mouth 3 (three) times daily with meals.    latanoprost (XALATAN) 0.005 % ophthalmic solution Place 1 drop into both eyes at bedtime.    loperamide (IMODIUM) 2 MG capsule Take 2 mg by mouth 2 (two) times daily as needed.     losartan (COZAAR) 100 MG tablet Take 100 mg by mouth daily.    Melatonin 3 MG TABS Take 1 tablet by mouth at bedtime.    Multiple Vitamin (MULTIVITAMIN) tablet Take 1 tablet by mouth daily.      Polyethyl Glycol-Propyl Glycol (SYSTANE OP) Apply to eye 4 (four) times daily.    sulfaSALAzine (AZULFIDINE) 500 MG tablet Take 500 mg by mouth 4 (four) times daily.      STOP taking these medications     mesalamine (LIALDA) 1.2 g EC tablet         If you experience worsening of your admission symptoms, develop shortness of breath, life threatening emergency, suicidal or homicidal thoughts you must seek medical attention immediately by  calling 911 or calling your MD immediately  if symptoms less severe.  You Must read complete instructions/literature along with all the possible adverse reactions/side effects for all the Medicines you take and that have been prescribed to you. Take any new Medicines after you have completely understood and accept all the possible adverse reactions/side effects.   Please note  You were cared for by a hospitalist during your hospital stay. If you have any questions about your discharge medications or the care you received while you were in the hospital after you are  discharged, you can call the unit and asked to speak with the hospitalist on call if the hospitalist that took care of you is not available. Once you are discharged, your primary care physician will handle any further medical issues. Please note that NO REFILLS for any discharge medications will be authorized once you are discharged, as it is imperative that you return to your primary care physician (or establish a relationship with a primary care physician if you do not have one) for your aftercare needs so that they can reassess your need for medications and monitor your lab values. Today   SUBJECTIVE  weak   VITAL SIGNS:  Blood pressure (!) 121/56, pulse 73, temperature 97.2 F (36.2 C), temperature source Axillary, resp. rate 16, height 5' (1.524 m), weight 58.1 kg (128 lb 1.6 oz), SpO2 96 %.  I/O:    Intake/Output Summary (Last 24 hours) at 03/27/17 1342 Last data filed at 03/27/17 0720  Gross per 24 hour  Intake           666.25 ml  Output             1700 ml  Net         -1033.75 ml    PHYSICAL EXAMINATION:  GENERAL:  83 y.o.-year-old patient lying in the bed with no acute distress.  EYES: Pupils equal, round, reactive to light and accommodation. No scleral icterus. Extraocular muscles intact.  HEENT: Head atraumatic, normocephalic. Oropharynx and nasopharynx clear.  NECK:  Supple, no jugular venous distention. No thyroid enlargement, no tenderness.  LUNGS: Normal breath sounds bilaterally, no wheezing, rales,rhonchi or crepitation. No use of accessory muscles of respiration.  CARDIOVASCULAR: S1, S2 normal. No murmurs, rubs, or gallops.  ABDOMEN: Soft, non-tender, non-distended. Bowel sounds present. No organomegaly or mass.  EXTREMITIES: No pedal edema, cyanosis, or clubbing.  NEUROLOGIC: Cranial nerves II through XII are intact. Muscle strength 5/5 in all extremities. Sensation intact. Gait not checked.  PSYCHIATRIC: The patient is alert and oriented x 3.  SKIN: No obvious  rash, lesion, or ulcer.   DATA REVIEW:   CBC   Recent Labs Lab 03/25/17 0601  WBC 9.8  HGB 11.4*  HCT 32.4*  PLT 193    Chemistries   Recent Labs Lab 03/24/17 2156  03/27/17 0338  NA 121*  < > 132*  K 2.0*  < > 3.3*  CL 93*  < > 110  CO2 18*  < > 19*  GLUCOSE 128*  < > 102*  BUN 48*  < > 10  CREATININE 1.75*  < > 0.56  CALCIUM 7.8*  < > 8.2*  MG  --   < > 1.5*  AST 19  --   --   ALT 17  --   --   ALKPHOS 63  --   --   BILITOT 0.8  --   --   < > = values in this interval not displayed.  Microbiology Results   Recent Results (from the past 240 hour(s))  C difficile quick scan w PCR reflex     Status: None   Collection Time: 03/25/17  3:50 AM  Result Value Ref Range Status   C Diff antigen NEGATIVE NEGATIVE Final   C Diff toxin NEGATIVE NEGATIVE Final   C Diff interpretation No C. difficile detected.  Final  Gastrointestinal Panel by PCR , Stool     Status: None   Collection Time: 03/25/17  3:50 AM  Result Value Ref Range Status   Campylobacter species NOT DETECTED NOT DETECTED Final   Plesimonas shigelloides NOT DETECTED NOT DETECTED Final   Salmonella species NOT DETECTED NOT DETECTED Final   Yersinia enterocolitica NOT DETECTED NOT DETECTED Final   Vibrio species NOT DETECTED NOT DETECTED Final   Vibrio cholerae NOT DETECTED NOT DETECTED Final   Enteroaggregative E coli (EAEC) NOT DETECTED NOT DETECTED Final   Enteropathogenic E coli (EPEC) NOT DETECTED NOT DETECTED Final   Enterotoxigenic E coli (ETEC) NOT DETECTED NOT DETECTED Final   Shiga like toxin producing E coli (STEC) NOT DETECTED NOT DETECTED Final   Shigella/Enteroinvasive E coli (EIEC) NOT DETECTED NOT DETECTED Final   Cryptosporidium NOT DETECTED NOT DETECTED Final   Cyclospora cayetanensis NOT DETECTED NOT DETECTED Final   Entamoeba histolytica NOT DETECTED NOT DETECTED Final   Giardia lamblia NOT DETECTED NOT DETECTED Final   Adenovirus F40/41 NOT DETECTED NOT DETECTED Final   Astrovirus  NOT DETECTED NOT DETECTED Final   Norovirus GI/GII NOT DETECTED NOT DETECTED Final   Rotavirus A NOT DETECTED NOT DETECTED Final   Sapovirus (I, II, IV, and V) NOT DETECTED NOT DETECTED Final    RADIOLOGY:  No results found.   Management plans discussed with the patient, family and they are in agreement.  CODE STATUS:     Code Status Orders        Start     Ordered   03/25/17 0306  Do not attempt resuscitation (DNR)  Continuous    Question Answer Comment  In the event of cardiac or respiratory ARREST Do not call a "code blue"   In the event of cardiac or respiratory ARREST Do not perform Intubation, CPR, defibrillation or ACLS   In the event of cardiac or respiratory ARREST Use medication by any route, position, wound care, and other measures to relive pain and suffering. May use oxygen, suction and manual treatment of airway obstruction as needed for comfort.      03/25/17 0305    Code Status History    Date Active Date Inactive Code Status Order ID Comments User Context   03/25/2017  2:05 AM 03/25/2017  3:05 AM Full Code [id number]  S[place], Ubaldo Glassing, DO ED    Advance Directive Documentation     Most Recent Value  Type of Advance Directive  Out of facility DNR (pink MOST or yellow form)  Pre-existing out of facility DNR order (yellow form or pink MOST form)  Physician notified to receive inpatient order  "MOST" Form in Place?  -      TOTAL TIME TAKING CARE OF THIS PATIENT: *40* minutes.    Consepcion Utt M.D on 03/27/2017 at 1:42 PM  Between 7am to 6pm - Pager - [phone] After 6pm go to [email or web address] - password [email or web address] [facility]  Office  [phone]  CC: Primary care physician; Venia Carbon, MD

## 2017-03-27 NOTE — NC FL2 (Signed)
[place] LEVEL OF CARE SCREENING TOOL     IDENTIFICATION  Patient Name: Sabrina Hall Birthdate: August 07, 1923 Sex: female Admission Date (Current Location): 03/24/2017  [place] and [place] Number:  Engineering geologist and Address:  [facility], [address], [place], [place] [address]      Provider Number: [id number]  Attending Physician Name and Address:  Fritzi Mandes, MD  Relative Name and Phone Number:       Current Level of Care: Hospital Recommended Level of Care: [place] Prior Approval Number:    Date Approved/Denied:   PASRR Number: [id number]  Discharge Plan: SNF    Current Diagnoses: Patient Active Problem List   Diagnosis Date Noted  . Dehydration with hyponatremia 03/25/2017  . Vitamin B12 deficiency   . Mood disorder ([place]) 01/31/2016  . Sleep disorder 12/07/2015  . Bradycardia 02/28/2013  . POLYP, COLON 11/22/2007  . Essential hypertension 11/22/2007  . Crohn's disease ([place]) 11/22/2007  . DIVERTICULITIS OF COLON 11/22/2007  . Generalized osteoarthritis of multiple sites 11/22/2007  . SKIN CANCER, HX OF 11/22/2007    Orientation RESPIRATION BLADDER Height & Weight     Self, Place, Situation  Normal Continent Weight: 128 lb 1.6 oz (58.1 kg) Height:  5' (152.4 cm)  BEHAVIORAL SYMPTOMS/MOOD NEUROLOGICAL BOWEL NUTRITION STATUS   (none)  (none) Incontinent Diet (to be advanced as tolerated)  AMBULATORY STATUS COMMUNICATION OF NEEDS Skin   Extensive Assist Verbally Normal                       Personal Care Assistance Level of Assistance  Dressing, Bathing Bathing Assistance: Limited assistance   Dressing Assistance: Limited assistance     Functional Limitations Info  Hearing   Hearing Info: Impaired      SPECIAL CARE FACTORS FREQUENCY  PT (By licensed PT)                    Contractures Contractures Info: Not present    Additional Factors Info  Code Status,  Allergies Code Status Info: dnr Allergies Info: lactose,maxzide nasaids, pcn's; sulfaonamide derivitives           Current Medications (03/27/2017):  This is the current hospital active medication list Current Facility-Administered Medications  Medication Dose Route Frequency Provider Last Rate Last Dose  . 0.9 % NaCl with KCl 40 mEq / L  infusion   Intravenous Continuous Fritzi Mandes, MD 75 mL/hr at 03/26/17 1123 75 mL/hr at 03/26/17 1123  . acetaminophen (TYLENOL) tablet 650 mg  650 mg Oral Q6H PRN Hugelmeyer, Alexis, DO       Or  . acetaminophen (TYLENOL) suppository 650 mg  650 mg Rectal Q6H PRN Hugelmeyer, Alexis, DO      . albuterol (PROVENTIL) (2.5 MG/3ML) 0.083% nebulizer solution 2.5 mg  2.5 mg Nebulization Q6H PRN Hugelmeyer, Alexis, DO      . amLODipine (NORVASC) tablet 5 mg  5 mg Oral BID Hugelmeyer, Alexis, DO   5 mg at 03/27/17 5038  . aspirin chewable tablet 81 mg  81 mg Oral Daily Hugelmeyer, Alexis, DO   81 mg at 03/27/17 0843  . bisacodyl (DULCOLAX) EC tablet 5 mg  5 mg Oral Daily PRN Hugelmeyer, Alexis, DO      . enoxaparin (LOVENOX) injection 40 mg  40 mg Subcutaneous Q24H Marcelle Overlie D, RPH   40 mg at 03/26/17 2233  . hydrALAZINE (APRESOLINE) tablet 25 mg  25 mg Oral BID  Hugelmeyer, Alexis, DO   25 mg at 03/27/17 0843  . ipratropium (ATROVENT) nebulizer solution 0.5 mg  0.5 mg Nebulization Q6H PRN Hugelmeyer, Alexis, DO      . lactase (LACTAID) tablet 6,000 Units  6,000 Units Oral TID WC Hugelmeyer, Alexis, DO   6,000 Units at 03/27/17 0857  . losartan (COZAAR) tablet 100 mg  100 mg Oral Daily Hugelmeyer, Alexis, DO   100 mg at 03/27/17 0843  . magnesium citrate solution 1 Bottle  1 Bottle Oral Once PRN Hugelmeyer, Alexis, DO      . mesalamine (LIALDA) EC tablet 4.8 g  4.8 g Oral Q breakfast Hugelmeyer, Alexis, DO   4.8 g at 03/27/17 0857  . multivitamin with minerals tablet 1 tablet  1 tablet Oral Daily Hugelmeyer, Alexis, DO   1 tablet at 03/27/17 0842  .  ondansetron (ZOFRAN) tablet 4 mg  4 mg Oral Q6H PRN Hugelmeyer, Alexis, DO       Or  . ondansetron (ZOFRAN) injection 4 mg  4 mg Intravenous Q6H PRN Hugelmeyer, Alexis, DO      . oxyCODONE (Oxy IR/ROXICODONE) immediate release tablet 5 mg  5 mg Oral Q4H PRN Hugelmeyer, Alexis, DO   5 mg at 03/27/17 0842  . potassium chloride SA (K-DUR,KLOR-CON) CR tablet 20 mEq  20 mEq Oral Q4H PRN Harvest Dark, MD   20 mEq at 03/24/17 2301  . potassium chloride SA (K-DUR,KLOR-CON) CR tablet 20 mEq  20 mEq Oral Daily Ramond Dial, RPH   20 mEq at 03/27/17 0843  . senna-docusate (Senokot-S) tablet 1 tablet  1 tablet Oral QHS PRN Hugelmeyer, Alexis, DO      . vitamin B-12 (CYANOCOBALAMIN) tablet 500 mcg  500 mcg Oral Daily Hugelmeyer, Alexis, DO   500 mcg at 03/27/17 2202     Discharge Medications: Please see discharge summary for a list of discharge medications.  Relevant Imaging Results:  Relevant Lab Results:   Additional Information    Shela Leff, LCSW

## 2017-03-27 NOTE — Clinical Social Work Placement (Signed)
   CLINICAL SOCIAL WORK PLACEMENT  NOTE  Date:  03/27/2017  Patient Details  Name: Sabrina Hall MRN: [id number] Date of Birth: 06-20-1923  Clinical Social Work is seeking post-discharge placement for this patient at the [place] level of care (*CSW will initial, date and re-position this form in  chart as items are completed):  Yes   Patient/family provided with [place] Work Department's list of facilities offering this level of care within the geographic area requested by the patient (or if unable, by the patient's family).  Yes   Patient/family informed of their freedom to choose among providers that offer the needed level of care, that participate in Medicare, Medicaid or managed care program needed by the patient, have an available bed and are willing to accept the patient.  Yes   Patient/family informed of [facility]'s ownership interest in [facility] and [facility], as well as of the fact that they are under no obligation to receive care at these facilities.  PASRR submitted to EDS on 03/27/17     PASRR number received on 03/27/17     Existing PASRR number confirmed on       FL2 transmitted to all facilities in geographic area requested by pt/family on 03/27/17     FL2 transmitted to all facilities within larger geographic area on       Patient informed that his/her managed care company has contracts with or will negotiate with certain facilities, including the following:        Yes   Patient/family informed of bed offers received.  Patient chooses bed at  [facility])     Physician recommends and patient chooses bed at  [facility])    Patient to be transferred to  [facility]) on 03/27/17.  Patient to be transferred to facility by  [facility] transportation)     Patient family notified on 03/27/17 of transfer.  Name of family member notified:   (daughter and granddaughter)     PHYSICIAN       Additional Comment:     _______________________________________________ Shela Leff, LCSW 03/27/2017, 1:48 PM

## 2017-04-02 DIAGNOSIS — F39 Unspecified mood [affective] disorder: Secondary | ICD-10-CM | POA: Diagnosis not present

## 2017-04-02 DIAGNOSIS — E441 Mild protein-calorie malnutrition: Secondary | ICD-10-CM | POA: Diagnosis not present

## 2017-04-02 DIAGNOSIS — I10 Essential (primary) hypertension: Secondary | ICD-10-CM | POA: Diagnosis not present

## 2017-04-02 DIAGNOSIS — K5000 Crohn's disease of small intestine without complications: Secondary | ICD-10-CM | POA: Diagnosis not present

## 2017-05-04 ENCOUNTER — Ambulatory Visit: Payer: Medicare Other | Admitting: Internal Medicine

## 2017-05-11 ENCOUNTER — Ambulatory Visit: Payer: Medicare Other | Admitting: Gastroenterology

## 2017-05-28 ENCOUNTER — Encounter: Payer: Self-pay | Admitting: Internal Medicine

## 2017-05-28 ENCOUNTER — Non-Acute Institutional Stay: Payer: Medicare Other | Admitting: Internal Medicine

## 2017-05-28 VITALS — BP 128/70 | HR 92 | Temp 98.7°F | Resp 16 | Wt 128.0 lb

## 2017-05-28 DIAGNOSIS — G479 Sleep disorder, unspecified: Secondary | ICD-10-CM | POA: Diagnosis not present

## 2017-05-28 DIAGNOSIS — F39 Unspecified mood [affective] disorder: Secondary | ICD-10-CM | POA: Diagnosis not present

## 2017-05-28 DIAGNOSIS — K5090 Crohn's disease, unspecified, without complications: Secondary | ICD-10-CM | POA: Diagnosis not present

## 2017-05-28 DIAGNOSIS — I10 Essential (primary) hypertension: Secondary | ICD-10-CM | POA: Diagnosis not present

## 2017-05-28 NOTE — Progress Notes (Addendum)
Subjective:    Patient ID: Sabrina Hall, female    DOB: 12/31/1922, 83 y.o.   MRN: [id number]  HPI Assisted living visit for review of chronic health conditions Reviewed status with [name] RN  Recent hospitalization for Crohn's flare when went off lialda due to expense Sent for rehab and prednisone started there by me --which helped flare Now on balsalazide for long term Rx and weaned off the prednisone Still variable bowels---still has diarrhea at times. Wears protection just in case Has imodium for prn use Reviewed recent GI visit---- and labs. Potassium almost back to normal Done with outpatient PT  Walks with walker for safety (cane in room usually)  Discussed her hospital experience Clearly had delirium which has cleared  Mood is okay No regular anxiety Hasn't really needed the diazepam for the most part during the day--but it does help her sleep Not depressed  No chest pain No SOB No dizziness No edema  Current Outpatient Prescriptions on File Prior to Visit  Medication Sig Dispense Refill  . amLODipine (NORVASC) 5 MG tablet TAKE ONE TABLET BY MOUTH TWICE DAILY 60 tablet 3  . aspirin 81 MG tablet Take 81 mg by mouth daily.    . Cyanocobalamin (VITAMIN B-12) 500 MCG SUBL Place 500 mcg under the tongue daily.    . CycloSPORINE (RESTASIS OP) Apply to eye 2 (two) times daily as needed.     . diazepam (VALIUM) 5 MG tablet Take 2.5 mg by mouth every 12 (twelve) hours as needed.     . fish oil-omega-3 fatty acids 1000 MG capsule Take 1 g by mouth 2 (two) times daily.     . hydrALAZINE (APRESOLINE) 25 MG tablet TAKE ONE TABLET BY MOUTH TWICE DAILY 60 tablet 3  . lactase (LACTAID) 3000 units tablet Take 6,000 Units by mouth 3 (three) times daily with meals.    . latanoprost (XALATAN) 0.005 % ophthalmic solution Place 1 drop into both eyes at bedtime.    Marland Kitchen loperamide (IMODIUM) 2 MG capsule Take 2 mg by mouth 2 (two) times daily as needed.     Marland Kitchen losartan (COZAAR) 100 MG  tablet Take 100 mg by mouth daily.    . Melatonin 3 MG TABS Take 1 tablet by mouth at bedtime.    . Multiple Vitamin (MULTIVITAMIN) tablet Take 1 tablet by mouth daily.      Vladimir Faster Glycol-Propyl Glycol (SYSTANE OP) Apply to eye 4 (four) times daily.     No current facility-administered medications on file prior to visit.     Allergies  Allergen Reactions  . Lactose   . Maxzide [Hydrochlorothiazide W-Triamterene]   . Nsaids   . Penicillins   . Sulfonamide Derivatives     Past Medical History:  Diagnosis Date  . Arthritis   . Cataract   . Crohn's   . Deviated septum   . Eczema   . H/O seasonal allergies   . History of hemorrhoids   . History of shingles   . Hypertension   . Psoriasis   . Vitamin B12 deficiency     Past Surgical History:  Procedure Laterality Date  . basel cell carcinoma    . CATARACT EXTRACTION     right eye  . CHOLECYSTECTOMY    . HYSTERECTOMY    . ILEOCECOSTOMY    . MOHS SURGERY    . NASAL SEPTUM SURGERY    . NECK SURGERY  2004   mass removed from cervical spine  . NOSE SURGERY  basal cell carcinoma of nose    Family History  Problem Relation Age of Onset  . Stroke Father   . Cancer Neg Hx   . Diabetes Neg Hx     Social History   Social History  . Marital status: Widowed    Spouse name: N/A  . Number of children: 2  . Years of education: N/A   Occupational History  . Schoolteacher     retired   Social History Main Topics  . Smoking status: Former Smoker    Packs/day: 0.50    Types: Cigarettes    Quit date: 09/09/1981  . Smokeless tobacco: Never Used  . Alcohol use Yes     Comment: occassionally  . Drug use: No  . Sexual activity: Not on file   Other Topics Concern  . Not on file   Social History Narrative   Has living will    Daughter is health care POA   Has DNR order--this is confirmed at visit 12/07/15   No tube feeds if cognitively unaware   Review of Systems Appetite is good Weight is stable No  urinary problems "Rash" between shoulder blades--some itching (PE shows no rash--discussed getting help to put on moisturizer after showering)    Objective:   Physical Exam  Constitutional: She appears well-nourished.  Neck: No thyromegaly present.  Cardiovascular: Normal rate and regular rhythm.  Exam reveals no gallop.   Gr 2/6 systolic murmur throughout precordium  Pulmonary/Chest: Effort normal and breath sounds normal. No respiratory distress. She has no wheezes. She has no rales.  Abdominal: Soft. Bowel sounds are normal. She exhibits no distension. There is no tenderness. There is no rebound and no guarding.  Musculoskeletal: She exhibits no edema or tenderness.  Skin: No rash noted.  Psychiatric: She has a normal mood and affect. Her behavior is normal.          Assessment & Plan:

## 2017-05-28 NOTE — Assessment & Plan Note (Signed)
Gets melatonin Urged her to limit her use of the diazepam (likely part of the reason for her delirium)

## 2017-05-28 NOTE — Assessment & Plan Note (Signed)
No regular anxiety Not depressed No regular Rx needed

## 2017-05-28 NOTE — Assessment & Plan Note (Signed)
Doing okay again on the new controller med Discussed asking for the imodium before going out

## 2017-05-28 NOTE — Assessment & Plan Note (Signed)
BP Readings from Last 3 Encounters:  05/28/17 128/70  03/27/17 (!) 121/56  03/18/17 (!) 116/56   Good control No change needed

## 2017-06-17 ENCOUNTER — Ambulatory Visit: Payer: Medicare Other | Admitting: Internal Medicine

## 2017-06-17 VITALS — BP 100/43 | HR 85 | Resp 24

## 2017-06-17 DIAGNOSIS — B351 Tinea unguium: Secondary | ICD-10-CM | POA: Diagnosis not present

## 2017-06-18 ENCOUNTER — Encounter: Payer: Self-pay | Admitting: Internal Medicine

## 2017-06-18 NOTE — Patient Instructions (Signed)

## 2017-06-18 NOTE — Progress Notes (Signed)
Subjective:    Patient ID: Sabrina Hall, female    DOB: 1923/03/02, 83 y.o.   MRN: [id number]  HPI  Asked to see resident in [address] Requesting nail trim, nails thick and long, getting caught on socks causing pian.   Review of Systems      Past Medical History:  Diagnosis Date  . Arthritis   . Cataract   . Crohn's   . Deviated septum   . Eczema   . H/O seasonal allergies   . History of hemorrhoids   . History of shingles   . Hypertension   . Psoriasis   . Vitamin B12 deficiency     Current Outpatient Prescriptions  Medication Sig Dispense Refill  . amLODipine (NORVASC) 5 MG tablet TAKE ONE TABLET BY MOUTH TWICE DAILY 60 tablet 3  . aspirin 81 MG tablet Take 81 mg by mouth daily.    . balsalazide (COLAZAL) 750 MG capsule Take 1,500 mg by mouth 2 (two) times daily.    . Cyanocobalamin (VITAMIN B-12) 500 MCG SUBL Place 500 mcg under the tongue daily.    . CycloSPORINE (RESTASIS OP) Apply to eye 2 (two) times daily as needed.     . diazepam (VALIUM) 5 MG tablet Take 2.5 mg by mouth every 12 (twelve) hours as needed.     . fish oil-omega-3 fatty acids 1000 MG capsule Take 1 g by mouth 2 (two) times daily.     . hydrALAZINE (APRESOLINE) 25 MG tablet TAKE ONE TABLET BY MOUTH TWICE DAILY 60 tablet 3  . lactase (LACTAID) 3000 units tablet Take 6,000 Units by mouth 3 (three) times daily with meals.    . latanoprost (XALATAN) 0.005 % ophthalmic solution Place 1 drop into both eyes at bedtime.    Marland Kitchen loperamide (IMODIUM) 2 MG capsule Take 2 mg by mouth 2 (two) times daily as needed.     Marland Kitchen losartan (COZAAR) 100 MG tablet Take 100 mg by mouth daily.    . magnesium oxide (MAG-OX) 400 MG tablet Take 400 mg by mouth daily.    . Melatonin 3 MG TABS Take 1 tablet by mouth at bedtime.    . Multiple Vitamin (MULTIVITAMIN) tablet Take 1 tablet by mouth daily.      Vladimir Faster Glycol-Propyl Glycol (SYSTANE OP) Apply to eye 4 (four) times daily.     No current facility-administered  medications for this visit.     Allergies  Allergen Reactions  . Lactose   . Maxzide [Hydrochlorothiazide W-Triamterene]   . Nsaids   . Penicillins   . Sulfonamide Derivatives     Family History  Problem Relation Age of Onset  . Stroke Father   . Cancer Neg Hx   . Diabetes Neg Hx     Social History   Social History  . Marital status: Widowed    Spouse name: N/A  . Number of children: 2  . Years of education: N/A   Occupational History  . Schoolteacher     retired   Social History Main Topics  . Smoking status: Former Smoker    Packs/day: 0.50    Types: Cigarettes    Quit date: 09/09/1981  . Smokeless tobacco: Never Used  . Alcohol use Yes     Comment: occassionally  . Drug use: No  . Sexual activity: Not on file   Other Topics Concern  . Not on file   Social History Narrative   Has living will    Daughter is health  care POA   Has DNR order--this is confirmed at visit 12/07/15   No tube feeds if cognitively unaware     Constitutional: Denies fever, malaise, fatigue, headache or abrupt weight changes.  Skin: Pt reports thick, long toenails. Denies redness, rashes, lesions or ulcercations.    No other specific complaints in a complete review of systems (except as listed in HPI above).  Objective:   Physical Exam   BP (!) 100/43   Pulse 85   Resp (!) 24  Wt Readings from Last 3 Encounters:  05/28/17 128 lb (58.1 kg)  03/24/17 128 lb 1.6 oz (58.1 kg)  03/18/17 126 lb (57.2 kg)    General: Appears her stated age, well developed, well nourished in NAD. Skin: Mycotic toenails bilaterally  BMET    Component Value Date/Time   NA 132 (L) 03/27/2017 0338   NA 135 (L) 10/07/2014 1142   K 3.3 (L) 03/27/2017 0338   K 3.5 10/07/2014 1142   CL 110 03/27/2017 0338   CL 96 (L) 10/07/2014 1142   CO2 19 (L) 03/27/2017 0338   CO2 31 10/07/2014 1142   GLUCOSE 102 (H) 03/27/2017 0338   GLUCOSE 252 (H) 10/07/2014 1142   BUN 10 03/27/2017 0338   BUN 16  10/07/2014 1142   CREATININE 0.56 03/27/2017 0338   CREATININE 0.96 10/07/2014 1142   CALCIUM 8.2 (L) 03/27/2017 0338   CALCIUM 8.8 10/07/2014 1142   GFRNONAA >60 03/27/2017 0338   GFRNONAA 58 (L) 10/07/2014 1142   GFRNONAA 59 (L) 05/25/2014 1136   GFRAA >60 03/27/2017 0338   GFRAA >60 10/07/2014 1142   GFRAA >60 05/25/2014 1136    Lipid Panel  No results found for: CHOL, TRIG, HDL, CHOLHDL, VLDL, LDLCALC  CBC    Component Value Date/Time   WBC 9.8 03/25/2017 0601   RBC 3.75 (L) 03/25/2017 0601   HGB 11.4 (L) 03/25/2017 0601   HGB 14.8 10/07/2014 1142   HCT 32.4 (L) 03/25/2017 0601   HCT 45.0 10/07/2014 1142   PLT 193 03/25/2017 0601   PLT 169 10/07/2014 1142   MCV 86.4 03/25/2017 0601   MCV 94 10/07/2014 1142   MCH 30.5 03/25/2017 0601   MCHC 35.2 03/25/2017 0601   RDW 13.2 03/25/2017 0601   RDW 13.1 10/07/2014 1142   LYMPHSABS 1.4 12/07/2015 1712   LYMPHSABS 0.8 (L) 10/07/2014 1142   MONOABS 0.7 12/07/2015 1712   MONOABS 0.5 10/07/2014 1142   EOSABS 0.3 12/07/2015 1712   EOSABS 0.1 10/07/2014 1142   BASOSABS 0.1 12/07/2015 1712   BASOSABS 0.0 10/07/2014 1142    Hgb A1C No results found for: HGBA1C        Assessment & Plan:   Mycotic Toenails:  Trimmed by provider using dremmel tool  Will follow up as needed Webb Silversmith, NP

## 2017-08-12 ENCOUNTER — Ambulatory Visit: Payer: Medicare Other | Admitting: Internal Medicine

## 2017-08-12 ENCOUNTER — Encounter: Payer: Self-pay | Admitting: Internal Medicine

## 2017-08-12 DIAGNOSIS — I10 Essential (primary) hypertension: Secondary | ICD-10-CM | POA: Diagnosis not present

## 2017-08-12 DIAGNOSIS — G479 Sleep disorder, unspecified: Secondary | ICD-10-CM | POA: Diagnosis not present

## 2017-08-12 DIAGNOSIS — M159 Polyosteoarthritis, unspecified: Secondary | ICD-10-CM

## 2017-08-12 DIAGNOSIS — K5090 Crohn's disease, unspecified, without complications: Secondary | ICD-10-CM | POA: Diagnosis not present

## 2017-08-12 NOTE — Progress Notes (Signed)
Subjective:    Patient ID: Sabrina Hall, female    DOB: 03-22-1923, 83 y.o.   MRN: [id number]  HPI  Routine visit for resident in [address] Reviewed status with [name] RN, no new concerns Resident feels like she is sleeping well with use of Melatonin and Valium at night. She walks independently in her apt, uses rollater when she goes out of her apt. Still independent of ADL's Appetite is good, weight is stable No issues with bowel or bladder.  Osteoarthritis: She reports she does not have a lot of pain issues. She has Tylenol to take if needed.  Crohn's: She reports she constantly has loose bowels. She is taking Bentyl and Imodium as needed. She takes Balsalazide as prescribed. She has not seen any blood in her stool.   HTN: Her BP is controlled on Losartan, Amlodipine and Hydralazine.  Sleep Disorder: Stable on Melatonin and Valium.  Review of Systems      Past Medical History:  Diagnosis Date  . Arthritis   . Cataract   . Crohn's   . Deviated septum   . Eczema   . H/O seasonal allergies   . History of hemorrhoids   . History of shingles   . Hypertension   . Psoriasis   . Vitamin B12 deficiency     Current Outpatient Medications  Medication Sig Dispense Refill  . amLODipine (NORVASC) 5 MG tablet TAKE ONE TABLET BY MOUTH TWICE DAILY 60 tablet 3  . aspirin 81 MG tablet Take 81 mg by mouth daily.    . balsalazide (COLAZAL) 750 MG capsule Take 1,500 mg by mouth 2 (two) times daily.    . Cyanocobalamin (VITAMIN B-12) 500 MCG SUBL Place 500 mcg under the tongue daily.    . CycloSPORINE (RESTASIS OP) Apply to eye 2 (two) times daily as needed.     . diazepam (VALIUM) 5 MG tablet Take 2.5 mg by mouth every 12 (twelve) hours as needed.     . fish oil-omega-3 fatty acids 1000 MG capsule Take 1 g by mouth 2 (two) times daily.     . hydrALAZINE (APRESOLINE) 25 MG tablet TAKE ONE TABLET BY MOUTH TWICE DAILY 60 tablet 3  . lactase (LACTAID) 3000 units tablet Take 6,000  Units by mouth 3 (three) times daily with meals.    . latanoprost (XALATAN) 0.005 % ophthalmic solution Place 1 drop into both eyes at bedtime.    Marland Kitchen loperamide (IMODIUM) 2 MG capsule Take 2 mg by mouth 2 (two) times daily as needed.     Marland Kitchen losartan (COZAAR) 100 MG tablet Take 100 mg by mouth daily.    . magnesium oxide (MAG-OX) 400 MG tablet Take 400 mg by mouth daily.    . Melatonin 3 MG TABS Take 1 tablet by mouth at bedtime.    . Multiple Vitamin (MULTIVITAMIN) tablet Take 1 tablet by mouth daily.      Vladimir Faster Glycol-Propyl Glycol (SYSTANE OP) Apply to eye 4 (four) times daily.     No current facility-administered medications for this visit.     Allergies  Allergen Reactions  . Lactose   . Maxzide [Hydrochlorothiazide W-Triamterene]   . Nsaids   . Penicillins   . Sulfonamide Derivatives     Family History  Problem Relation Age of Onset  . Stroke Father   . Cancer Neg Hx   . Diabetes Neg Hx     Social History   Socioeconomic History  . Marital status: Widowed  Spouse name: Not on file  . Number of children: 2  . Years of education: Not on file  . Highest education level: Not on file  Social Needs  . Financial resource strain: Not on file  . Food insecurity - worry: Not on file  . Food insecurity - inability: Not on file  . Transportation needs - medical: Not on file  . Transportation needs - non-medical: Not on file  Occupational History  . Occupation: Radio producer    Comment: retired  Tobacco Use  . Smoking status: Former Smoker    Packs/day: 0.50    Types: Cigarettes    Last attempt to quit: 09/09/1981    Years since quitting: 35.9  . Smokeless tobacco: Never Used  Substance and Sexual Activity  . Alcohol use: Yes    Comment: occassionally  . Drug use: No  . Sexual activity: Not on file  Other Topics Concern  . Not on file  Social History Narrative   Has living will    Daughter is health care POA   Has DNR order--this is confirmed at visit  12/07/15   No tube feeds if cognitively unaware     Constitutional: Denies fever, malaise, fatigue, headache or abrupt weight changes.  HEENT: Denies eye pain, eye redness, ear pain, ringing in the ears, wax buildup, runny nose, nasal congestion, bloody nose, or sore throat. Respiratory: Denies difficulty breathing, shortness of breath, cough or sputum production.   Cardiovascular: Denies chest pain, chest tightness, palpitations or swelling in the hands or feet.  Gastrointestinal: Pt reports loose bowels. Denies abdominal pain, bloating, constipation, or blood in the stool.  GU: Denies urgency, frequency, pain with urination, burning sensation, blood in urine, odor or discharge. Musculoskeletal: Denies decrease in range of motion, difficulty with gait, muscle pain or joint pain and swelling.  Skin: Denies redness, rashes, lesions or ulcercations.  Neurological: Denies dizziness, difficulty with memory, difficulty with speech or problems with balance and coordination.  Psych: Denies anxiety, depression, SI/HI.  No other specific complaints in a complete review of systems (except as listed in HPI above).  Objective:   Physical Exam   BP (!) 134/58   Pulse 88   Temp 98.4 F (36.9 C)   Resp 16   Wt 128 lb (58.1 kg)   BMI 25.00 kg/m  Wt Readings from Last 3 Encounters:  08/12/17 128 lb (58.1 kg)  05/28/17 128 lb (58.1 kg)  03/24/17 128 lb 1.6 oz (58.1 kg)    General: Appears her stated age, in NAD. Neck:  Neck supple. No masses present.  Cardiovascular: Normal rate and rhythm. S1,S2 noted.  No murmur, rubs or gallops noted.  Pulmonary/Chest: Normal effort and positive vesicular breath sounds. No respiratory distress. No wheezes, rales or ronchi noted.  Abdomen: Soft and nontender. Normal bowel sounds.  Neurological: Alert and oriented.    BMET    Component Value Date/Time   NA 132 (L) 03/27/2017 0338   NA 135 (L) 10/07/2014 1142   K 3.3 (L) 03/27/2017 0338   K 3.5  10/07/2014 1142   CL 110 03/27/2017 0338   CL 96 (L) 10/07/2014 1142   CO2 19 (L) 03/27/2017 0338   CO2 31 10/07/2014 1142   GLUCOSE 102 (H) 03/27/2017 0338   GLUCOSE 252 (H) 10/07/2014 1142   BUN 10 03/27/2017 0338   BUN 16 10/07/2014 1142   CREATININE 0.56 03/27/2017 0338   CREATININE 0.96 10/07/2014 1142   CALCIUM 8.2 (L) 03/27/2017 0338   CALCIUM 8.8  10/07/2014 1142   GFRNONAA >60 03/27/2017 0338   GFRNONAA 58 (L) 10/07/2014 1142   GFRNONAA 59 (L) 05/25/2014 1136   GFRAA >60 03/27/2017 0338   GFRAA >60 10/07/2014 1142   GFRAA >60 05/25/2014 1136    Lipid Panel  No results found for: CHOL, TRIG, HDL, CHOLHDL, VLDL, LDLCALC  CBC    Component Value Date/Time   WBC 9.8 03/25/2017 0601   RBC 3.75 (L) 03/25/2017 0601   HGB 11.4 (L) 03/25/2017 0601   HGB 14.8 10/07/2014 1142   HCT 32.4 (L) 03/25/2017 0601   HCT 45.0 10/07/2014 1142   PLT 193 03/25/2017 0601   PLT 169 10/07/2014 1142   MCV 86.4 03/25/2017 0601   MCV 94 10/07/2014 1142   MCH 30.5 03/25/2017 0601   MCHC 35.2 03/25/2017 0601   RDW 13.2 03/25/2017 0601   RDW 13.1 10/07/2014 1142   LYMPHSABS 1.4 12/07/2015 1712   LYMPHSABS 0.8 (L) 10/07/2014 1142   MONOABS 0.7 12/07/2015 1712   MONOABS 0.5 10/07/2014 1142   EOSABS 0.3 12/07/2015 1712   EOSABS 0.1 10/07/2014 1142   BASOSABS 0.1 12/07/2015 1712   BASOSABS 0.0 10/07/2014 1142    Hgb A1C No results found for: HGBA1C         Assessment & Plan:

## 2017-08-12 NOTE — Assessment & Plan Note (Signed)
Chronic loose bowels Controlled on Balsalazide, Bentyl and Imodium Will monitor

## 2017-08-12 NOTE — Assessment & Plan Note (Signed)
Continue Tylenol prn

## 2017-08-12 NOTE — Assessment & Plan Note (Signed)
Continue Melotonin nightly and Valium prn I would like to eventually try to get her off the Valium but will continue for now

## 2017-08-12 NOTE — Assessment & Plan Note (Signed)
Controlled on multiple meds Will monitor

## 2017-11-19 ENCOUNTER — Ambulatory Visit: Payer: Medicare Other | Admitting: Internal Medicine

## 2017-11-19 ENCOUNTER — Encounter: Payer: Self-pay | Admitting: Internal Medicine

## 2017-11-19 VITALS — BP 148/70 | HR 76 | Temp 97.6°F | Resp 18 | Wt 126.2 lb

## 2017-11-19 DIAGNOSIS — K5090 Crohn's disease, unspecified, without complications: Secondary | ICD-10-CM | POA: Diagnosis not present

## 2017-11-19 DIAGNOSIS — F39 Unspecified mood [affective] disorder: Secondary | ICD-10-CM

## 2017-11-19 DIAGNOSIS — M159 Polyosteoarthritis, unspecified: Secondary | ICD-10-CM

## 2017-11-19 DIAGNOSIS — I10 Essential (primary) hypertension: Secondary | ICD-10-CM | POA: Diagnosis not present

## 2017-11-19 NOTE — Assessment & Plan Note (Signed)
BP Readings from Last 3 Encounters:  11/19/17 (!) 148/70  08/12/17 (!) 134/58  06/18/17 (!) 100/43   Reasonable control for age No changes

## 2017-11-19 NOTE — Assessment & Plan Note (Signed)
Mild symptoms persist---urgency and occ leakage No pain or blood

## 2017-11-19 NOTE — Assessment & Plan Note (Signed)
Mild anxiety Mostly over her grieving Only has the diazepam for rare bad nights

## 2017-11-19 NOTE — Progress Notes (Signed)
Subjective:    Patient ID: Sabrina Hall, female    DOB: 19-Oct-1922, 84 y.o.   MRN: [id number]  HPI Visit in [place] for review of chronic health conditions Reviewed status with [name] RN---doing well On preventative tamiflu now --documented influenza A in resident here  Worsening vision Harder to read Discussed looking into electronic reader  Bowels are okay Still gets urgency after eating and loose stools at times No abdominal pain No blood in stool  Not really any more depression Rare anxiety---has the diazepam for prn only, mostly if she is having trouble sleeping Does okay generally with the melatonin  No significant joint pain of late Walks a lot---uses rollator (mostly if out of room)  No chest pain No SOB No dizziness or syncope Mild edema--no sig change. Only as the day goes on---gone in the morning  Current Outpatient Medications on File Prior to Visit  Medication Sig Dispense Refill  . amLODipine (NORVASC) 5 MG tablet TAKE ONE TABLET BY MOUTH TWICE DAILY 60 tablet 3  . aspirin 81 MG tablet Take 81 mg by mouth daily.    . balsalazide (COLAZAL) 750 MG capsule Take 1,500 mg by mouth 2 (two) times daily.    . Cyanocobalamin (VITAMIN B-12) 500 MCG SUBL Place 500 mcg under the tongue daily.    . CycloSPORINE (RESTASIS OP) Apply to eye 2 (two) times daily as needed.     . diazepam (VALIUM) 5 MG tablet Take 2.5 mg by mouth every 12 (twelve) hours as needed.     . fish oil-omega-3 fatty acids 1000 MG capsule Take 1 g by mouth 2 (two) times daily.     . hydrALAZINE (APRESOLINE) 25 MG tablet TAKE ONE TABLET BY MOUTH TWICE DAILY 60 tablet 3  . lactase (LACTAID) 3000 units tablet Take 6,000 Units by mouth 3 (three) times daily with meals.    . latanoprost (XALATAN) 0.005 % ophthalmic solution Place 1 drop into both eyes at bedtime.    Marland Kitchen loperamide (IMODIUM) 2 MG capsule Take 2 mg by mouth 2 (two) times daily as needed.     Marland Kitchen losartan (COZAAR) 100 MG tablet Take  100 mg by mouth daily.    . magnesium oxide (MAG-OX) 400 MG tablet Take 400 mg by mouth daily.    . Melatonin 3 MG TABS Take 1 tablet by mouth at bedtime.    . Multiple Vitamin (MULTIVITAMIN) tablet Take 1 tablet by mouth daily.      Vladimir Faster Glycol-Propyl Glycol (SYSTANE OP) Apply to eye 4 (four) times daily.     No current facility-administered medications on file prior to visit.     Allergies  Allergen Reactions  . Lactose   . Maxzide [Hydrochlorothiazide W-Triamterene]   . Nsaids   . Penicillins   . Sulfonamide Derivatives     Past Medical History:  Diagnosis Date  . Arthritis   . Cataract   . Crohn's   . Deviated septum   . Eczema   . H/O seasonal allergies   . History of hemorrhoids   . History of shingles   . Hypertension   . Psoriasis   . Vitamin B12 deficiency     Past Surgical History:  Procedure Laterality Date  . basel cell carcinoma    . CATARACT EXTRACTION     right eye  . CHOLECYSTECTOMY    . HYSTERECTOMY    . ILEOCECOSTOMY    . MOHS SURGERY    . NASAL SEPTUM SURGERY    .  NECK SURGERY  2004   mass removed from cervical spine  . NOSE SURGERY     basal cell carcinoma of nose    Family History  Problem Relation Age of Onset  . Stroke Father   . Cancer Neg Hx   . Diabetes Neg Hx     Social History   Socioeconomic History  . Marital status: Widowed    Spouse name: Not on file  . Number of children: 2  . Years of education: Not on file  . Highest education level: Not on file  Social Needs  . Financial resource strain: Not on file  . Food insecurity - worry: Not on file  . Food insecurity - inability: Not on file  . Transportation needs - medical: Not on file  . Transportation needs - non-medical: Not on file  Occupational History  . Occupation: Radio producer    Comment: retired  Tobacco Use  . Smoking status: Former Smoker    Packs/day: 0.50    Types: Cigarettes    Last attempt to quit: 09/09/1981    Years since quitting: 36.2    . Smokeless tobacco: Never Used  Substance and Sexual Activity  . Alcohol use: Yes    Comment: occassionally  . Drug use: No  . Sexual activity: Not on file  Other Topics Concern  . Not on file  Social History Narrative   Has living will    Daughter is health care POA   Has DNR order--this is confirmed at visit 12/07/15   No tube feeds if cognitively unaware   Review of Systems Appetite is excellent Weight stable Sleep is variable--up every 2 hours to void often. Wears pad for occasional leakage--but mostly fecal in the past Intermittent left ear tinnitus No headaches    Objective:   Physical Exam  Constitutional: She appears well-developed. No distress.  Neck: No thyromegaly present.  Cardiovascular: Regular rhythm. Exam reveals no gallop.  Gr 2/6 systolic murmur--loudest at base  Pulmonary/Chest: Effort normal and breath sounds normal. No respiratory distress. She has no wheezes. She has no rales.  Abdominal: Soft. There is no tenderness.  Musculoskeletal: She exhibits no edema or tenderness.  Lymphadenopathy:    She has no cervical adenopathy.  Psychiatric: She has a normal mood and affect. Her behavior is normal.          Assessment & Plan:

## 2017-11-19 NOTE — Assessment & Plan Note (Signed)
Symptoms are better

## 2017-11-25 ENCOUNTER — Ambulatory Visit: Payer: Medicare Other | Admitting: Internal Medicine

## 2017-11-25 VITALS — BP 132/62 | HR 72

## 2017-11-25 DIAGNOSIS — Y92129 Unspecified place in nursing home as the place of occurrence of the external cause: Secondary | ICD-10-CM

## 2017-11-25 DIAGNOSIS — W19XXXA Unspecified fall, initial encounter: Secondary | ICD-10-CM | POA: Diagnosis not present

## 2017-11-25 DIAGNOSIS — S2232XA Fracture of one rib, left side, initial encounter for closed fracture: Secondary | ICD-10-CM

## 2017-12-04 ENCOUNTER — Encounter: Payer: Self-pay | Admitting: Internal Medicine

## 2017-12-04 ENCOUNTER — Ambulatory Visit: Payer: Medicare Other | Admitting: Internal Medicine

## 2017-12-04 VITALS — BP 130/66 | HR 72

## 2017-12-04 DIAGNOSIS — M545 Low back pain, unspecified: Secondary | ICD-10-CM

## 2017-12-04 NOTE — Progress Notes (Signed)
Subjective:    Patient ID: Sabrina Hall, female    DOB: 04-16-1923, 84 y.o.   MRN: [id number]  HPI  Asked to check resident in [address]. C/o low back pain, L>R. She describes the pain as sore and achy. The pain radiates into her left buttock. The pain seems worse with twisting, especially when trying to get in and out of bed. She denies numbness, tingling or weakness in her lower extremity. She denies issues with bowel or bladder. She did have a fall 3 weeks ago resulting in left side rib fractures. She reports that pain has improved. Xray of the lumbar spine at that time was normal. She is not walking as much due to the pain. She has been taking Tramadol with some relief.  Review of Systems      Past Medical History:  Diagnosis Date  . Arthritis   . Cataract   . Crohn's   . Deviated septum   . Eczema   . H/O seasonal allergies   . History of hemorrhoids   . History of shingles   . Hypertension   . Psoriasis   . Vitamin B12 deficiency     Current Outpatient Medications  Medication Sig Dispense Refill  . amLODipine (NORVASC) 5 MG tablet TAKE ONE TABLET BY MOUTH TWICE DAILY 60 tablet 3  . aspirin 81 MG tablet Take 81 mg by mouth daily.    . balsalazide (COLAZAL) 750 MG capsule Take 1,500 mg by mouth 2 (two) times daily.    . Cyanocobalamin (VITAMIN B-12) 500 MCG SUBL Place 500 mcg under the tongue daily.    . CycloSPORINE (RESTASIS OP) Apply to eye 2 (two) times daily as needed.     . fish oil-omega-3 fatty acids 1000 MG capsule Take 1 g by mouth 2 (two) times daily.     . hydrALAZINE (APRESOLINE) 25 MG tablet TAKE ONE TABLET BY MOUTH TWICE DAILY 60 tablet 3  . lactase (LACTAID) 3000 units tablet Take 6,000 Units by mouth 3 (three) times daily with meals.    . latanoprost (XALATAN) 0.005 % ophthalmic solution Place 1 drop into both eyes at bedtime.    Marland Kitchen losartan (COZAAR) 100 MG tablet Take 100 mg by mouth daily.    . magnesium oxide (MAG-OX) 400 MG tablet Take 400 mg by  mouth daily.    . Melatonin 3 MG TABS Take 1 tablet by mouth at bedtime.    . Multiple Vitamin (MULTIVITAMIN) tablet Take 1 tablet by mouth daily.      Vladimir Faster Glycol-Propyl Glycol (SYSTANE OP) Apply to eye 4 (four) times daily.    . traMADol (ULTRAM) 50 MG tablet Take 1 tablet by mouth every 8 (eight) hours as needed.    . diazepam (VALIUM) 5 MG tablet Take 2.5 mg by mouth every 12 (twelve) hours as needed.     . loperamide (IMODIUM) 2 MG capsule Take 2 mg by mouth 2 (two) times daily as needed.      No current facility-administered medications for this visit.     Allergies  Allergen Reactions  . Lactose   . Maxzide [Hydrochlorothiazide W-Triamterene]   . Nsaids   . Penicillins   . Sulfonamide Derivatives     Family History  Problem Relation Age of Onset  . Stroke Father   . Cancer Neg Hx   . Diabetes Neg Hx     Social History   Socioeconomic History  . Marital status: Widowed    Spouse name: Not  on file  . Number of children: 2  . Years of education: Not on file  . Highest education level: Not on file  Social Needs  . Financial resource strain: Not on file  . Food insecurity - worry: Not on file  . Food insecurity - inability: Not on file  . Transportation needs - medical: Not on file  . Transportation needs - non-medical: Not on file  Occupational History  . Occupation: Radio producer    Comment: retired  Tobacco Use  . Smoking status: Former Smoker    Packs/day: 0.50    Types: Cigarettes    Last attempt to quit: 09/09/1981    Years since quitting: 36.2  . Smokeless tobacco: Never Used  Substance and Sexual Activity  . Alcohol use: Yes    Comment: occassionally  . Drug use: No  . Sexual activity: Not on file  Other Topics Concern  . Not on file  Social History Narrative   Has living will    Daughter is health care POA   Has DNR order--this is confirmed at visit 12/07/15   No tube feeds if cognitively unaware     Constitutional: Denies fever,  malaise, fatigue, headache or abrupt weight changes.  Musculoskeletal: Pt reports left low back pain. Denies or joint pain and swelling.  Skin: Denies redness, rashes, lesions or ulcercations.   No other specific complaints in a complete review of systems (except as listed in HPI above).  Objective:   Physical Exam   BP 130/66   Pulse 72  Wt Readings from Last 3 Encounters:  11/19/17 126 lb 3.2 oz (57.2 kg)  08/12/17 128 lb (58.1 kg)  05/28/17 128 lb (58.1 kg)    General: Appears her stated age, well developed, well nourished in NAD. Skin: Warm, dry and intact. No bruising noted. Musculoskeletal: Unable to assess ROM. No bony tenderness noted over the lumbar spine. She has mild pain with palpation over the left SI joint. Gait is slow and steady with use of rolling walker. Neurological: Alert and oriented. Sensation intact to BLE.  BMET    Component Value Date/Time   NA 132 (L) 03/27/2017 0338   NA 135 (L) 10/07/2014 1142   K 3.3 (L) 03/27/2017 0338   K 3.5 10/07/2014 1142   CL 110 03/27/2017 0338   CL 96 (L) 10/07/2014 1142   CO2 19 (L) 03/27/2017 0338   CO2 31 10/07/2014 1142   GLUCOSE 102 (H) 03/27/2017 0338   GLUCOSE 252 (H) 10/07/2014 1142   BUN 10 03/27/2017 0338   BUN 16 10/07/2014 1142   CREATININE 0.56 03/27/2017 0338   CREATININE 0.96 10/07/2014 1142   CALCIUM 8.2 (L) 03/27/2017 0338   CALCIUM 8.8 10/07/2014 1142   GFRNONAA >60 03/27/2017 0338   GFRNONAA 58 (L) 10/07/2014 1142   GFRNONAA 59 (L) 05/25/2014 1136   GFRAA >60 03/27/2017 0338   GFRAA >60 10/07/2014 1142   GFRAA >60 05/25/2014 1136    Lipid Panel  No results found for: CHOL, TRIG, HDL, CHOLHDL, VLDL, LDLCALC  CBC    Component Value Date/Time   WBC 9.8 03/25/2017 0601   RBC 3.75 (L) 03/25/2017 0601   HGB 11.4 (L) 03/25/2017 0601   HGB 14.8 10/07/2014 1142   HCT 32.4 (L) 03/25/2017 0601   HCT 45.0 10/07/2014 1142   PLT 193 03/25/2017 0601   PLT 169 10/07/2014 1142   MCV 86.4 03/25/2017  0601   MCV 94 10/07/2014 1142   MCH 30.5 03/25/2017 0601  MCHC 35.2 03/25/2017 0601   RDW 13.2 03/25/2017 0601   RDW 13.1 10/07/2014 1142   LYMPHSABS 1.4 12/07/2015 1712   LYMPHSABS 0.8 (L) 10/07/2014 1142   MONOABS 0.7 12/07/2015 1712   MONOABS 0.5 10/07/2014 1142   EOSABS 0.3 12/07/2015 1712   EOSABS 0.1 10/07/2014 1142   BASOSABS 0.1 12/07/2015 1712   BASOSABS 0.0 10/07/2014 1142    Hgb A1C No results found for: HGBA1C        Assessment & Plan:   Acute Left Side Low Back Pain:  Likely muscular in nature She has a new assistive device on her bed to help her get in and out of bed. I wonder if she is not using this properly and that is what is causing strain on her back PT eval Continue Tramadol prn  Will reassess as needed Webb Silversmith, NP

## 2017-12-04 NOTE — Patient Instructions (Signed)

## 2017-12-04 NOTE — Progress Notes (Signed)
Subjective:    Patient ID: Sabrina Hall, female    DOB: Nov 17, 1922, 84 y.o.   MRN: [id number]  HPI  Asked to evaluate resident in [address] Lost footing, fell on left side  C/o pain in the left side of the ribs, pain with taking a deep breath. Pain with certain movements Xray of ribs shows 10th left rib fracture. Lumbar xray is normal. Tylenol not effective for pain, RN requests something stronger.  Review of Systems  Past Medical History:  Diagnosis Date  . Arthritis   . Cataract   . Crohn's   . Deviated septum   . Eczema   . H/O seasonal allergies   . History of hemorrhoids   . History of shingles   . Hypertension   . Psoriasis   . Vitamin B12 deficiency     Current Outpatient Medications  Medication Sig Dispense Refill  . amLODipine (NORVASC) 5 MG tablet TAKE ONE TABLET BY MOUTH TWICE DAILY 60 tablet 3  . aspirin 81 MG tablet Take 81 mg by mouth daily.    . balsalazide (COLAZAL) 750 MG capsule Take 1,500 mg by mouth 2 (two) times daily.    . Cyanocobalamin (VITAMIN B-12) 500 MCG SUBL Place 500 mcg under the tongue daily.    . CycloSPORINE (RESTASIS OP) Apply to eye 2 (two) times daily as needed.     . diazepam (VALIUM) 5 MG tablet Take 2.5 mg by mouth every 12 (twelve) hours as needed.     . fish oil-omega-3 fatty acids 1000 MG capsule Take 1 g by mouth 2 (two) times daily.     . hydrALAZINE (APRESOLINE) 25 MG tablet TAKE ONE TABLET BY MOUTH TWICE DAILY 60 tablet 3  . lactase (LACTAID) 3000 units tablet Take 6,000 Units by mouth 3 (three) times daily with meals.    . latanoprost (XALATAN) 0.005 % ophthalmic solution Place 1 drop into both eyes at bedtime.    Marland Kitchen loperamide (IMODIUM) 2 MG capsule Take 2 mg by mouth 2 (two) times daily as needed.     Marland Kitchen losartan (COZAAR) 100 MG tablet Take 100 mg by mouth daily.    . magnesium oxide (MAG-OX) 400 MG tablet Take 400 mg by mouth daily.    . Melatonin 3 MG TABS Take 1 tablet by mouth at bedtime.    . Multiple Vitamin  (MULTIVITAMIN) tablet Take 1 tablet by mouth daily.      Vladimir Faster Glycol-Propyl Glycol (SYSTANE OP) Apply to eye 4 (four) times daily.    . traMADol (ULTRAM) 50 MG tablet Take 1 tablet by mouth every 8 (eight) hours as needed.     No current facility-administered medications for this visit.     Allergies  Allergen Reactions  . Lactose   . Maxzide [Hydrochlorothiazide W-Triamterene]   . Nsaids   . Penicillins   . Sulfonamide Derivatives     Family History  Problem Relation Age of Onset  . Stroke Father   . Cancer Neg Hx   . Diabetes Neg Hx     Social History   Socioeconomic History  . Marital status: Widowed    Spouse name: Not on file  . Number of children: 2  . Years of education: Not on file  . Highest education level: Not on file  Social Needs  . Financial resource strain: Not on file  . Food insecurity - worry: Not on file  . Food insecurity - inability: Not on file  . Transportation needs -  medical: Not on file  . Transportation needs - non-medical: Not on file  Occupational History  . Occupation: Radio producer    Comment: retired  Tobacco Use  . Smoking status: Former Smoker    Packs/day: 0.50    Types: Cigarettes    Last attempt to quit: 09/09/1981    Years since quitting: 36.2  . Smokeless tobacco: Never Used  Substance and Sexual Activity  . Alcohol use: Yes    Comment: occassionally  . Drug use: No  . Sexual activity: Not on file  Other Topics Concern  . Not on file  Social History Narrative   Has living will    Daughter is health care POA   Has DNR order--this is confirmed at visit 12/07/15   No tube feeds if cognitively unaware     Constitutional: Denies fever, malaise, fatigue, headache or abrupt weight changes.  Gastrointestinal: Denies abdominal pain, bloating, constipation, diarrhea or blood in the stool.  GU: Denies urgency, frequency, pain with urination, burning sensation, blood in urine, odor or discharge. Musculoskeletal: Pt  reports left side rib pain. Denies decrease in range of motion, or joint  swelling.  Skin: Denies redness, rashes, lesions or ulcercations.    No other specific complaints in a complete review of systems (except as listed in HPI above).     Objective:   Physical Exam  BP 132/62   Pulse 72   Wt Readings from Last 3 Encounters:  11/19/17 126 lb 3.2 oz (57.2 kg)  08/12/17 128 lb (58.1 kg)  05/28/17 128 lb (58.1 kg)    General: Appears her stated age, in NAD. Skin: Bruising noted over left lateral rib. Musculoskeletal: Pain with palpation over the lateral ribs, left side.  BMET    Component Value Date/Time   NA 132 (L) 03/27/2017 0338   NA 135 (L) 10/07/2014 1142   K 3.3 (L) 03/27/2017 0338   K 3.5 10/07/2014 1142   CL 110 03/27/2017 0338   CL 96 (L) 10/07/2014 1142   CO2 19 (L) 03/27/2017 0338   CO2 31 10/07/2014 1142   GLUCOSE 102 (H) 03/27/2017 0338   GLUCOSE 252 (H) 10/07/2014 1142   BUN 10 03/27/2017 0338   BUN 16 10/07/2014 1142   CREATININE 0.56 03/27/2017 0338   CREATININE 0.96 10/07/2014 1142   CALCIUM 8.2 (L) 03/27/2017 0338   CALCIUM 8.8 10/07/2014 1142   GFRNONAA >60 03/27/2017 0338   GFRNONAA 58 (L) 10/07/2014 1142   GFRNONAA 59 (L) 05/25/2014 1136   GFRAA >60 03/27/2017 0338   GFRAA >60 10/07/2014 1142   GFRAA >60 05/25/2014 1136    Lipid Panel  No results found for: CHOL, TRIG, HDL, CHOLHDL, VLDL, LDLCALC  CBC    Component Value Date/Time   WBC 9.8 03/25/2017 0601   RBC 3.75 (L) 03/25/2017 0601   HGB 11.4 (L) 03/25/2017 0601   HGB 14.8 10/07/2014 1142   HCT 32.4 (L) 03/25/2017 0601   HCT 45.0 10/07/2014 1142   PLT 193 03/25/2017 0601   PLT 169 10/07/2014 1142   MCV 86.4 03/25/2017 0601   MCV 94 10/07/2014 1142   MCH 30.5 03/25/2017 0601   MCHC 35.2 03/25/2017 0601   RDW 13.2 03/25/2017 0601   RDW 13.1 10/07/2014 1142   LYMPHSABS 1.4 12/07/2015 1712   LYMPHSABS 0.8 (L) 10/07/2014 1142   MONOABS 0.7 12/07/2015 1712   MONOABS 0.5  10/07/2014 1142   EOSABS 0.3 12/07/2015 1712   EOSABS 0.1 10/07/2014 1142   BASOSABS 0.1 12/07/2015 1712  BASOSABS 0.0 10/07/2014 1142    Hgb A1C No results found for: HGBA1C          Assessment & Plan:   Left Side Rib Pain s/p Fall at [place]:  Xrays reviewed Start scheduled Tylenol for pain Tramadol as needed for severe pain A heating pad may be helpful  Will reassess as needed Webb Silversmith, NP

## 2017-12-04 NOTE — Patient Instructions (Signed)
Rib Fracture A rib fracture is a break or crack in one of the bones of the ribs. The ribs are like a cage that goes around your upper chest. A broken or cracked rib is often painful, but most do not cause other problems. Most rib fractures heal on their own in 1-3 months. Follow these instructions at home:  Avoid activities that cause pain to the injured area. Protect your injured area.  Slowly increase activity as told by your doctor.  Take medicine as told by your doctor.  Put ice on the injured area for the first 1-2 days after you have been treated or as told by your doctor. ? Put ice in a plastic bag. ? Place a towel between your skin and the bag. ? Leave the ice on for 15-20 minutes at a time, every 2 hours while you are awake.  Do deep breathing as told by your doctor. You may be told to: ? Take deep breaths many times a day. ? Cough many times a day while hugging a pillow. ? Use a device (incentive spirometer) to perform deep breathing many times a day.  Drink enough fluids to keep your pee (urine) clear or pale yellow.  Do not wear a rib belt or binder. These do not allow you to breathe deeply. Get help right away if:  You have a fever.  You have trouble breathing.  You cannot stop coughing.  You cough up thick or bloody spit (mucus).  You feel sick to your stomach (nauseous), throw up (vomit), or have belly (abdominal) pain.  Your pain gets worse and medicine does not help. This information is not intended to replace advice given to you by your health care provider. Make sure you discuss any questions you have with your health care provider. Document Released: 06/24/2008 Document Revised: 02/21/2016 Document Reviewed: 11/17/2012 Elsevier Interactive Patient Education  [employer].

## 2018-02-19 ENCOUNTER — Ambulatory Visit: Payer: Medicare Other | Admitting: Internal Medicine

## 2018-02-19 DIAGNOSIS — K5090 Crohn's disease, unspecified, without complications: Secondary | ICD-10-CM

## 2018-02-19 DIAGNOSIS — I10 Essential (primary) hypertension: Secondary | ICD-10-CM | POA: Diagnosis not present

## 2018-02-21 ENCOUNTER — Encounter: Payer: Self-pay | Admitting: Internal Medicine

## 2018-02-21 NOTE — Assessment & Plan Note (Signed)
Continue Balasalazide

## 2018-02-21 NOTE — Progress Notes (Signed)
Subjective:    Patient ID: Sabrina Hall, female    DOB: October 21, 1922, 84 y.o.   MRN: [id number]  HPI  Routine follow up for resident in [address] Reviewed with RN- no new concerns Resident denies complaints. She sleeps well with Melatonin. She walks with a rolling walker. Her appetite is good. Weight is stable. She continues to have intermittent loose stools. She denies urinary incontinence. She denies pain or reflex. She reports she chronically is SOB with exertion.  HTN: Controlled on Losartan, Hydralazine and Amlodipine.  Chron's: Controlled on Balsalazide. She follows with GI.  Review of Systems      Past Medical History:  Diagnosis Date  . Arthritis   . Cataract   . Crohn's   . Deviated septum   . Eczema   . H/O seasonal allergies   . History of hemorrhoids   . History of shingles   . Hypertension   . Psoriasis   . Vitamin B12 deficiency     Current Outpatient Medications  Medication Sig Dispense Refill  . amLODipine (NORVASC) 5 MG tablet TAKE ONE TABLET BY MOUTH TWICE DAILY 60 tablet 3  . aspirin 81 MG tablet Take 81 mg by mouth daily.    . balsalazide (COLAZAL) 750 MG capsule Take 1,500 mg by mouth 2 (two) times daily.    . Cyanocobalamin (VITAMIN B-12) 500 MCG SUBL Place 500 mcg under the tongue daily.    . CycloSPORINE (RESTASIS OP) Apply to eye 2 (two) times daily as needed.     . diazepam (VALIUM) 5 MG tablet Take 2.5 mg by mouth every 12 (twelve) hours as needed.     . fish oil-omega-3 fatty acids 1000 MG capsule Take 1 g by mouth 2 (two) times daily.     . hydrALAZINE (APRESOLINE) 25 MG tablet TAKE ONE TABLET BY MOUTH TWICE DAILY 60 tablet 3  . lactase (LACTAID) 3000 units tablet Take 6,000 Units by mouth 3 (three) times daily with meals.    . latanoprost (XALATAN) 0.005 % ophthalmic solution Place 1 drop into both eyes at bedtime.    Marland Kitchen loperamide (IMODIUM) 2 MG capsule Take 2 mg by mouth 2 (two) times daily as needed.     Marland Kitchen losartan (COZAAR) 100 MG  tablet Take 100 mg by mouth daily.    . magnesium oxide (MAG-OX) 400 MG tablet Take 400 mg by mouth daily.    . Melatonin 3 MG TABS Take 1 tablet by mouth at bedtime.    . Multiple Vitamin (MULTIVITAMIN) tablet Take 1 tablet by mouth daily.      Vladimir Faster Glycol-Propyl Glycol (SYSTANE OP) Apply to eye 4 (four) times daily.    . traMADol (ULTRAM) 50 MG tablet Take 1 tablet by mouth every 8 (eight) hours as needed.     No current facility-administered medications for this visit.     Allergies  Allergen Reactions  . Lactose   . Maxzide [Hydrochlorothiazide W-Triamterene]   . Nsaids   . Penicillins   . Sulfonamide Derivatives     Family History  Problem Relation Age of Onset  . Stroke Father   . Cancer Neg Hx   . Diabetes Neg Hx     Social History   Socioeconomic History  . Marital status: Widowed    Spouse name: Not on file  . Number of children: 2  . Years of education: Not on file  . Highest education level: Not on file  Occupational History  . 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 Financial resource strain: Not on file  . Food insecurity:    Worry: Not on file    Inability: Not on file  . Transportation needs:    Medical: Not on file    Non-medical: Not on file  Tobacco Use  . Smoking status: Former Smoker    Packs/day: 0.50    Types: Cigarettes    Last attempt to quit: 09/09/1981    Years since quitting: 36.4  . Smokeless tobacco: Never Used  Substance and Sexual Activity  . Alcohol use: Yes    Comment: occassionally  . Drug use: No  . Sexual activity: Not on file  Lifestyle  . Physical activity:    Days per week: Not on file    Minutes per session: Not on file  . Stress: Not on file  Relationships  . Social connections:    Talks on phone: Not on file    Gets together: Not on file    Attends religious service: Not on file    Active member of club or organization: Not on file    Attends meetings of clubs or organizations:  Not on file    Relationship status: Not on file  . Intimate partner violence:    Fear of current or ex partner: Not on file    Emotionally abused: Not on file    Physically abused: Not on file    Forced sexual activity: Not on file  Other Topics Concern  . Not on file  Social History Narrative   Has living will    Daughter is health care POA   Has DNR order--this is confirmed at visit 12/07/15   No tube feeds if cognitively unaware     Constitutional: Denies fever, malaise, fatigue, headache or abrupt weight changes.  HEENT: Denies eye pain, eye redness, ear pain, ringing in the ears, wax buildup, runny nose, nasal congestion, bloody nose, or sore throat. Respiratory: Pt reports mild shortness of breath. Denies difficulty breathing, cough or sputum production.   Cardiovascular: Denies chest pain, chest tightness, palpitations or swelling in the hands or feet.  Gastrointestinal: Pt reports diarrhea. Denies abdominal pain, bloating, constipation, or blood in the stool.  GU: Denies urgency, frequency, pain with urination, burning sensation, blood in urine, odor or discharge. Musculoskeletal: Denies decrease in range of motion, difficulty with gait, muscle pain or joint pain and swelling.  Skin: Denies redness, rashes, lesions or ulcercations.  Neurological: Denies dizziness, difficulty with memory, difficulty with speech or problems with balance and coordination.  Psych: Denies anxiety, depression, SI/HI.  No other specific complaints in a complete review of systems (except as listed in HPI above).  Objective:   Physical Exam    BP 138/65   Pulse (!) 57   Temp 98.1 F (36.7 C)   Resp 18   Wt 119 lb 6.4 oz (54.2 kg)   BMI 23.32 kg/m  Wt Readings from Last 3 Encounters:  02/21/18 119 lb 6.4 oz (54.2 kg)  11/19/17 126 lb 3.2 oz (57.2 kg)  08/12/17 128 lb (58.1 kg)    General: Appears her stated age, well developed, well nourished in NAD. Neck:  No adenopathy noted.    Cardiovascular: Normal rate and rhythm. Murmur noted. Pulmonary/Chest: Normal effort and positive vesicular breath sounds. No respiratory distress. No wheezes, rales or ronchi noted.  Abdomen: Soft and nontender. Normal bowel sounds. No distention or masses noted.  Musculoskeletal: Gait slow and steady.  Neurological: Alert and oriented.  Psychiatric: Mood and affect normal. Behavior is normal. Judgment and thought content normal.     BMET    Component Value Date/Time   NA 132 (L) 03/27/2017 0338   NA 135 (L) 10/07/2014 1142   K 3.3 (L) 03/27/2017 0338   K 3.5 10/07/2014 1142   CL 110 03/27/2017 0338   CL 96 (L) 10/07/2014 1142   CO2 19 (L) 03/27/2017 0338   CO2 31 10/07/2014 1142   GLUCOSE 102 (H) 03/27/2017 0338   GLUCOSE 252 (H) 10/07/2014 1142   BUN 10 03/27/2017 0338   BUN 16 10/07/2014 1142   CREATININE 0.56 03/27/2017 0338   CREATININE 0.96 10/07/2014 1142   CALCIUM 8.2 (L) 03/27/2017 0338   CALCIUM 8.8 10/07/2014 1142   GFRNONAA >60 03/27/2017 0338   GFRNONAA 58 (L) 10/07/2014 1142   GFRNONAA 59 (L) 05/25/2014 1136   GFRAA >60 03/27/2017 0338   GFRAA >60 10/07/2014 1142   GFRAA >60 05/25/2014 1136    Lipid Panel  No results found for: CHOL, TRIG, HDL, CHOLHDL, VLDL, LDLCALC  CBC    Component Value Date/Time   WBC 9.8 03/25/2017 0601   RBC 3.75 (L) 03/25/2017 0601   HGB 11.4 (L) 03/25/2017 0601   HGB 14.8 10/07/2014 1142   HCT 32.4 (L) 03/25/2017 0601   HCT 45.0 10/07/2014 1142   PLT 193 03/25/2017 0601   PLT 169 10/07/2014 1142   MCV 86.4 03/25/2017 0601   MCV 94 10/07/2014 1142   MCH 30.5 03/25/2017 0601   MCHC 35.2 03/25/2017 0601   RDW 13.2 03/25/2017 0601   RDW 13.1 10/07/2014 1142   LYMPHSABS 1.4 12/07/2015 1712   LYMPHSABS 0.8 (L) 10/07/2014 1142   MONOABS 0.7 12/07/2015 1712   MONOABS 0.5 10/07/2014 1142   EOSABS 0.3 12/07/2015 1712   EOSABS 0.1 10/07/2014 1142   BASOSABS 0.1 12/07/2015 1712   BASOSABS 0.0 10/07/2014 1142    Hgb  A1C No results found for: HGBA1C        Assessment & Plan:

## 2018-02-21 NOTE — Patient Instructions (Signed)

## 2018-02-21 NOTE — Assessment & Plan Note (Signed)
Controlled on Losartan, Hydralazine and Amlodipine

## 2018-05-27 ENCOUNTER — Ambulatory Visit: Payer: Medicare Other | Admitting: Internal Medicine

## 2018-05-27 ENCOUNTER — Encounter: Payer: Self-pay | Admitting: Internal Medicine

## 2018-05-27 DIAGNOSIS — F39 Unspecified mood [affective] disorder: Secondary | ICD-10-CM

## 2018-05-27 DIAGNOSIS — I10 Essential (primary) hypertension: Secondary | ICD-10-CM

## 2018-05-27 DIAGNOSIS — M159 Polyosteoarthritis, unspecified: Secondary | ICD-10-CM

## 2018-05-27 DIAGNOSIS — K5090 Crohn's disease, unspecified, without complications: Secondary | ICD-10-CM

## 2018-05-27 MED ORDER — SERTRALINE HCL 25 MG PO TABS: 25.0000 mg | ORAL_TABLET | Freq: Every day | ORAL | 11 refills | Status: DC

## 2018-05-27 NOTE — Progress Notes (Signed)
Subjective:    Patient ID: Sabrina Hall, female    DOB: 01/12/1923, 84 y.o.   MRN: [id number]  HPI Visit in [place] apartment for review of chronic health conditions Reviewed status with [name] RN Phone call with daughter yesterday  She has a preoccupation with dampness and moisture Is sure one of the fire sprinklers is wetting her closet--but nothing seen Daughter noted this preoccupation is long standing but seems worse lately Gets annoyed about this--doesn't think she is anxious Not otherwise unhappy--not depressed  Appetite is good Weight down slightly Still gets diarrhea at times---just uses the imodium before she is going out No abdominal pain or blood in stools  Satisfied with pain regimen Arthritis controlled with tylenol/tramadol  No chest pain No SOB No dizziness Walks with rollator most of the time  Current Outpatient Medications on File Prior to Visit  Medication Sig Dispense Refill  . acetaminophen (TYLENOL) 500 MG tablet Take 500 mg by mouth 2 (two) times daily.    Marland Kitchen amLODipine (NORVASC) 5 MG tablet TAKE ONE TABLET BY MOUTH TWICE DAILY 60 tablet 3  . aspirin 81 MG tablet Take 81 mg by mouth daily.    . balsalazide (COLAZAL) 750 MG capsule Take 1,500 mg by mouth 2 (two) times daily.    . Cyanocobalamin (VITAMIN B-12) 500 MCG SUBL Place 500 mcg under the tongue daily.    . CycloSPORINE (RESTASIS OP) Apply to eye 2 (two) times daily as needed.     . diazepam (VALIUM) 5 MG tablet Take 2.5 mg by mouth every 12 (twelve) hours as needed.     . fish oil-omega-3 fatty acids 1000 MG capsule Take 1 g by mouth 2 (two) times daily.     . hydrALAZINE (APRESOLINE) 25 MG tablet TAKE ONE TABLET BY MOUTH TWICE DAILY 60 tablet 3  . lactase (LACTAID) 3000 units tablet Take 6,000 Units by mouth 3 (three) times daily with meals.    . latanoprost (XALATAN) 0.005 % ophthalmic solution Place 1 drop into both eyes at bedtime.    Marland Kitchen loperamide (IMODIUM) 2 MG capsule Take 2 mg by mouth  2 (two) times daily as needed.     Marland Kitchen losartan (COZAAR) 100 MG tablet Take 100 mg by mouth daily.    . magnesium oxide (MAG-OX) 400 MG tablet Take 400 mg by mouth daily.    . Melatonin 3 MG TABS Take 1 tablet by mouth at bedtime.    . Multiple Vitamin (MULTIVITAMIN) tablet Take 1 tablet by mouth daily.      Vladimir Faster Glycol-Propyl Glycol (SYSTANE OP) Apply to eye 4 (four) times daily.    . traMADol (ULTRAM) 50 MG tablet Take 1 tablet by mouth every 8 (eight) hours as needed.     No current facility-administered medications on file prior to visit.     Allergies  Allergen Reactions  . Lactose   . Maxzide [Hydrochlorothiazide W-Triamterene]   . Nsaids   . Penicillins   . Sulfonamide Derivatives     Past Medical History:  Diagnosis Date  . Arthritis   . Cataract   . Crohn's   . Deviated septum   . Eczema   . H/O seasonal allergies   . History of hemorrhoids   . History of shingles   . Hypertension   . Psoriasis   . Vitamin B12 deficiency     Past Surgical History:  Procedure Laterality Date  . basel cell carcinoma    . CATARACT EXTRACTION  right eye  . CHOLECYSTECTOMY    . HYSTERECTOMY    . ILEOCECOSTOMY    . MOHS SURGERY    . NASAL SEPTUM SURGERY    . NECK SURGERY  2004   mass removed from cervical spine  . NOSE SURGERY     basal cell carcinoma of nose    Family History  Problem Relation Age of Onset  . Stroke Father   . Cancer Neg Hx   . Diabetes Neg Hx     Social History   Socioeconomic History  . Marital status: Widowed    Spouse name: Not on file  . Number of children: 2  . Years of education: Not on file  . Highest education level: Not on file  Occupational History  . 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 Financial resource strain: Not on file  . Food insecurity:    Worry: Not on file    Inability: Not on file  . Transportation needs:    Medical: Not on file    Non-medical: Not on file  Tobacco Use  . Smoking  status: Former Smoker    Packs/day: 0.50    Types: Cigarettes    Last attempt to quit: 09/09/1981    Years since quitting: 36.7  . Smokeless tobacco: Never Used  Substance and Sexual Activity  . Alcohol use: Yes    Comment: occassionally  . Drug use: No  . Sexual activity: Not on file  Lifestyle  . Physical activity:    Days per week: Not on file    Minutes per session: Not on file  . Stress: Not on file  Relationships  . Social connections:    Talks on phone: Not on file    Gets together: Not on file    Attends religious service: Not on file    Active member of club or organization: Not on file    Attends meetings of clubs or organizations: Not on file    Relationship status: Not on file  . Intimate partner violence:    Fear of current or ex partner: Not on file    Emotionally abused: Not on file    Physically abused: Not on file    Forced sexual activity: Not on file  Other Topics Concern  . Not on file  Social History Narrative   Has living will    Daughter is health care POA   Has DNR order--this is confirmed at visit 12/07/15   No tube feeds if cognitively unaware   Review of Systems  Sleeps well---on melatonin. Has diazepam for prn (don't think she needs it much) No skin problems Voids okay but some incontinence. Wears pad now     Objective:   Physical Exam  Constitutional: She appears well-developed.  Neck: No thyromegaly present.  Cardiovascular:  HA1/9 systolic murmur towards apex  Respiratory: Effort normal and breath sounds normal. No respiratory distress. She has no wheezes. She has no rales.  GI: Soft. There is no tenderness.  Musculoskeletal: She exhibits no edema or tenderness.  Lymphadenopathy:    She has no cervical adenopathy.  Skin: No rash noted. No erythema.  Psychiatric:  Calm and appropriate interaction Does believe the wetness problem (like the leaking sprinkler)           Assessment & Plan:

## 2018-05-27 NOTE — Assessment & Plan Note (Signed)
Mild chronic anxiety Also years of preoccupation with "wetness" has worsened into a mild delusional disorder It does cause her some distress Will start low dose sertraline and titrate up as needed

## 2018-05-27 NOTE — Assessment & Plan Note (Signed)
Seems to be quiet Still gets diarrhea at times Will have to monitor her weight

## 2018-05-27 NOTE — Assessment & Plan Note (Signed)
BP Readings from Last 3 Encounters:  05/27/18 134/68  02/21/18 138/65  12/04/17 130/66   Good control

## 2018-05-27 NOTE — Assessment & Plan Note (Signed)
Satisfied with current pain regimen

## 2018-06-06 ENCOUNTER — Other Ambulatory Visit: Payer: Self-pay

## 2018-06-06 ENCOUNTER — Inpatient Hospital Stay
Admission: EM | Admit: 2018-06-06 | Discharge: 2018-06-07 | DRG: 641 | Disposition: A | Discharge status: Skilled Nursing Facility | Payer: Medicare Other | Attending: Internal Medicine | Admitting: Internal Medicine

## 2018-06-06 DIAGNOSIS — R339 Retention of urine, unspecified: Secondary | ICD-10-CM

## 2018-06-06 DIAGNOSIS — Z8619 Personal history of other infectious and parasitic diseases: Secondary | ICD-10-CM | POA: Diagnosis not present

## 2018-06-06 DIAGNOSIS — Z87891 Personal history of nicotine dependence: Secondary | ICD-10-CM

## 2018-06-06 DIAGNOSIS — E861 Hypovolemia: Secondary | ICD-10-CM | POA: Diagnosis present

## 2018-06-06 DIAGNOSIS — Z85828 Personal history of other malignant neoplasm of skin: Secondary | ICD-10-CM

## 2018-06-06 DIAGNOSIS — Z882 Allergy status to sulfonamides status: Secondary | ICD-10-CM

## 2018-06-06 DIAGNOSIS — F419 Anxiety disorder, unspecified: Secondary | ICD-10-CM | POA: Diagnosis present

## 2018-06-06 DIAGNOSIS — Z91048 Other nonmedicinal substance allergy status: Secondary | ICD-10-CM | POA: Diagnosis not present

## 2018-06-06 DIAGNOSIS — Z880 Allergy status to penicillin: Secondary | ICD-10-CM

## 2018-06-06 DIAGNOSIS — Z886 Allergy status to analgesic agent status: Secondary | ICD-10-CM

## 2018-06-06 DIAGNOSIS — Z66 Do not resuscitate: Secondary | ICD-10-CM | POA: Diagnosis present

## 2018-06-06 DIAGNOSIS — I10 Essential (primary) hypertension: Secondary | ICD-10-CM | POA: Diagnosis present

## 2018-06-06 DIAGNOSIS — E878 Other disorders of electrolyte and fluid balance, not elsewhere classified: Secondary | ICD-10-CM | POA: Diagnosis present

## 2018-06-06 DIAGNOSIS — E739 Lactose intolerance, unspecified: Secondary | ICD-10-CM | POA: Diagnosis present

## 2018-06-06 DIAGNOSIS — M1990 Unspecified osteoarthritis, unspecified site: Secondary | ICD-10-CM | POA: Diagnosis present

## 2018-06-06 DIAGNOSIS — E871 Hypo-osmolality and hyponatremia: Secondary | ICD-10-CM | POA: Diagnosis present

## 2018-06-06 DIAGNOSIS — K5090 Crohn's disease, unspecified, without complications: Secondary | ICD-10-CM | POA: Diagnosis present

## 2018-06-06 DIAGNOSIS — E860 Dehydration: Secondary | ICD-10-CM | POA: Diagnosis present

## 2018-06-06 DIAGNOSIS — R338 Other retention of urine: Secondary | ICD-10-CM | POA: Diagnosis present

## 2018-06-06 LAB — BASIC METABOLIC PANEL
Anion gap: 9 (ref 5–15)
BUN: 13 mg/dL (ref 8–23)
CHLORIDE: 88 mmol/L — AB (ref 98–111)
CO2: 26 mmol/L (ref 22–32)
Calcium: 8.4 mg/dL — ABNORMAL LOW (ref 8.9–10.3)
Creatinine, Ser: 0.56 mg/dL (ref 0.44–1.00)
GFR calc Af Amer: >60 mL/min (ref >60)
GFR calc non Af Amer: >60 mL/min (ref >60)
Glucose, Bld: 105 mg/dL — ABNORMAL HIGH (ref 70–99)
POTASSIUM: 3.1 mmol/L — AB (ref 3.5–5.1)
Sodium: 123 mmol/L — ABNORMAL LOW (ref 135–145)

## 2018-06-06 LAB — URINALYSIS, COMPLETE (UACMP) WITH MICROSCOPIC
BACTERIA UA: NONE SEEN
BILIRUBIN URINE: NEGATIVE
Glucose, UA: NEGATIVE mg/dL
Hgb urine dipstick: NEGATIVE
KETONES UR: NEGATIVE mg/dL
Leukocytes, UA: NEGATIVE
NITRITE: NEGATIVE
Protein, ur: NEGATIVE mg/dL
Specific Gravity, Urine: 1.005 (ref 1.005–1.030)
Squamous Epithelial / LPF: NONE SEEN (ref 0–5)
pH: 6 (ref 5.0–8.0)

## 2018-06-06 LAB — CBC
HEMATOCRIT: 32.9 % — AB (ref 35.0–47.0)
Hemoglobin: 11.1 g/dL — ABNORMAL LOW (ref 12.0–16.0)
MCH: 27.4 pg (ref 26.0–34.0)
MCHC: 33.7 g/dL (ref 32.0–36.0)
MCV: 81.3 fL (ref 80.0–100.0)
Platelets: 285 10*3/uL (ref 150–440)
RBC: 4.05 MIL/uL (ref 3.80–5.20)
RDW: 13.8 % (ref 11.5–14.5)
WBC: 9.4 10*3/uL (ref 3.6–11.0)

## 2018-06-06 MED ORDER — SODIUM CHLORIDE 0.9 % IV SOLN
Freq: Once | INTRAVENOUS | Status: AC
  Administered 2018-06-06: 23:00:00 via INTRAVENOUS

## 2018-06-06 NOTE — ED Provider Notes (Signed)
[facility] Emergency Department Provider Note       Time seen: ----------------------------------------- 10:45 PM on 06/06/2018 -----------------------------------------   I have reviewed the triage vital signs and the nursing notes.  HISTORY   Chief Complaint Urinary Retention    HPI Sabrina Hall is a 84 y.o. female with a history of arthritis, eczema, hypertension, psoriasis who presents to the ED for difficulty urinating.  Patient reports she has not urinated much since yesterday.  Patient states she did notice that her abdomen was distended.  She denies fevers, chills, chest pain, shortness of breath, vomiting or diarrhea.  She has never required a Foley catheter.  Past Medical History:  Diagnosis Date  . Arthritis   . Cataract   . Crohn's   . Deviated septum   . Eczema   . H/O seasonal allergies   . History of hemorrhoids   . History of shingles   . Hypertension   . Psoriasis   . Vitamin B12 deficiency     Patient Active Problem List   Diagnosis Date Noted  . Vitamin B12 deficiency   . Mood disorder ([place]) 01/31/2016  . Sleep disorder 12/07/2015  . POLYP, COLON 11/22/2007  . Essential hypertension 11/22/2007  . Crohn's disease ([place]) 11/22/2007  . DIVERTICULITIS OF COLON 11/22/2007  . Generalized osteoarthritis of multiple sites 11/22/2007  . SKIN CANCER, HX OF 11/22/2007    Past Surgical History:  Procedure Laterality Date  . ABDOMINAL HYSTERECTOMY    . basel cell carcinoma    . CATARACT EXTRACTION     right eye  . CHOLECYSTECTOMY    . HYSTERECTOMY    . ILEOCECOSTOMY    . MOHS SURGERY    . NASAL SEPTUM SURGERY    . NECK SURGERY  2004   mass removed from cervical spine  . NOSE SURGERY     basal cell carcinoma of nose    Allergies Lactose; Maxzide [hydrochlorothiazide w-triamterene]; Nsaids; Penicillins; and Sulfonamide derivatives  Social History Social History   Tobacco Use  . Smoking status: Former Smoker     Packs/day: 0.50    Types: Cigarettes    Last attempt to quit: 09/09/1981    Years since quitting: 36.7  . Smokeless tobacco: Never Used  Substance Use Topics  . Alcohol use: Yes    Comment: occassionally  . Drug use: No   Review of Systems Constitutional: Negative for fever. Cardiovascular: Negative for chest pain. Respiratory: Negative for shortness of breath. Gastrointestinal: Negative for abdominal pain, vomiting and diarrhea. Genitourinary: Positive for urinary retention and hesitancy Musculoskeletal: Negative for back pain. Skin: Negative for rash. Neurological: Negative for headaches, focal weakness or numbness.  All systems negative/normal/unremarkable except as stated in the HPI  ____________________________________________   PHYSICAL EXAM:  VITAL SIGNS: ED Triage Vitals [06/06/18 2205]  Enc Vitals Group     BP (!) 186/66     Pulse Rate 77     Resp 16     Temp 97.9 F (36.6 C)     Temp src      SpO2 95 %     Weight 116 lb 13.5 oz (53 kg)     Height 5' 2"  (1.575 m)     Head Circumference      Peak Flow      Pain Score 0     Pain Loc      Pain Edu?      Excl. in [place]?    Constitutional: Alert and oriented. Well appearing and  in no distress. Eyes: Conjunctivae are normal. Normal extraocular movements. Cardiovascular: Normal rate, regular rhythm. No murmurs, rubs, or gallops. Respiratory: Normal respiratory effort without tachypnea nor retractions. Breath sounds are clear and equal bilaterally. No wheezes/rales/rhonchi. Gastrointestinal: Distended lower abdomen, possible distended bladder.  Normal bowel sounds. Musculoskeletal: Nontender with normal range of motion in extremities. No lower extremity tenderness nor edema. Neurologic:  Normal speech and language. No gross focal neurologic deficits are appreciated.  Skin:  Skin is warm, dry and intact. No rash noted. Psychiatric: Mood and affect are normal. Speech and behavior are normal.   ____________________________________________  ED COURSE:  As part of my medical decision making, I reviewed the following data within the [place] History obtained from family if available, nursing notes, old chart and ekg, as well as notes from prior ED visits. Patient presented for urinary retention, we will assess with labs and imaging as indicated at this time.  Bladder scan revealed greater than 500 cc of urine    Procedures ____________________________________________   LABS (pertinent positives/negatives)  Labs Reviewed  URINALYSIS, COMPLETE (UACMP) WITH MICROSCOPIC - Abnormal; Notable for the following components:      Result Value   Color, Urine STRAW (*)    APPearance CLEAR (*)    All other components within normal limits  BASIC METABOLIC PANEL - Abnormal; Notable for the following components:   Sodium 123 (*)    Potassium 3.1 (*)    Chloride 88 (*)    Glucose, Bld 105 (*)    Calcium 8.4 (*)    All other components within normal limits  CBC - Abnormal; Notable for the following components:   Hemoglobin 11.1 (*)    HCT 32.9 (*)    All other components within normal limits  URINE CULTURE   ___________________________________________  DIFFERENTIAL DIAGNOSIS   Acute urinary retention, UTI, pyelonephritis, gas pain, constipation, bowel obstruction  FINAL ASSESSMENT AND PLAN  Acute urinary retention, hyponatremia, hypochloremia   Plan: The patient had presented for urinary retention. Patient's labs do reveal significant hyponatremia and hypochloremia for which she was given IV fluids.  She does not have evidence of urinary tract infection.  I will discuss with the hospitalist for observation.   Laurence Aly, MD   Note: This note was generated in part or whole with voice recognition software. Voice recognition is usually quite accurate but there are transcription errors that can and very often do occur. I apologize for any  typographical errors that were not detected and corrected.     Earleen Newport, MD 06/06/18 [phone]

## 2018-06-06 NOTE — H&P (Signed)
[facility] [place] NAME: Sabrina Hall    MR#:  [id number]  DATE OF BIRTH:  1923/03/08  DATE OF ADMISSION:  06/06/2018  PRIMARY CARE PHYSICIAN: Venia Carbon, MD   REQUESTING/REFERRING PHYSICIAN: Jimmye Norman, MD  CHIEF COMPLAINT:   Chief Complaint  Patient presents with  . Urinary Retention    HISTORY OF PRESENT ILLNESS:  Sabrina Hall  is a 84 y.o. female who presents with chief complaint as above.  Patient was sent here from facility due to decreased urine output.  Here she was found to have significant urinary retention, with greater than 600 cc retained in her bladder.  She is also found to be hyponatremic with sodium in the 120s.  Recently she had a normal sodium level.  Hospitalist were called for admission  PAST MEDICAL HISTORY:   Past Medical History:  Diagnosis Date  . Arthritis   . Cataract   . Crohn's   . Deviated septum   . Eczema   . H/O seasonal allergies   . History of hemorrhoids   . History of shingles   . Hypertension   . Psoriasis   . Vitamin B12 deficiency      PAST SURGICAL HISTORY:   Past Surgical History:  Procedure Laterality Date  . ABDOMINAL HYSTERECTOMY    . basel cell carcinoma    . CATARACT EXTRACTION     right eye  . CHOLECYSTECTOMY    . HYSTERECTOMY    . ILEOCECOSTOMY    . MOHS SURGERY    . NASAL SEPTUM SURGERY    . NECK SURGERY  2004   mass removed from cervical spine  . NOSE SURGERY     basal cell carcinoma of nose     SOCIAL HISTORY:   Social History   Tobacco Use  . Smoking status: Former Smoker    Packs/day: 0.50    Types: Cigarettes    Last attempt to quit: 09/09/1981    Years since quitting: 36.7  . Smokeless tobacco: Never Used  Substance Use Topics  . Alcohol use: Yes    Comment: occassionally     FAMILY HISTORY:   Family History  Problem Relation Age of Onset  . Stroke Father   . Cancer Neg Hx   . Diabetes Neg Hx      DRUG  ALLERGIES:   Allergies  Allergen Reactions  . Lactose   . Maxzide [Hydrochlorothiazide W-Triamterene]   . Nsaids   . Penicillins   . Sulfonamide Derivatives     MEDICATIONS AT HOME:   Prior to Admission medications   Medication Sig Start Date End Date Taking? Authorizing Provider  acetaminophen (TYLENOL) 500 MG tablet Take 500 mg by mouth 2 (two) times daily.    [provider]  amLODipine (NORVASC) 5 MG tablet TAKE ONE TABLET BY MOUTH TWICE DAILY 08/27/15   Minna Merritts, MD  aspirin 81 MG tablet Take 81 mg by mouth daily.    [provider]  balsalazide (COLAZAL) 750 MG capsule Take 1,500 mg by mouth 2 (two) times daily.    [provider]  Cyanocobalamin (VITAMIN B-12) 500 MCG SUBL Place 500 mcg under the tongue daily.    [provider]  CycloSPORINE (RESTASIS OP) Apply to eye 2 (two) times daily as needed.     [provider]  diazepam (VALIUM) 5 MG tablet Take 2.5 mg by mouth every 12 (twelve) hours as needed.     [provider]  fish oil-omega-3 fatty acids 1000 MG capsule Take 1 g by mouth 2 (two) times daily.     [provider]  hydrALAZINE (APRESOLINE) 25 MG tablet TAKE ONE TABLET BY MOUTH TWICE DAILY 08/27/15   Minna Merritts, MD  lactase (LACTAID) 3000 units tablet Take 6,000 Units by mouth 3 (three) times daily with meals.    [provider]  latanoprost (XALATAN) 0.005 % ophthalmic solution Place 1 drop into both eyes at bedtime. 03/23/17   [provider]  loperamide (IMODIUM) 2 MG capsule Take 2 mg by mouth 2 (two) times daily as needed.     [provider]  losartan (COZAAR) 100 MG tablet Take 100 mg by mouth daily.    [provider]  magnesium oxide (MAG-OX) 400 MG tablet Take 400 mg by mouth daily.    [provider]  Melatonin 3 MG TABS Take 1 tablet by mouth at bedtime.    [provider]  Multiple Vitamin (MULTIVITAMIN) tablet Take 1 tablet  by mouth daily.      [provider]  Polyethyl Glycol-Propyl Glycol (SYSTANE OP) Apply to eye 4 (four) times daily.    [provider]  sertraline (ZOLOFT) 25 MG tablet Take 1 tablet (25 mg total) by mouth daily. 05/27/18   Venia Carbon, MD  traMADol (ULTRAM) 50 MG tablet Take 1 tablet by mouth every 8 (eight) hours as needed. 12/03/17   [provider]    REVIEW OF SYSTEMS:  Review of Systems  Constitutional: Negative for chills, fever, malaise/fatigue and weight loss.  HENT: Negative for ear pain, hearing loss and tinnitus.   Eyes: Negative for blurred vision, double vision, pain and redness.  Respiratory: Negative for cough, hemoptysis and shortness of breath.   Cardiovascular: Negative for chest pain, palpitations, orthopnea and leg swelling.  Gastrointestinal: Negative for abdominal pain, constipation, diarrhea, nausea and vomiting.  Genitourinary: Negative for dysuria, frequency and hematuria.       Urinary retention  Musculoskeletal: Negative for back pain, joint pain and neck pain.  Skin:       No acne, rash, or lesions  Neurological: Negative for dizziness, tremors, focal weakness and weakness.  Endo/Heme/Allergies: Negative for polydipsia. Does not bruise/bleed easily.  Psychiatric/Behavioral: Negative for depression. The patient is not nervous/anxious and does not have insomnia.      VITAL SIGNS:   Vitals:   06/06/18 2205 06/06/18 2230 06/06/18 2300  BP: (!) 186/66 (!) 159/71 (!) 167/61  Pulse: 77 70 82  Resp: 16    Temp: 97.9 F (36.6 C)    SpO2: 95% 94% 96%  Weight: 53 kg    Height: 5' 2"  (1.575 m)     Wt Readings from Last 3 Encounters:  06/06/18 53 kg  05/27/18 53.1 kg  02/21/18 54.2 kg    PHYSICAL EXAMINATION:  Physical Exam  Vitals reviewed. Constitutional: She is oriented to person, place, and time. She appears well-developed and well-nourished. No distress.  HENT:  Head: Normocephalic and atraumatic.  Mouth/Throat:  Oropharynx is clear and moist.  Eyes: Pupils are equal, round, and reactive to light. Conjunctivae and EOM are normal. No scleral icterus.  Neck: Normal range of motion. Neck supple. No JVD present. No thyromegaly present.  Cardiovascular: Normal rate, regular rhythm and intact distal pulses. Exam reveals no gallop and no friction rub.  No murmur heard. Respiratory: Effort normal and breath sounds normal. No respiratory distress. She has no wheezes. She has no rales.  GI:  Soft. Bowel sounds are normal. She exhibits no distension. There is no tenderness.  Musculoskeletal: Normal range of motion. She exhibits no edema.  No arthritis, no gout  Lymphadenopathy:    She has no cervical adenopathy.  Neurological: She is alert and oriented to person, place, and time. No cranial nerve deficit.  No dysarthria, no aphasia  Skin: Skin is warm and dry. No rash noted. No erythema.  Psychiatric: She has a normal mood and affect. Her behavior is normal. Judgment and thought content normal.    LABORATORY PANEL:   CBC Recent Labs  Lab 06/06/18 2234  WBC 9.4  HGB 11.1*  HCT 32.9*  PLT 285   ------------------------------------------------------------------------------------------------------------------  Chemistries  Recent Labs  Lab 06/06/18 2234  NA 123*  K 3.1*  CL 88*  CO2 26  GLUCOSE 105*  BUN 13  CREATININE 0.56  CALCIUM 8.4*   ------------------------------------------------------------------------------------------------------------------  Cardiac Enzymes No results for input(s): TROPONINI in the last 168 hours. ------------------------------------------------------------------------------------------------------------------  RADIOLOGY:  No results found.  EKG:   Orders placed or performed during the hospital encounter of 03/24/17  . ED EKG  . ED EKG  . EKG 12-Lead  . EKG 12-Lead    IMPRESSION AND PLAN:  Principal Problem:   Hyponatremia -potentially related to her  urinary retention, gentle IV hydration normal saline to start with tonight, monitor sodium closely for improvement Active Problems:   Acute urinary retention -Foley placed in the ED, monitor urine output for now, might consider urology consult if no improvement   Essential hypertension -continue home meds   Anxiety -home dose anxiolytic  Chart review performed and case discussed with ED provider. Labs, imaging and/or ECG reviewed by provider and discussed with patient/family. Management plans discussed with the patient and/or family.  DVT PROPHYLAXIS: SubQ lovenox   GI PROPHYLAXIS:  None  ADMISSION STATUS: Inpatient     CODE STATUS: DNR Code Status History    Date Active Date Inactive Code Status Order ID Comments User Context   03/25/2017 0305 03/27/2017 1830 DNR [id number]  Saundra Shelling, MD Inpatient   03/25/2017 0205 03/25/2017 0305 Full Code [id number]  Ramona, Ubaldo Glassing, DO ED    Questions for Most Recent Historical Code Status (Order [id number])    Question Answer Comment   In the event of cardiac or respiratory ARREST Do not call a "code blue"    In the event of cardiac or respiratory ARREST Do not perform Intubation, CPR, defibrillation or ACLS    In the event of cardiac or respiratory ARREST Use medication by any route, position, wound care, and other measures to relive pain and suffering. May use oxygen, suction and manual treatment of airway obstruction as needed for comfort.         Advance Directive Documentation     Most Recent Value  Type of Advance Directive  Out of facility DNR (pink MOST or yellow form)  Pre-existing out of facility DNR order (yellow form or pink MOST form)  -  "MOST" Form in Place?  -      TOTAL TIME TAKING CARE OF THIS PATIENT: 40 minutes.   Lemoyne Nestor [place] 06/06/2018, 11:44 PM  [facility]  Office  [phone]  CC: Primary care physician; Venia Carbon, MD  Note:  This document was prepared using Dragon voice  recognition software and may include unintentional dictation errors.

## 2018-06-06 NOTE — ED Notes (Signed)
Pt's granddaughter called to check on her; she spoke to pt first and then me-information regarding pt's visit given as allowed by pt

## 2018-06-06 NOTE — ED Triage Notes (Signed)
Pt arrives [facility] from [facility] assisted living. EMS reports "no urination since yesterday." reports that facility didn't measure output today but pt reports small amount of urination today. Pt reports her abd is swollen.

## 2018-06-07 ENCOUNTER — Other Ambulatory Visit: Payer: Self-pay

## 2018-06-07 LAB — CBC
HCT: 31.2 % — ABNORMAL LOW (ref 35.0–47.0)
HEMOGLOBIN: 10.6 g/dL — AB (ref 12.0–16.0)
MCH: 27.7 pg (ref 26.0–34.0)
MCHC: 34 g/dL (ref 32.0–36.0)
MCV: 81.5 fL (ref 80.0–100.0)
Platelets: 202 10*3/uL (ref 150–440)
RBC: 3.82 MIL/uL (ref 3.80–5.20)
RDW: 13.9 % (ref 11.5–14.5)
WBC: 8.4 10*3/uL (ref 3.6–11.0)

## 2018-06-07 LAB — BASIC METABOLIC PANEL
ANION GAP: 8 (ref 5–15)
BUN: 9 mg/dL (ref 8–23)
CHLORIDE: 92 mmol/L — AB (ref 98–111)
CO2: 29 mmol/L (ref 22–32)
Calcium: 8.1 mg/dL — ABNORMAL LOW (ref 8.9–10.3)
Creatinine, Ser: 0.62 mg/dL (ref 0.44–1.00)
GFR calc Af Amer: >60 mL/min (ref >60)
GLUCOSE: 103 mg/dL — AB (ref 70–99)
POTASSIUM: 3 mmol/L — AB (ref 3.5–5.1)
Sodium: 129 mmol/L — ABNORMAL LOW (ref 135–145)

## 2018-06-07 LAB — GLUCOSE, CAPILLARY
GLUCOSE-CAPILLARY: 96 mg/dL (ref 70–99)
Glucose-Capillary: 119 mg/dL — ABNORMAL HIGH (ref 70–99)

## 2018-06-07 LAB — MRSA PCR SCREENING: MRSA by PCR: NEGATIVE

## 2018-06-07 MED ORDER — BALSALAZIDE DISODIUM 750 MG PO CAPS
1500.0000 mg | ORAL_CAPSULE | Freq: Two times a day (BID) | ORAL | Status: DC
  Administered 2018-06-07 (×2): 1500 mg via ORAL
  Filled 2018-06-07 (×3): qty 2

## 2018-06-07 MED ORDER — SERTRALINE HCL 50 MG PO TABS
25.0000 mg | ORAL_TABLET | Freq: Every day | ORAL | Status: DC
  Administered 2018-06-07: 25 mg via ORAL
  Filled 2018-06-07: qty 1

## 2018-06-07 MED ORDER — LATANOPROST 0.005 % OP SOLN
1.0000 [drp] | Freq: Every day | OPHTHALMIC | Status: DC
  Administered 2018-06-07: 1 [drp] via OPHTHALMIC
  Filled 2018-06-07: qty 2.5

## 2018-06-07 MED ORDER — ASPIRIN 81 MG PO CHEW
81.0000 mg | CHEWABLE_TABLET | Freq: Every day | ORAL | Status: DC
  Administered 2018-06-07: 81 mg via ORAL
  Filled 2018-06-07: qty 1

## 2018-06-07 MED ORDER — ENOXAPARIN SODIUM 40 MG/0.4ML ~~LOC~~ SOLN: 40.0000 mg | SUBCUTANEOUS | Status: DC

## 2018-06-07 MED ORDER — DIAZEPAM 5 MG PO TABS: 2.5000 mg | ORAL_TABLET | Freq: Two times a day (BID) | ORAL | Status: DC | PRN

## 2018-06-07 MED ORDER — ACETAMINOPHEN 650 MG RE SUPP: 650.0000 mg | Freq: Four times a day (QID) | RECTAL | Status: DC | PRN

## 2018-06-07 MED ORDER — ACETAMINOPHEN 325 MG PO TABS: 650.0000 mg | ORAL_TABLET | Freq: Four times a day (QID) | ORAL | Status: DC | PRN

## 2018-06-07 MED ORDER — POTASSIUM CHLORIDE CRYS ER 20 MEQ PO TBCR
40.0000 meq | EXTENDED_RELEASE_TABLET | ORAL | Status: DC
  Administered 2018-06-07: 40 meq via ORAL
  Filled 2018-06-07: qty 2

## 2018-06-07 MED ORDER — AMLODIPINE BESYLATE 5 MG PO TABS
5.0000 mg | ORAL_TABLET | Freq: Two times a day (BID) | ORAL | Status: DC
  Administered 2018-06-07 (×2): 5 mg via ORAL
  Filled 2018-06-07 (×2): qty 1

## 2018-06-07 MED ORDER — ONDANSETRON HCL 4 MG PO TABS: 4.0000 mg | ORAL_TABLET | Freq: Four times a day (QID) | ORAL | Status: DC | PRN

## 2018-06-07 MED ORDER — SODIUM CHLORIDE 0.9 % IV SOLN
INTRAVENOUS | Status: AC
  Administered 2018-06-07: 07:00:00 via INTRAVENOUS

## 2018-06-07 MED ORDER — MELATONIN 3 MG PO TABS: 1.0000 | ORAL_TABLET | Freq: Every day | ORAL | Status: DC

## 2018-06-07 MED ORDER — MELATONIN 5 MG PO TABS
2.5000 mg | ORAL_TABLET | Freq: Every day | ORAL | Status: DC
  Administered 2018-06-07: 2.5 mg via ORAL
  Filled 2018-06-07 (×2): qty 0.5

## 2018-06-07 MED ORDER — LOSARTAN POTASSIUM 50 MG PO TABS
100.0000 mg | ORAL_TABLET | Freq: Every day | ORAL | Status: DC
  Administered 2018-06-07: 100 mg via ORAL
  Filled 2018-06-07: qty 2

## 2018-06-07 MED ORDER — ONDANSETRON HCL 4 MG/2ML IJ SOLN: 4.0000 mg | Freq: Four times a day (QID) | INTRAMUSCULAR | Status: DC | PRN

## 2018-06-07 MED ORDER — HYDRALAZINE HCL 25 MG PO TABS
25.0000 mg | ORAL_TABLET | Freq: Two times a day (BID) | ORAL | Status: DC
  Administered 2018-06-07: 25 mg via ORAL
  Filled 2018-06-07: qty 1

## 2018-06-07 NOTE — Discharge Summary (Signed)
[facility] NAME: Sabrina Hall    MR#:  [id number]  DATE OF BIRTH:  20-Feb-1923  DATE OF ADMISSION:  06/06/2018 ADMITTING PHYSICIAN: Lance Coon, MD  DATE OF DISCHARGE: 06/07/2018  PRIMARY CARE PHYSICIAN: Venia Carbon, MD   ADMISSION DIAGNOSIS:  Urinary retention [R33.9] Hyponatremia [E87.1]  DISCHARGE DIAGNOSIS:  Principal Problem:   Hyponatremia Active Problems:   Essential hypertension   Anxiety   Acute urinary retention   SECONDARY DIAGNOSIS:   Past Medical History:  Diagnosis Date  . Arthritis   . Cataract   . Crohn's   . Deviated septum   . Eczema   . H/O seasonal allergies   . History of hemorrhoids   . History of shingles   . Hypertension   . Psoriasis   . Vitamin B12 deficiency      ADMITTING HISTORY  HISTORY OF PRESENT ILLNESS:  Sabrina Hall  is a 84 y.o. female who presents with chief complaint as above.  Patient was sent here from facility due to decreased urine output.  Here she was found to have significant urinary retention, with greater than 600 cc retained in her bladder.  She is also found to be hyponatremic with sodium in the 120s.  Recently she had a normal sodium level.  Hospitalist were called for admission  HOSPITAL COURSE:   * Urinary retention *Hyponatremia, hypovolemic secondary to dehydration *Hypertension  Patient had a Foley catheter placed in the emergency room after bladder scan found 600 mL of urine.  Patient was admitted to the hospital after being found to have hypovolemic hyponatremia.  Today patient's Foley catheter has been removed for a voiding trial and she has emptied her bladder twice without any problem.  Patient has mild hyponatremia at baseline.  Sodium improved from 123 10/31/2027.  Patient will be discharged back to her assisted living facility.  Follow-up with primary care physician in 1 week.  Stable for discharge.  CONSULTS OBTAINED:    DRUG  ALLERGIES:   Allergies  Allergen Reactions  . Lactose Dermatitis  . Maxzide [Hydrochlorothiazide W-Triamterene] Other (See Comments)    causes electrolyte disturbance  . Nsaids Other (See Comments)    Reaction: unknown  . Sulfonamide Derivatives Other (See Comments)    Reaction: unknown  . Penicillins Rash and Other (See Comments)    Reaction pulled from Care Everywhere Has patient had a PCN reaction causing immediate rash, facial/tongue/throat swelling, SOB or lightheadedness with hypotension: Unknown Has patient had a PCN reaction causing severe rash involving mucus membranes or skin necrosis: Unknown Has patient had a PCN reaction that required hospitalization: Unknown Has patient had a PCN reaction occurring within the last 10 years: Unknown If all of the above answers are "NO", then may proceed with Cephalosporin use.     DISCHARGE MEDICATIONS:   Allergies as of 06/07/2018      Reactions   Lactose Dermatitis   Maxzide [hydrochlorothiazide W-triamterene] Other (See Comments)   causes electrolyte disturbance   Nsaids Other (See Comments)   Reaction: unknown   Sulfonamide Derivatives Other (See Comments)   Reaction: unknown   Penicillins Rash, Other (See Comments)   Reaction pulled from Care Everywhere Has patient had a PCN reaction causing immediate rash, facial/tongue/throat swelling, SOB or lightheadedness with hypotension: Unknown Has patient had a PCN reaction causing severe rash involving mucus membranes or skin necrosis: Unknown Has patient had a PCN reaction that required hospitalization: Unknown Has patient had a PCN reaction  occurring within the last 10 years: Unknown If all of the above answers are "NO", then may proceed with Cephalosporin use.      Medication List    TAKE these medications   acetaminophen 500 MG tablet Commonly known as:  TYLENOL Take 500 mg by mouth 2 (two) times daily.   acetaminophen 325 MG tablet Commonly known as:  TYLENOL Take  325-650 mg by mouth every 4 (four) hours as needed for fever (general discomfort).   alum & mag hydroxide-simeth 200-200-20 MG/5ML suspension Commonly known as:  MAALOX/MYLANTA Take 30 mLs by mouth every 4 (four) hours as needed for indigestion or flatulence (upset stomach).   amLODipine 5 MG tablet Commonly known as:  NORVASC TAKE ONE TABLET BY MOUTH TWICE DAILY   aspirin 81 MG tablet Take 81 mg by mouth daily.   balsalazide 750 MG capsule Commonly known as:  COLAZAL Take 1,500 mg by mouth 2 (two) times daily.   bismuth subsalicylate 921 JH/41DE suspension Commonly known as:  PEPTO BISMOL Take 10 mLs by mouth 3 (three) times daily as needed for diarrhea or loose stools.   carbamide peroxide 6.5 % OTIC solution Commonly known as:  DEBROX Place 5 drops into both ears daily as needed (cerumen impaction). Instill x5 days   cycloSPORINE 0.05 % ophthalmic emulsion Commonly known as:  RESTASIS Place 1 drop into both eyes 2 (two) times daily.   cycloSPORINE 0.05 % ophthalmic emulsion Commonly known as:  RESTASIS Place 1 drop into both eyes 2 (two) times daily as needed (dry eyes).   dextromethorphan-guaiFENesin 10-100 MG/5ML liquid Commonly known as:  ROBITUSSIN-DM Take 10 mLs by mouth every 4 (four) hours as needed for cough.   diazepam 5 MG tablet Commonly known as:  VALIUM Take 2.5 mg by mouth every 12 (twelve) hours as needed for anxiety.   dicyclomine 10 MG capsule Commonly known as:  BENTYL Take 10 mg by mouth 4 (four) times daily as needed (diarrhea/ abdominal pain).   diphenhydrAMINE 25 mg capsule Commonly known as:  BENADRYL Take 25 mg by mouth every 4 (four) hours as needed for itching or allergies.   fish oil-omega-3 fatty acids 1000 MG capsule Take 1 g by mouth 2 (two) times daily.   hydrALAZINE 25 MG tablet Commonly known as:  APRESOLINE TAKE ONE TABLET BY MOUTH TWICE DAILY   lactase 3000 units tablet Commonly known as:  LACTAID Take 6,000 Units by  mouth 3 (three) times daily with meals.   latanoprost 0.005 % ophthalmic solution Commonly known as:  XALATAN Place 1 drop into both eyes at bedtime.   loperamide 2 MG capsule Commonly known as:  IMODIUM Take 4 mg by mouth 2 (two) times daily as needed for diarrhea or loose stools.   losartan 100 MG tablet Commonly known as:  COZAAR Take 100 mg by mouth daily.   magnesium hydroxide 400 MG/5ML suspension Commonly known as:  MILK OF MAGNESIA Take 30 mLs by mouth daily as needed for mild constipation.   magnesium oxide 400 MG tablet Commonly known as:  MAG-OX Take 200 mg by mouth daily.   Melatonin 3 MG Tabs Take 1 tablet by mouth at bedtime.   multivitamin tablet Take 1 tablet by mouth daily.   nystatin powder Generic drug:  nystatin Apply 1 g topically 2 (two) times daily as needed (rash).   ondansetron 4 MG tablet Commonly known as:  ZOFRAN Take 4 mg by mouth every 8 (eight) hours as needed for nausea or vomiting.  sertraline 25 MG tablet Commonly known as:  ZOLOFT Take 1 tablet (25 mg total) by mouth daily.   traMADol 50 MG tablet Commonly known as:  ULTRAM Take 1 tablet by mouth every 8 (eight) hours as needed for moderate pain.   Vitamin B-12 500 MCG Subl Place 500 mcg under the tongue daily.       Today   VITAL SIGNS:  Blood pressure (!) 157/64, pulse 74, temperature 98.1 F (36.7 C), temperature source Oral, resp. rate 16, height 5' 1"  (1.549 m), weight 52.6 kg, SpO2 96 %.  I/O:    Intake/Output Summary (Last 24 hours) at 06/07/2018 1451 Last data filed at 06/07/2018 1020 Gross per 24 hour  Intake 0 ml  Output 2365 ml  Net -2365 ml    PHYSICAL EXAMINATION:  Physical Exam  GENERAL:  84 y.o.-year-old patient lying in the bed with no acute distress.  LUNGS: Normal breath sounds bilaterally, no wheezing, rales,rhonchi or crepitation. No use of accessory muscles of respiration.  CARDIOVASCULAR: S1, S2 normal. No murmurs, rubs, or gallops.   ABDOMEN: Soft, non-tender, non-distended. Bowel sounds present. No organomegaly or mass.  NEUROLOGIC: Moves all 4 extremities. PSYCHIATRIC: The patient is alert and awake  SKIN: No obvious rash, lesion, or ulcer.   DATA REVIEW:   CBC Recent Labs  Lab 06/07/18 0501  WBC 8.4  HGB 10.6*  HCT 31.2*  PLT 202    Chemistries  Recent Labs  Lab 06/07/18 0501  NA 129*  K 3.0*  CL 92*  CO2 29  GLUCOSE 103*  BUN 9  CREATININE 0.62  CALCIUM 8.1*    Cardiac Enzymes No results for input(s): TROPONINI in the last 168 hours.  Microbiology Results  Results for orders placed or performed during the hospital encounter of 06/06/18  MRSA PCR Screening     Status: None   Collection Time: 06/07/18  1:08 AM  Result Value Ref Range Status   MRSA by PCR NEGATIVE NEGATIVE Final    Comment:        The GeneXpert MRSA Assay (FDA approved for NASAL specimens only), is one component of a comprehensive MRSA colonization surveillance program. It is not intended to diagnose MRSA infection nor to guide or monitor treatment for MRSA infections. Performed at [facility], [address], [place], [place] [address]     RADIOLOGY:  No results found.  Follow up with PCP in 1 week.  Management plans discussed with the patient, family and they are in agreement.  CODE STATUS:     Code Status Orders  (From admission, onward)         Start     Ordered   06/07/18 0106  Do not attempt resuscitation (DNR)  Continuous    Question Answer Comment  In the event of cardiac or respiratory ARREST Do not call a "code blue"   In the event of cardiac or respiratory ARREST Do not perform Intubation, CPR, defibrillation or ACLS   In the event of cardiac or respiratory ARREST Use medication by any route, position, wound care, and other measures to relive pain and suffering. May use oxygen, suction and manual treatment of airway obstruction as needed for comfort.      06/07/18 0105         Code Status History    Date Active Date Inactive Code Status Order ID Comments User Context   03/25/2017 0305 03/27/2017 1830 DNR [id number]  Saundra Shelling, MD Inpatient   03/25/2017 0205 03/25/2017 4709 Full  Code [id number]  Harvie Bridge, DO ED    Advance Directive Documentation     Most Recent Value  Type of Advance Directive  Living will, Out of facility DNR (pink MOST or yellow form)  Pre-existing out of facility DNR order (yellow form or pink MOST form)  Yellow form placed in chart (order not valid for inpatient use)  "MOST" Form in Place?  -      TOTAL TIME TAKING CARE OF THIS PATIENT ON DAY OF DISCHARGE: more than 30 minutes.   Sabrina Hall M.D on 06/07/2018 at 2:51 PM  Between 7am to 6pm - Pager - [phone]  After 6pm go to [email or web address] - password [email or web address] [facility]  Office  [phone]  CC: Primary care physician; Venia Carbon, MD  Note: This dictation was prepared with Dragon dictation along with smaller phrase technology. Any transcriptional errors that result from this process are unintentional.

## 2018-06-07 NOTE — ED Notes (Signed)
Hospitalist to bedside at this time 

## 2018-06-07 NOTE — NC FL2 (Signed)
Sabrina Hall LEVEL OF CARE SCREENING TOOL     IDENTIFICATION  Patient Name: Sabrina Hall Birthdate: 09/28/1923 Sex: female Admission Date (Current Location): 06/06/2018  [facility] and [place] Number:  Engineering geologist and Address:  [facility], [address], [place], [place] [address]      Provider Number: [id number]  Attending Physician Name and Address:  Hillary Bow, MD  Relative Name and Phone Number:       Current Level of Care: Hospital Recommended Level of Care: [place] Prior Approval Number:    Date Approved/Denied:   PASRR Number: [id number])  Discharge Plan: Domiciliary (Rest home)    Current Diagnoses: Patient Active Problem List   Diagnosis Date Noted  . Anxiety 06/06/2018  . Hyponatremia 06/06/2018  . Acute urinary retention 06/06/2018  . Vitamin B12 deficiency   . Mood disorder ([place]) 01/31/2016  . Sleep disorder 12/07/2015  . POLYP, COLON 11/22/2007  . Essential hypertension 11/22/2007  . Crohn's disease ([place]) 11/22/2007  . DIVERTICULITIS OF COLON 11/22/2007  . Generalized osteoarthritis of multiple sites 11/22/2007  . SKIN CANCER, HX OF 11/22/2007    Orientation RESPIRATION BLADDER Height & Weight     Self, Time, Situation, Place  Normal Continent Weight: 115 lb 15.4 oz (52.6 kg) Height:  5' 1"  (154.9 cm)  BEHAVIORAL SYMPTOMS/MOOD NEUROLOGICAL BOWEL NUTRITION STATUS      Continent Diet(Diet: Heart Healthy )  AMBULATORY STATUS COMMUNICATION OF NEEDS Skin   Limited Assist Verbally Normal                       Personal Care Assistance Level of Assistance  Bathing, Feeding, Dressing Bathing Assistance: Limited assistance Feeding assistance: Independent Dressing Assistance: Limited assistance     Functional Limitations Info  Sight, Hearing, Speech Sight Info: Adequate Hearing Info: Adequate Speech Info: Adequate    SPECIAL CARE FACTORS FREQUENCY                        Contractures      Additional Factors Info  Code Status, Allergies Code Status Info: (DNR ) Allergies Info: (Lactose, Maxzide Hydrochlorothiazide W-triamterene, Nsaids, Sulfonamide Derivatives, Penicillins)          Discharge Medications: Please see discharge summary for a list of discharge medications. Medication List    TAKE these medications   acetaminophen 500 MG tablet Commonly known as:  TYLENOL Take 500 mg by mouth 2 (two) times daily.   acetaminophen 325 MG tablet Commonly known as:  TYLENOL Take 325-650 mg by mouth every 4 (four) hours as needed for fever (general discomfort).   alum & mag hydroxide-simeth 200-200-20 MG/5ML suspension Commonly known as:  MAALOX/MYLANTA Take 30 mLs by mouth every 4 (four) hours as needed for indigestion or flatulence (upset stomach).   amLODipine 5 MG tablet Commonly known as:  NORVASC TAKE ONE TABLET BY MOUTH TWICE DAILY   aspirin 81 MG tablet Take 81 mg by mouth daily.   balsalazide 750 MG capsule Commonly known as:  COLAZAL Take 1,500 mg by mouth 2 (two) times daily.   bismuth subsalicylate 967 RF/16BW suspension Commonly known as:  PEPTO BISMOL Take 10 mLs by mouth 3 (three) times daily as needed for diarrhea or loose stools.   carbamide peroxide 6.5 % OTIC solution Commonly known as:  DEBROX Place 5 drops into both ears daily as needed (cerumen impaction). Instill x5 days   cycloSPORINE 0.05 % ophthalmic emulsion Commonly  known as:  RESTASIS Place 1 drop into both eyes 2 (two) times daily.   cycloSPORINE 0.05 % ophthalmic emulsion Commonly known as:  RESTASIS Place 1 drop into both eyes 2 (two) times daily as needed (dry eyes).   dextromethorphan-guaiFENesin 10-100 MG/5ML liquid Commonly known as:  ROBITUSSIN-DM Take 10 mLs by mouth every 4 (four) hours as needed for cough.   diazepam 5 MG tablet Commonly known as:  VALIUM Take 2.5 mg by mouth every 12 (twelve) hours as needed for  anxiety.   dicyclomine 10 MG capsule Commonly known as:  BENTYL Take 10 mg by mouth 4 (four) times daily as needed (diarrhea/ abdominal pain).   diphenhydrAMINE 25 mg capsule Commonly known as:  BENADRYL Take 25 mg by mouth every 4 (four) hours as needed for itching or allergies.   fish oil-omega-3 fatty acids 1000 MG capsule Take 1 g by mouth 2 (two) times daily.   hydrALAZINE 25 MG tablet Commonly known as:  APRESOLINE TAKE ONE TABLET BY MOUTH TWICE DAILY   lactase 3000 units tablet Commonly known as:  LACTAID Take 6,000 Units by mouth 3 (three) times daily with meals.   latanoprost 0.005 % ophthalmic solution Commonly known as:  XALATAN Place 1 drop into both eyes at bedtime.   loperamide 2 MG capsule Commonly known as:  IMODIUM Take 4 mg by mouth 2 (two) times daily as needed for diarrhea or loose stools.   losartan 100 MG tablet Commonly known as:  COZAAR Take 100 mg by mouth daily.   magnesium hydroxide 400 MG/5ML suspension Commonly known as:  MILK OF MAGNESIA Take 30 mLs by mouth daily as needed for mild constipation.   magnesium oxide 400 MG tablet Commonly known as:  MAG-OX Take 200 mg by mouth daily.   Melatonin 3 MG Tabs Take 1 tablet by mouth at bedtime.   multivitamin tablet Take 1 tablet by mouth daily.   nystatin powder Generic drug:  nystatin Apply 1 g topically 2 (two) times daily as needed (rash).   ondansetron 4 MG tablet Commonly known as:  ZOFRAN Take 4 mg by mouth every 8 (eight) hours as needed for nausea or vomiting.   sertraline 25 MG tablet Commonly known as:  ZOLOFT Take 1 tablet (25 mg total) by mouth daily.   traMADol 50 MG tablet Commonly known as:  ULTRAM Take 1 tablet by mouth every 8 (eight) hours as needed for moderate pain.   Vitamin B-12 500 MCG Subl Place 500 mcg under the tongue daily.     Relevant Imaging Results: Relevant Lab Results: Additional Information (SSN: [id number])  Sabrina Hall,  Veronia Beets, LCSW

## 2018-06-07 NOTE — Clinical Social Work Note (Signed)
Clinical Social Work Assessment  Patient Details  Name: Sabrina Hall MRN: [id number] Date of Birth: 1923/02/01  Date of referral:  06/07/18               Reason for consult:  Other (Comment Required)(From [facility] )                Permission sought to share information with:  Chartered certified accountant granted to share information::  Yes, Verbal Permission Granted  Name::      [facility]   Agency::     Relationship::     Contact Information:     Housing/Transportation Living arrangements for the past 2 months:  [place] of Information:  Patient, Facility Patient Interpreter Needed:  None Criminal Activity/Legal Involvement Pertinent to Current Situation/Hospitalization:  No - Comment as needed Significant Relationships:  Other Family Members Lives with:  Facility Resident Do you feel safe going back to the place where you live?  Yes Need for family participation in patient care:  Yes (Comment)  Care giving concerns:  Patient is from [facility].    Social Worker assessment / plan:  Per MD patient is medically stable for D/C back to [facility] today. Patient has been at [facility] for less then 24 hours and will return to ALF without a foley. Per [facility]F nurse patient can return today and they will provide transport. CSW sent D/C summary and FL2 to [place]. CSW met with patient and made her aware of above. Patient stated that she wants to go to the healthcare SNF building at [facility] because that is what she did last time when she left [facility]. CSW explained that [facility] is on the way to pick her up now and can make those arrangements from [facility] if needed. CSW also explained that patient has not had a long extensive hospital stay and she may want to save her rehab days. Patient agreed with CSW and is agreeable to D/C back to [facility] today. [name] is aware of above. CSW left patient's granddaughter  [name] a Advertising account executive. RN aware of above. Please reconsult if future social work needs arise. CSW signing off.    Employment status:  Disabled (Comment on whether or not currently receiving Disability), Retired Nurse, adult PT Recommendations:  Not assessed at this time Information / Referral to community resources:  Other (Comment Required)(Patient will return to [facility] )  Patient/Family's Response to care:  Patient is agreeable to D/C back to [facility] today.   Patient/Family's Understanding of and Emotional Response to Diagnosis, Current Treatment, and Prognosis:  Patient was pleasant and thanked CSW for assistance.   Emotional Assessment Appearance:  Appears stated age Attitude/Demeanor/Rapport:    Affect (typically observed):  Accepting, Adaptable, Pleasant Orientation:  Oriented to Self, Oriented to Place, Oriented to  Time, Fluctuating Orientation (Suspected and/or reported Sundowners) Alcohol / Substance use:  Not Applicable Psych involvement (Current and /or in the community):  No (Comment)  Discharge Needs  Concerns to be addressed:  No discharge needs identified Readmission within the last 30 days:  No Current discharge risk:  None Barriers to Discharge:  No Barriers Identified   Leemon Ayala, Veronia Beets, LCSW 06/07/2018, 4:24 PM

## 2018-06-08 ENCOUNTER — Other Ambulatory Visit: Payer: Self-pay

## 2018-06-08 ENCOUNTER — Observation Stay
Admission: EM | Admit: 2018-06-08 | Discharge: 2018-06-12 | Disposition: A | Discharge status: Skilled Nursing Facility | Payer: Medicare Other | Attending: Internal Medicine | Admitting: Internal Medicine

## 2018-06-08 ENCOUNTER — Emergency Department: Payer: Medicare Other

## 2018-06-08 ENCOUNTER — Encounter: Payer: Self-pay | Admitting: *Deleted

## 2018-06-08 DIAGNOSIS — Z87891 Personal history of nicotine dependence: Secondary | ICD-10-CM | POA: Insufficient documentation

## 2018-06-08 DIAGNOSIS — Z79899 Other long term (current) drug therapy: Secondary | ICD-10-CM | POA: Diagnosis not present

## 2018-06-08 DIAGNOSIS — S42202A Unspecified fracture of upper end of left humerus, initial encounter for closed fracture: Secondary | ICD-10-CM | POA: Diagnosis present

## 2018-06-08 DIAGNOSIS — W0110XA Fall on same level from slipping, tripping and stumbling with subsequent striking against unspecified object, initial encounter: Secondary | ICD-10-CM | POA: Diagnosis not present

## 2018-06-08 DIAGNOSIS — Z7982 Long term (current) use of aspirin: Secondary | ICD-10-CM | POA: Diagnosis not present

## 2018-06-08 DIAGNOSIS — M1990 Unspecified osteoarthritis, unspecified site: Secondary | ICD-10-CM | POA: Diagnosis not present

## 2018-06-08 DIAGNOSIS — E876 Hypokalemia: Secondary | ICD-10-CM | POA: Diagnosis not present

## 2018-06-08 DIAGNOSIS — K5090 Crohn's disease, unspecified, without complications: Secondary | ICD-10-CM | POA: Diagnosis not present

## 2018-06-08 DIAGNOSIS — E871 Hypo-osmolality and hyponatremia: Secondary | ICD-10-CM | POA: Diagnosis not present

## 2018-06-08 DIAGNOSIS — Z880 Allergy status to penicillin: Secondary | ICD-10-CM | POA: Insufficient documentation

## 2018-06-08 DIAGNOSIS — Z886 Allergy status to analgesic agent status: Secondary | ICD-10-CM | POA: Diagnosis not present

## 2018-06-08 DIAGNOSIS — S22070A Wedge compression fracture of T9-T10 vertebra, initial encounter for closed fracture: Secondary | ICD-10-CM | POA: Insufficient documentation

## 2018-06-08 DIAGNOSIS — Z66 Do not resuscitate: Secondary | ICD-10-CM | POA: Diagnosis not present

## 2018-06-08 DIAGNOSIS — I10 Essential (primary) hypertension: Secondary | ICD-10-CM | POA: Diagnosis not present

## 2018-06-08 DIAGNOSIS — S42309A Unspecified fracture of shaft of humerus, unspecified arm, initial encounter for closed fracture: Secondary | ICD-10-CM | POA: Diagnosis present

## 2018-06-08 DIAGNOSIS — Z882 Allergy status to sulfonamides status: Secondary | ICD-10-CM | POA: Diagnosis not present

## 2018-06-08 DIAGNOSIS — W182XXA Fall in (into) shower or empty bathtub, initial encounter: Secondary | ICD-10-CM | POA: Diagnosis not present

## 2018-06-08 DIAGNOSIS — S098XXA Other specified injuries of head, initial encounter: Secondary | ICD-10-CM | POA: Insufficient documentation

## 2018-06-08 DIAGNOSIS — R2689 Other abnormalities of gait and mobility: Secondary | ICD-10-CM | POA: Diagnosis not present

## 2018-06-08 LAB — URINE CULTURE
CULTURE: NO GROWTH
Special Requests: NORMAL

## 2018-06-08 MED ORDER — OXYCODONE-ACETAMINOPHEN 5-325 MG PO TABS
1.0000 | ORAL_TABLET | Freq: Once | ORAL | Status: AC
  Administered 2018-06-09: 1 via ORAL
  Filled 2018-06-08: qty 1

## 2018-06-08 NOTE — ED Provider Notes (Addendum)
[facility] Emergency Department Provider Note    First MD Initiated Contact with Patient 06/08/18 2338     (approximate)  I have reviewed the triage vital signs and the nursing notes.   HISTORY  Chief Complaint Fall    HPI Sabrina Hall is a 84 y.o. female with below list of chronic medical conditions presents to the emergency department with history of accidental slip and fall in the shower tonight with resultant occipital head injury and left shoulder pain.  Patient also admits to posterior neck discomfort.  Patient denies any loss of consciousness.  Patient states current pain score is 6 out of 10.   Past Medical History:  Diagnosis Date  . Arthritis   . Cataract   . Crohn's   . Deviated septum   . Eczema   . H/O seasonal allergies   . History of hemorrhoids   . History of shingles   . Hypertension   . Psoriasis   . Vitamin B12 deficiency     Patient Active Problem List   Diagnosis Date Noted  . Humerus fracture 06/09/2018  . Anxiety 06/06/2018  . Hyponatremia 06/06/2018  . Acute urinary retention 06/06/2018  . Vitamin B12 deficiency   . Mood disorder ([place]) 01/31/2016  . Sleep disorder 12/07/2015  . POLYP, COLON 11/22/2007  . Essential hypertension 11/22/2007  . Crohn's disease ([place]) 11/22/2007  . DIVERTICULITIS OF COLON 11/22/2007  . Generalized osteoarthritis of multiple sites 11/22/2007  . SKIN CANCER, HX OF 11/22/2007    Past Surgical History:  Procedure Laterality Date  . ABDOMINAL HYSTERECTOMY    . basel cell carcinoma    . CATARACT EXTRACTION     right eye  . CHOLECYSTECTOMY    . HYSTERECTOMY    . ILEOCECOSTOMY    . MOHS SURGERY    . NASAL SEPTUM SURGERY    . NECK SURGERY  2004   mass removed from cervical spine  . NOSE SURGERY     basal cell carcinoma of nose    Prior to Admission medications   Medication Sig Start Date End Date Taking? Authorizing Provider  acetaminophen (TYLENOL) 325 MG tablet Take  325-650 mg by mouth every 4 (four) hours as needed for fever (general discomfort).   Yes [provider]  acetaminophen (TYLENOL) 500 MG tablet Take 500 mg by mouth 2 (two) times daily.   Yes [provider]  alum & mag hydroxide-simeth (MAALOX/MYLANTA) 200-200-20 MG/5ML suspension Take 30 mLs by mouth every 4 (four) hours as needed for indigestion or flatulence (upset stomach).   Yes [provider]  amLODipine (NORVASC) 5 MG tablet TAKE ONE TABLET BY MOUTH TWICE DAILY 08/27/15  Yes Gollan, Kathlene November, MD  aspirin 81 MG tablet Take 81 mg by mouth daily.   Yes [provider]  balsalazide (COLAZAL) 750 MG capsule Take 1,500 mg by mouth 2 (two) times daily.   Yes [provider]  bismuth subsalicylate (PEPTO BISMOL) 262 MG/15ML suspension Take 10 mLs by mouth 3 (three) times daily as needed for diarrhea or loose stools.   Yes [provider]  carbamide peroxide (DEBROX) 6.5 % OTIC solution Place 5 drops into both ears daily as needed (cerumen impaction). Instill x5 days   Yes [provider]  Cyanocobalamin (VITAMIN B-12) 500 MCG SUBL Place 500 mcg under the tongue daily.   Yes [provider]  cycloSPORINE (RESTASIS) 0.05 % ophthalmic emulsion Place 1 drop into both eyes 2 (two) times daily.  Yes [provider]  cycloSPORINE (RESTASIS) 0.05 % ophthalmic emulsion Place 1 drop into both eyes 2 (two) times daily as needed (dry eyes).   Yes [provider]  dextromethorphan-guaiFENesin (ROBITUSSIN-DM) 10-100 MG/5ML liquid Take 10 mLs by mouth every 4 (four) hours as needed for cough.   Yes [provider]  diazepam (VALIUM) 5 MG tablet Take 2.5 mg by mouth every 12 (twelve) hours as needed for anxiety.    Yes [provider]  dicyclomine (BENTYL) 10 MG capsule Take 10 mg by mouth 4 (four) times daily as needed (diarrhea/ abdominal pain).   Yes [provider]  diphenhydrAMINE (BENADRYL) 25  mg capsule Take 25 mg by mouth every 4 (four) hours as needed for itching or allergies.   Yes [provider]  fish oil-omega-3 fatty acids 1000 MG capsule Take 1 g by mouth 2 (two) times daily.    Yes [provider]  hydrALAZINE (APRESOLINE) 25 MG tablet TAKE ONE TABLET BY MOUTH TWICE DAILY 08/27/15  Yes Gollan, Kathlene November, MD  lactase (LACTAID) 3000 units tablet Take 6,000 Units by mouth 3 (three) times daily with meals.   Yes [provider]  latanoprost (XALATAN) 0.005 % ophthalmic solution Place 1 drop into both eyes at bedtime. 03/23/17  Yes [provider]  loperamide (IMODIUM) 2 MG capsule Take 4 mg by mouth 2 (two) times daily as needed for diarrhea or loose stools.    Yes [provider]  losartan (COZAAR) 100 MG tablet Take 100 mg by mouth daily.   Yes [provider]  magnesium hydroxide (MILK OF MAGNESIA) 400 MG/5ML suspension Take 30 mLs by mouth daily as needed for mild constipation.   Yes [provider]  magnesium oxide (MAG-OX) 400 MG tablet Take 200 mg by mouth daily.    Yes [provider]  Melatonin 3 MG TABS Take 1 tablet by mouth at bedtime.   Yes [provider]  Multiple Vitamin (MULTIVITAMIN) tablet Take 1 tablet by mouth daily.     Yes [provider]  nystatin (NYSTATIN) powder Apply 1 g topically 2 (two) times daily as needed (rash).   Yes [provider]  ondansetron (ZOFRAN) 4 MG tablet Take 4 mg by mouth every 8 (eight) hours as needed for nausea or vomiting.   Yes [provider]  sertraline (ZOLOFT) 25 MG tablet Take 1 tablet (25 mg total) by mouth daily. 05/27/18  Yes Venia Carbon, MD  traMADol (ULTRAM) 50 MG tablet Take 1 tablet by mouth every 8 (eight) hours as needed for moderate pain.  12/03/17  Yes [provider]    Allergies Lactose; Maxzide [hydrochlorothiazide w-triamterene]; Nsaids; Sulfonamide derivatives; and Penicillins  Family  History  Problem Relation Age of Onset  . Stroke Father   . Cancer Neg Hx   . Diabetes Neg Hx     Social History Social History   Tobacco Use  . Smoking status: Former Smoker    Packs/day: 0.50    Types: Cigarettes    Last attempt to quit: 09/09/1981    Years since quitting: 36.7  . Smokeless tobacco: Never Used  Substance Use Topics  . Alcohol use: Yes    Comment: occassionally  . Drug use: No    Review of Systems Constitutional: No fever/chills Eyes: No visual changes. ENT: No sore throat. Cardiovascular: Denies chest pain. Respiratory: Denies shortness of breath. Gastrointestinal: No abdominal pain.  No nausea, no vomiting.  No diarrhea.  No constipation. Genitourinary:  Negative for dysuria. Musculoskeletal: Negative for neck pain.  Positive for back pain. Positive for left shoulder pain Integumentary: Negative for rash. Neurological: Negative for headaches, focal weakness or numbness.  ____________________________________________   PHYSICAL EXAM:  VITAL SIGNS: ED Triage Vitals  Enc Vitals Group     BP 06/08/18 2330 127/62     Pulse Rate 06/08/18 2330 (!) 58     Resp 06/08/18 2330 16     Temp 06/08/18 2330 98.1 F (36.7 C)     Temp Source 06/08/18 2330 Oral     SpO2 06/08/18 2330 100 %     Weight 06/08/18 2332 53 kg (116 lb 13.5 oz)     Height 06/08/18 2332 1.549 m (5' 1" )     Head Circumference --      Peak Flow --      Pain Score 06/08/18 2331 6     Pain Loc --      Pain Edu? --      Excl. in [place]? --     Constitutional: Alert and oriented. Well appearing and in no acute distress. Eyes: Conjunctivae are normal. PERRL. EOMI. Head: Atraumatic. Mouth/Throat: Mucous membranes are moist. Oropharynx non-erythematous. Neck: No stridor.  No meningeal signs.  C3-5 pain with palpation Cardiovascular: Normal rate, regular rhythm. Good peripheral circulation. Grossly normal heart sounds. Respiratory: Normal respiratory effort.  No retractions. Lungs  CTAB. Gastrointestinal: Soft and nontender. No distention.  Musculoskeletal: Pain with left shoulder palpation.  T9-T11 pain with palpation step-off noted at T10 Neurologic:  Normal speech and language. No gross focal neurologic deficits are appreciated.  Skin:  Skin is warm, dry and intact. No rash noted. Psychiatric: Mood and affect are normal. Speech and behavior are normal.  ____________________________________________   LABS (all labs ordered are listed, but only abnormal results are displayed)  Labs Reviewed  CBC - Abnormal; Notable for the following components:      Result Value   WBC 11.2 (*)    Hemoglobin 10.5 (*)    HCT 31.2 (*)    All other components within normal limits  COMPREHENSIVE METABOLIC PANEL - Abnormal; Notable for the following components:   Sodium 123 (*)    Potassium 2.7 (*)    Chloride 87 (*)    Glucose, Bld 129 (*)    Calcium 8.5 (*)    All other components within normal limits   ____________________________________________  EKG  ED ECG REPORT I, [place] N BROWN, the attending physician, personally viewed and interpreted this ECG.   Date: 06/09/2018  EKG Time: 1:31 PM  Rate: 54  Rhythm: Sinus Bradycardia  Axis: Normal  Intervals:Normal  ST&T Change: None  ____________________________________________  RADIOLOGY I, [place] N BROWN, personally viewed and evaluated these images (plain radiographs) as part of my medical decision making, as well as reviewing the written report by the radiologist.  ED MD interpretation: Superior endplate of Z61 compression fracture with approximate 50% anterior loss of height.  Displaced fracture of the surgical neck of the left humerus.  Official radiology report(s): Dg Thoracic Spine 2 View  Result Date: 06/09/2018 CLINICAL DATA:  Found on floor in shower, fall, hit back of head and LEFT shoulder, LEFT shoulder pain EXAM: THORACIC SPINE 2 VIEWS COMPARISON:  None Correlation: Abdominal radiographs 10/07/2014  FINDINGS: Osseous demineralization. 13 pairs of ribs. Superior endplate compression fracture of a lower thoracic vertebra, likely T10, with 50% anterior height loss; this is not identified on prior abdominal radiographs. No additional fracture, dislocation, or bone destruction. IMPRESSION: Superior  endplate compression fracture of approximately T10 vertebral body with 50% anterior height loss. Electronically Signed   By: Lavonia Dana M.D.   On: 06/09/2018 01:00   Ct Head Wo Contrast  Result Date: 06/09/2018 CLINICAL DATA:  Unwitnessed fall in the shower. Struck back of head. EXAM: CT HEAD WITHOUT CONTRAST CT CERVICAL SPINE WITHOUT CONTRAST TECHNIQUE: Multidetector CT imaging of the head and cervical spine was performed following the standard protocol without intravenous contrast. Multiplanar CT image reconstructions of the cervical spine were also generated. COMPARISON:  Cervical spine CT 12/04/2014, head CT 10/07/2013 FINDINGS: CT HEAD FINDINGS Brain: No intracranial hemorrhage, mass effect, or midline shift. Unchanged degree of atrophy and chronic small vessel ischemia. Unchanged 7 mm slightly hyperattenuating subependymal nodule in the lateral body of the left lateral ventricle. No hydrocephalus. The basilar cisterns are patent. No evidence of territorial infarct or acute ischemia. No extra-axial or intracranial fluid collection. Vascular: Atherosclerosis of skullbase vasculature without hyperdense vessel or abnormal calcification. Skull: No fracture or focal lesion. Sinuses/Orbits: No acute finding.  Prior right cataract resection. Other: None. CT CERVICAL SPINE FINDINGS Alignment: Unchanged from prior exam with mild anterolisthesis of C4 on C5, C5 on C6, C6 on C7, likely facet mediated. Exaggerated cervical lordosis. No traumatic subluxation. Skull base and vertebrae: No acute fracture. The dens and skull base are intact. Soft tissues and spinal canal: No prevertebral fluid or swelling. No visible canal  hematoma. Disc levels: Diffuse disc space narrowing and endplate spurring. Slight progression of degenerative disc disease at C6-C7 since prior exam. Multilevel facet hypertrophy. Upper chest: Negative. Other: Carotid calcifications. IMPRESSION: 1.  No acute intracranial abnormality.  No skull fracture. 2. Multilevel degenerative change throughout the cervical spine without acute fracture. Electronically Signed   By: Keith Rake M.D.   On: 06/09/2018 00:18   Ct Cervical Spine Wo Contrast  Result Date: 06/09/2018 CLINICAL DATA:  Unwitnessed fall in the shower. Struck back of head. EXAM: CT HEAD WITHOUT CONTRAST CT CERVICAL SPINE WITHOUT CONTRAST TECHNIQUE: Multidetector CT imaging of the head and cervical spine was performed following the standard protocol without intravenous contrast. Multiplanar CT image reconstructions of the cervical spine were also generated. COMPARISON:  Cervical spine CT 12/04/2014, head CT 10/07/2013 FINDINGS: CT HEAD FINDINGS Brain: No intracranial hemorrhage, mass effect, or midline shift. Unchanged degree of atrophy and chronic small vessel ischemia. Unchanged 7 mm slightly hyperattenuating subependymal nodule in the lateral body of the left lateral ventricle. No hydrocephalus. The basilar cisterns are patent. No evidence of territorial infarct or acute ischemia. No extra-axial or intracranial fluid collection. Vascular: Atherosclerosis of skullbase vasculature without hyperdense vessel or abnormal calcification. Skull: No fracture or focal lesion. Sinuses/Orbits: No acute finding.  Prior right cataract resection. Other: None. CT CERVICAL SPINE FINDINGS Alignment: Unchanged from prior exam with mild anterolisthesis of C4 on C5, C5 on C6, C6 on C7, likely facet mediated. Exaggerated cervical lordosis. No traumatic subluxation. Skull base and vertebrae: No acute fracture. The dens and skull base are intact. Soft tissues and spinal canal: No prevertebral fluid or swelling. No visible  canal hematoma. Disc levels: Diffuse disc space narrowing and endplate spurring. Slight progression of degenerative disc disease at C6-C7 since prior exam. Multilevel facet hypertrophy. Upper chest: Negative. Other: Carotid calcifications. IMPRESSION: 1.  No acute intracranial abnormality.  No skull fracture. 2. Multilevel degenerative change throughout the cervical spine without acute fracture. Electronically Signed   By: Keith Rake M.D.   On: 06/09/2018 00:18   Dg Shoulder Left  Result Date: 06/09/2018 CLINICAL DATA:  Found on floor in shower, fall, LEFT shoulder pain EXAM: LEFT SHOULDER - 2+ VIEW COMPARISON:  None FINDINGS: Diffuse osseous demineralization. AC joint alignment normal. Displaced fracture at surgical neck LEFT humerus. No dislocation. No additional fractures identified. IMPRESSION: Displaced fracture at surgical neck of LEFT humerus. Electronically Signed   By: Lavonia Dana M.D.   On: 06/09/2018 00:57    ___________________________________  Procedures   ____________________________________________   INITIAL IMPRESSION / ASSESSMENT AND PLAN / ED COURSE  As part of my medical decision making, I reviewed the following data within the electronic MEDICAL RECORD NUMBER   84 year old female presenting with above-stated history and physical exam concerning for possible left humeral fracture as well as compression fracture of the thoracic spine as such x-rays were performed which confirmed both suspicions.  Patient was given IV morphine emergency department.  Patient discussed with Dr. Jannifer Franklin for hospital admission further evaluation and management patient also discussed with Dr. Harlow Mares orthopedic surgeon on-call. ____________________________________________  FINAL CLINICAL IMPRESSION(S) / ED DIAGNOSES  Final diagnoses:  Closed wedge compression fracture of tenth thoracic vertebra, initial encounter ([place])  Closed fracture of proximal end of left humerus, unspecified fracture  morphology, initial encounter     MEDICATIONS GIVEN DURING THIS VISIT:  Medications  potassium chloride SA (K-DUR,KLOR-CON) CR tablet 40 mEq (has no administration in time range)  potassium chloride 20 MEQ/15ML (10%) solution 40 mEq (has no administration in time range)  oxyCODONE-acetaminophen (PERCOCET/ROXICET) 5-325 MG per tablet 1 tablet (1 tablet Oral Given 06/09/18 0007)     ED Discharge Orders    None       Note:  This document was prepared using Dragon voice recognition software and may include unintentional dictation errors.    Gregor Hams, MD 06/09/18 3383    Gregor Hams, MD 06/09/18 [phone]

## 2018-06-08 NOTE — ED Triage Notes (Signed)
Per EMS pt takes her shower at 9pm and at 10pm the staff found her on the bottom of her shower. Pt hit the back of her head and her left shoulder. Left shoulder pain.

## 2018-06-09 ENCOUNTER — Other Ambulatory Visit: Payer: Self-pay

## 2018-06-09 ENCOUNTER — Ambulatory Visit: Payer: Medicare Other | Admitting: Internal Medicine

## 2018-06-09 ENCOUNTER — Emergency Department: Payer: Medicare Other

## 2018-06-09 DIAGNOSIS — S42309A Unspecified fracture of shaft of humerus, unspecified arm, initial encounter for closed fracture: Secondary | ICD-10-CM | POA: Diagnosis present

## 2018-06-09 LAB — COMPREHENSIVE METABOLIC PANEL
ALBUMIN: 3.7 g/dL (ref 3.5–5.0)
ALT: 25 U/L (ref 0–44)
ANION GAP: 10 (ref 5–15)
AST: 35 U/L (ref 15–41)
Alkaline Phosphatase: 55 U/L (ref 38–126)
BUN: 16 mg/dL (ref 8–23)
CHLORIDE: 87 mmol/L — AB (ref 98–111)
CO2: 26 mmol/L (ref 22–32)
Calcium: 8.5 mg/dL — ABNORMAL LOW (ref 8.9–10.3)
Creatinine, Ser: 0.71 mg/dL (ref 0.44–1.00)
GFR calc Af Amer: >60 mL/min (ref >60)
GFR calc non Af Amer: >60 mL/min (ref >60)
GLUCOSE: 129 mg/dL — AB (ref 70–99)
Potassium: 2.7 mmol/L — CL (ref 3.5–5.1)
Sodium: 123 mmol/L — ABNORMAL LOW (ref 135–145)
Total Bilirubin: 0.6 mg/dL (ref 0.3–1.2)
Total Protein: 6.9 g/dL (ref 6.5–8.1)

## 2018-06-09 LAB — MAGNESIUM: Magnesium: 1.6 mg/dL — ABNORMAL LOW (ref 1.7–2.4)

## 2018-06-09 LAB — CBC
HCT: 31.2 % — ABNORMAL LOW (ref 35.0–47.0)
Hemoglobin: 10.5 g/dL — ABNORMAL LOW (ref 12.0–16.0)
MCH: 27.5 pg (ref 26.0–34.0)
MCHC: 33.8 g/dL (ref 32.0–36.0)
MCV: 81.4 fL (ref 80.0–100.0)
Platelets: 264 10*3/uL (ref 150–440)
RBC: 3.84 MIL/uL (ref 3.80–5.20)
RDW: 13.9 % (ref 11.5–14.5)
WBC: 11.2 10*3/uL — ABNORMAL HIGH (ref 3.6–11.0)

## 2018-06-09 LAB — HEMOGLOBIN A1C
Hgb A1c MFr Bld: 5.5 % (ref 4.8–5.6)
Mean Plasma Glucose: 111.15 mg/dL

## 2018-06-09 LAB — TSH: TSH: 1.892 u[IU]/mL (ref 0.350–4.500)

## 2018-06-09 LAB — SODIUM: Sodium: 127 mmol/L — ABNORMAL LOW (ref 135–145)

## 2018-06-09 LAB — POTASSIUM: Potassium: 4.2 mmol/L (ref 3.5–5.1)

## 2018-06-09 MED ORDER — GUAIFENESIN-DM 100-10 MG/5ML PO SYRP: 5.0000 mL | ORAL_SOLUTION | ORAL | Status: DC | PRN

## 2018-06-09 MED ORDER — ADULT MULTIVITAMIN W/MINERALS CH
1.0000 | ORAL_TABLET | Freq: Every day | ORAL | Status: DC
  Administered 2018-06-10 – 2018-06-12 (×3): 1 via ORAL
  Filled 2018-06-09 (×3): qty 1

## 2018-06-09 MED ORDER — LATANOPROST 0.005 % OP SOLN
1.0000 [drp] | Freq: Every day | OPHTHALMIC | Status: DC
  Administered 2018-06-09 – 2018-06-11 (×3): 1 [drp] via OPHTHALMIC
  Filled 2018-06-09: qty 2.5

## 2018-06-09 MED ORDER — AMLODIPINE BESYLATE 5 MG PO TABS
5.0000 mg | ORAL_TABLET | Freq: Two times a day (BID) | ORAL | Status: DC
  Administered 2018-06-09 – 2018-06-12 (×6): 5 mg via ORAL
  Filled 2018-06-09 (×6): qty 1

## 2018-06-09 MED ORDER — OMEGA-3-ACID ETHYL ESTERS 1 G PO CAPS
1.0000 g | ORAL_CAPSULE | Freq: Two times a day (BID) | ORAL | Status: DC
  Administered 2018-06-09 – 2018-06-12 (×5): 1 g via ORAL
  Filled 2018-06-09 (×5): qty 1

## 2018-06-09 MED ORDER — LOSARTAN POTASSIUM 50 MG PO TABS
100.0000 mg | ORAL_TABLET | Freq: Every day | ORAL | Status: DC
  Administered 2018-06-10 – 2018-06-12 (×3): 100 mg via ORAL
  Filled 2018-06-09 (×3): qty 2

## 2018-06-09 MED ORDER — TRAMADOL HCL 50 MG PO TABS
50.0000 mg | ORAL_TABLET | Freq: Three times a day (TID) | ORAL | Status: DC | PRN
  Administered 2018-06-09: 50 mg via ORAL
  Filled 2018-06-09 (×2): qty 1

## 2018-06-09 MED ORDER — POTASSIUM CHLORIDE CRYS ER 20 MEQ PO TBCR
40.0000 meq | EXTENDED_RELEASE_TABLET | ORAL | Status: AC
  Administered 2018-06-09: 40 meq via ORAL
  Filled 2018-06-09: qty 2

## 2018-06-09 MED ORDER — CARBAMIDE PEROXIDE 6.5 % OT SOLN
5.0000 [drp] | Freq: Every day | OTIC | Status: DC | PRN
  Filled 2018-06-09: qty 15

## 2018-06-09 MED ORDER — DOCUSATE SODIUM 100 MG PO CAPS
100.0000 mg | ORAL_CAPSULE | Freq: Two times a day (BID) | ORAL | Status: DC
  Administered 2018-06-09 – 2018-06-12 (×4): 100 mg via ORAL
  Filled 2018-06-09 (×5): qty 1

## 2018-06-09 MED ORDER — DICYCLOMINE HCL 10 MG PO CAPS
10.0000 mg | ORAL_CAPSULE | Freq: Four times a day (QID) | ORAL | Status: DC | PRN
  Filled 2018-06-09: qty 1

## 2018-06-09 MED ORDER — LOPERAMIDE HCL 2 MG PO CAPS: 4.0000 mg | ORAL_CAPSULE | Freq: Two times a day (BID) | ORAL | Status: DC | PRN

## 2018-06-09 MED ORDER — LACTASE 3000 UNITS PO TABS
6000.0000 [IU] | ORAL_TABLET | Freq: Three times a day (TID) | ORAL | Status: DC
  Administered 2018-06-09 – 2018-06-12 (×9): 6000 [IU] via ORAL
  Filled 2018-06-09 (×12): qty 2

## 2018-06-09 MED ORDER — DIPHENHYDRAMINE HCL 25 MG PO CAPS: 25.0000 mg | ORAL_CAPSULE | ORAL | Status: DC | PRN

## 2018-06-09 MED ORDER — MELATONIN 5 MG PO TABS
5.0000 mg | ORAL_TABLET | Freq: Every day | ORAL | Status: DC
  Administered 2018-06-09 – 2018-06-11 (×3): 5 mg via ORAL
  Filled 2018-06-09 (×4): qty 1

## 2018-06-09 MED ORDER — NYSTATIN 100000 UNIT/GM EX POWD
1.0000 g | Freq: Two times a day (BID) | CUTANEOUS | Status: DC | PRN
  Filled 2018-06-09: qty 15

## 2018-06-09 MED ORDER — SERTRALINE HCL 50 MG PO TABS
25.0000 mg | ORAL_TABLET | Freq: Every day | ORAL | Status: DC
  Administered 2018-06-10 – 2018-06-12 (×3): 25 mg via ORAL
  Filled 2018-06-09 (×3): qty 1

## 2018-06-09 MED ORDER — BISMUTH SUBSALICYLATE 262 MG/15ML PO SUSP: 10.0000 mL | Freq: Three times a day (TID) | ORAL | Status: DC | PRN

## 2018-06-09 MED ORDER — CYCLOSPORINE 0.05 % OP EMUL
1.0000 [drp] | Freq: Two times a day (BID) | OPHTHALMIC | Status: DC | PRN
  Administered 2018-06-10: 1 [drp] via OPHTHALMIC
  Filled 2018-06-09 (×2): qty 1

## 2018-06-09 MED ORDER — ALUM & MAG HYDROXIDE-SIMETH 200-200-20 MG/5ML PO SUSP: 30.0000 mL | ORAL | Status: DC | PRN

## 2018-06-09 MED ORDER — VITAMIN B-12 1000 MCG PO TABS
500.0000 ug | ORAL_TABLET | Freq: Every day | ORAL | Status: DC
  Administered 2018-06-10 – 2018-06-12 (×3): 500 ug via ORAL
  Filled 2018-06-09 (×3): qty 1

## 2018-06-09 MED ORDER — VITAMIN B-12 500 MCG SL SUBL: 500.0000 ug | SUBLINGUAL_TABLET | Freq: Every day | SUBLINGUAL | Status: DC

## 2018-06-09 MED ORDER — BALSALAZIDE DISODIUM 750 MG PO CAPS
1500.0000 mg | ORAL_CAPSULE | Freq: Two times a day (BID) | ORAL | Status: DC
  Administered 2018-06-09 – 2018-06-11 (×4): 1500 mg via ORAL
  Filled 2018-06-09 (×7): qty 2

## 2018-06-09 MED ORDER — ENOXAPARIN SODIUM 40 MG/0.4ML ~~LOC~~ SOLN
40.0000 mg | Freq: Every day | SUBCUTANEOUS | Status: DC
  Administered 2018-06-09 – 2018-06-11 (×3): 40 mg via SUBCUTANEOUS
  Filled 2018-06-09 (×3): qty 0.4

## 2018-06-09 MED ORDER — ACETAMINOPHEN 650 MG RE SUPP: 650.0000 mg | Freq: Four times a day (QID) | RECTAL | Status: DC | PRN

## 2018-06-09 MED ORDER — POTASSIUM CHLORIDE 20 MEQ/15ML (10%) PO SOLN
40.0000 meq | Freq: Once | ORAL | Status: AC
  Administered 2018-06-09: 40 meq via ORAL
  Filled 2018-06-09: qty 30

## 2018-06-09 MED ORDER — ACETAMINOPHEN 325 MG PO TABS: 650.0000 mg | ORAL_TABLET | Freq: Four times a day (QID) | ORAL | Status: DC | PRN

## 2018-06-09 MED ORDER — MAGNESIUM OXIDE 400 (241.3 MG) MG PO TABS: 200.0000 mg | ORAL_TABLET | Freq: Every day | ORAL | Status: DC

## 2018-06-09 MED ORDER — ONDANSETRON HCL 4 MG/2ML IJ SOLN: 4.0000 mg | Freq: Four times a day (QID) | INTRAMUSCULAR | Status: DC | PRN

## 2018-06-09 MED ORDER — OMEGA-3 FATTY ACIDS 1000 MG PO CAPS: 1.0000 g | ORAL_CAPSULE | Freq: Two times a day (BID) | ORAL | Status: DC

## 2018-06-09 MED ORDER — MAGNESIUM HYDROXIDE 400 MG/5ML PO SUSP: 30.0000 mL | Freq: Every day | ORAL | Status: DC | PRN

## 2018-06-09 MED ORDER — ASPIRIN EC 81 MG PO TBEC
81.0000 mg | DELAYED_RELEASE_TABLET | Freq: Every day | ORAL | Status: DC
  Administered 2018-06-10 – 2018-06-12 (×3): 81 mg via ORAL
  Filled 2018-06-09 (×3): qty 1

## 2018-06-09 MED ORDER — DEXTROMETHORPHAN-GUAIFENESIN 10-100 MG/5ML PO LIQD: 10.0000 mL | ORAL | Status: DC | PRN

## 2018-06-09 MED ORDER — ONDANSETRON HCL 4 MG PO TABS: 4.0000 mg | ORAL_TABLET | Freq: Four times a day (QID) | ORAL | Status: DC | PRN

## 2018-06-09 MED ORDER — POTASSIUM CHLORIDE IN NACL 40-0.9 MEQ/L-% IV SOLN
INTRAVENOUS | Status: DC
  Administered 2018-06-09: 100 mL/h via INTRAVENOUS
  Filled 2018-06-09 (×3): qty 1000

## 2018-06-09 MED ORDER — DIAZEPAM 5 MG PO TABS: 2.5000 mg | ORAL_TABLET | Freq: Two times a day (BID) | ORAL | Status: DC | PRN

## 2018-06-09 MED ORDER — HYDRALAZINE HCL 25 MG PO TABS
25.0000 mg | ORAL_TABLET | Freq: Two times a day (BID) | ORAL | Status: DC
  Administered 2018-06-09 – 2018-06-12 (×6): 25 mg via ORAL
  Filled 2018-06-09 (×6): qty 1

## 2018-06-09 MED ORDER — MAGNESIUM SULFATE 2 GM/50ML IV SOLN
2.0000 g | Freq: Once | INTRAVENOUS | Status: AC
  Administered 2018-06-09: 2 g via INTRAVENOUS
  Filled 2018-06-09: qty 50

## 2018-06-09 NOTE — Progress Notes (Signed)
Family Meeting Note  Advance Directive:yes  Today a meeting took place with the Patient, grand daughter Sabrina Hall at bed side    The following clinical team members were present during this meeting:MD  The following were discussed:Patient's diagnosis: Acute left shoulder pain with a left humerus fracture, hyponatremia, hypokalemia, hypomagnesemia compression fracture, treatment plan of care discussed in detail all other comorbidities as documented below are also discussed    Arthritis   . Cataract   . Crohn's   . Deviated septum   . Eczema   . H/O seasonal allergies   . History of hemorrhoids   . History of shingles   . Hypertension   . Psoriasis   . Vitamin B12 deficiency       , Patient's progosis: Unable to determine and Goals for treatment: DNR, daughter  [name]  Additional follow-up to be provided: HOSPITALIST,cardio   Time spent during discussion:17 min   Sabrina Mango, MD

## 2018-06-09 NOTE — NC FL2 (Signed)
[place] LEVEL OF CARE SCREENING TOOL     IDENTIFICATION  Patient Name: Sabrina Hall Birthdate: 1923-08-08 Sex: female Admission Date (Current Location): 06/08/2018  Ideal and [place] Number:  Engineering geologist and Address:  [facility], [address], [place], [place] [address]      Provider Number: [id number]  Attending Physician Name and Address:  Nicholes Mango, MD  Relative Name and Phone Number:       Current Level of Care: Hospital Recommended Level of Care: [place] Prior Approval Number:    Date Approved/Denied:   PASRR Number: [id number])  Discharge Plan: SNF    Current Diagnoses: Patient Active Problem List   Diagnosis Date Noted  . Humerus fracture 06/09/2018  . Anxiety 06/06/2018  . Hyponatremia 06/06/2018  . Acute urinary retention 06/06/2018  . Vitamin B12 deficiency   . Mood disorder ([place]) 01/31/2016  . Sleep disorder 12/07/2015  . POLYP, COLON 11/22/2007  . Essential hypertension 11/22/2007  . Crohn's disease ([place]) 11/22/2007  . DIVERTICULITIS OF COLON 11/22/2007  . Generalized osteoarthritis of multiple sites 11/22/2007  . SKIN CANCER, HX OF 11/22/2007    Orientation RESPIRATION BLADDER Height & Weight     Self, Time, Situation, Place  Normal Continent Weight: 113 lb 12.1 oz (51.6 kg) Height:  5' 1"  (154.9 cm)  BEHAVIORAL SYMPTOMS/MOOD NEUROLOGICAL BOWEL NUTRITION STATUS      Continent Diet(Diet: NPO for surgery to be advanced. )  AMBULATORY STATUS COMMUNICATION OF NEEDS Skin   Extensive Assist Verbally Surgical wounds                       Personal Care Assistance Level of Assistance  Bathing, Feeding, Dressing Bathing Assistance: Limited assistance Feeding assistance: Independent Dressing Assistance: Limited assistance     Functional Limitations Info  Sight, Hearing, Speech Sight Info: Adequate Hearing Info: Adequate Speech Info: Adequate    SPECIAL CARE  FACTORS FREQUENCY  PT (By licensed PT), OT (By licensed OT)     PT Frequency: (5) OT Frequency: (5)            Contractures      Additional Factors Info  Code Status, Allergies Code Status Info: (DNR ) Allergies Info: (Lactose, Maxzide Hydrochlorothiazide W-triamterene, Nsaids, Sulfonamide Derivatives, Penicillins)           Current Medications (06/09/2018):  This is the current hospital active medication list Current Facility-Administered Medications  Medication Dose Route Frequency Provider Last Rate Last Dose  . 0.9 % NaCl with KCl 40 mEq / L  infusion   Intravenous Continuous Harrie Foreman, MD 100 mL/hr at 06/09/18 0453    . acetaminophen (TYLENOL) tablet 650 mg  650 mg Oral Q6H PRN Harrie Foreman, MD       Or  . acetaminophen (TYLENOL) suppository 650 mg  650 mg Rectal Q6H PRN Harrie Foreman, MD      . alum & mag hydroxide-simeth (MAALOX/MYLANTA) 200-200-20 MG/5ML suspension 30 mL  30 mL Oral Q4H PRN Harrie Foreman, MD      . amLODipine (NORVASC) tablet 5 mg  5 mg Oral BID Harrie Foreman, MD      . aspirin EC tablet 81 mg  81 mg Oral Daily Harrie Foreman, MD      . balsalazide (COLAZAL) capsule 1,500 mg  1,500 mg Oral BID Harrie Foreman, MD      . carbamide peroxide (DEBROX) 6.5 % OTIC (EAR) solution  5 drop  5 drop Both EARS Daily PRN Harrie Foreman, MD      . cycloSPORINE (RESTASIS) 0.05 % ophthalmic emulsion 1 drop  1 drop Both Eyes BID PRN Harrie Foreman, MD      . diazepam (VALIUM) tablet 2.5 mg  2.5 mg Oral Q12H PRN Harrie Foreman, MD      . dicyclomine (BENTYL) capsule 10 mg  10 mg Oral QID PRN Harrie Foreman, MD      . diphenhydrAMINE (BENADRYL) capsule 25 mg  25 mg Oral Q4H PRN Harrie Foreman, MD      . docusate sodium (COLACE) capsule 100 mg  100 mg Oral BID Harrie Foreman, MD      . guaiFENesin-dextromethorphan (ROBITUSSIN DM) 100-10 MG/5ML syrup 5 mL  5 mL Oral Q4H PRN Harrie Foreman, MD      . hydrALAZINE  (APRESOLINE) tablet 25 mg  25 mg Oral BID Harrie Foreman, MD      . lactase (LACTAID) tablet 6,000 Units  6,000 Units Oral TID WC Harrie Foreman, MD      . latanoprost (XALATAN) 0.005 % ophthalmic solution 1 drop  1 drop Both Eyes QHS Harrie Foreman, MD      . losartan (COZAAR) tablet 100 mg  100 mg Oral Daily Harrie Foreman, MD      . magnesium hydroxide (MILK OF MAGNESIA) suspension 30 mL  30 mL Oral Daily PRN Harrie Foreman, MD      . magnesium oxide (MAG-OX) tablet 200 mg  200 mg Oral Daily Harrie Foreman, MD      . Melatonin TABS 5 mg  5 mg Oral QHS Harrie Foreman, MD      . multivitamin with minerals tablet 1 tablet  1 tablet Oral Daily Harrie Foreman, MD      . nystatin (MYCOSTATIN/NYSTOP) topical powder 1 g  1 g Topical BID PRN Harrie Foreman, MD      . omega-3 acid ethyl esters (LOVAZA) capsule 1 g  1 g Oral BID Harrie Foreman, MD      . ondansetron [facility]) tablet 4 mg  4 mg Oral Q6H PRN Harrie Foreman, MD       Or  . ondansetron [facility]) injection 4 mg  4 mg Intravenous Q6H PRN Harrie Foreman, MD      . sertraline (ZOLOFT) tablet 25 mg  25 mg Oral Daily Harrie Foreman, MD      . traMADol [facility]) tablet 50 mg  50 mg Oral Q8H PRN Harrie Foreman, MD      . vitamin B-12 (CYANOCOBALAMIN) tablet 500 mcg  500 mcg Oral Daily Harrie Foreman, MD         Discharge Medications: Please see discharge summary for a list of discharge medications.  Relevant Imaging Results:  Relevant Lab Results:   Additional Information (SSN: [id number])  Jamaiya Tunnell, Veronia Beets, LCSW

## 2018-06-09 NOTE — H&P (Signed)
Sabrina Hall is an 84 y.o. female.   Chief Complaint: Fall HPI: The patient with past medical history of hypertension, Crohn's disease and urinary retention presents to the emergency department after suffering a fall.  The patient slipped and hit the back of her head.  She denies loss of consciousness but reports arm pain as well as neck and back pain.  X-rays of her left arm revealed a closed fracture of the left humerus.  The patient also sustained a compression fracture of her thoracic vertebra prompted the emergency department staff to call the hospitalist service for admission.  Past Medical History:  Diagnosis Date  . Arthritis   . Cataract   . Crohn's   . Deviated septum   . Eczema   . H/O seasonal allergies   . History of hemorrhoids   . History of shingles   . Hypertension   . Psoriasis   . Vitamin B12 deficiency     Past Surgical History:  Procedure Laterality Date  . ABDOMINAL HYSTERECTOMY    . basel cell carcinoma    . CATARACT EXTRACTION     right eye  . CHOLECYSTECTOMY    . HYSTERECTOMY    . ILEOCECOSTOMY    . MOHS SURGERY    . NASAL SEPTUM SURGERY    . NECK SURGERY  2004   mass removed from cervical spine  . NOSE SURGERY     basal cell carcinoma of nose    Family History  Problem Relation Age of Onset  . Stroke Father   . Cancer Neg Hx   . Diabetes Neg Hx    Social History:  reports that she quit smoking about 36 years ago. Her smoking use included cigarettes. She smoked 0.50 packs per day. She has never used smokeless tobacco. She reports that she drinks alcohol. She reports that she does not use drugs.  Allergies:  Allergies  Allergen Reactions  . Lactose Dermatitis  . Maxzide [Hydrochlorothiazide W-Triamterene] Other (See Comments)    causes electrolyte disturbance  . Nsaids Other (See Comments)    Reaction: unknown  . Sulfonamide Derivatives Other (See Comments)    Reaction: unknown  . Penicillins Rash and Other (See Comments)     Reaction pulled from Care Everywhere Has patient had a PCN reaction causing immediate rash, facial/tongue/throat swelling, SOB or lightheadedness with hypotension: Unknown Has patient had a PCN reaction causing severe rash involving mucus membranes or skin necrosis: Unknown Has patient had a PCN reaction that required hospitalization: Unknown Has patient had a PCN reaction occurring within the last 10 years: Unknown If all of the above answers are "NO", then may proceed with Cephalosporin use.     Medications Prior to Admission  Medication Sig Dispense Refill  . acetaminophen (TYLENOL) 325 MG tablet Take 325-650 mg by mouth every 4 (four) hours as needed for fever (general discomfort).    Marland Kitchen acetaminophen (TYLENOL) 500 MG tablet Take 500 mg by mouth 2 (two) times daily.    Marland Kitchen alum & mag hydroxide-simeth (MAALOX/MYLANTA) 200-200-20 MG/5ML suspension Take 30 mLs by mouth every 4 (four) hours as needed for indigestion or flatulence (upset stomach).    Marland Kitchen amLODipine (NORVASC) 5 MG tablet TAKE ONE TABLET BY MOUTH TWICE DAILY 60 tablet 3  . aspirin 81 MG tablet Take 81 mg by mouth daily.    . balsalazide (COLAZAL) 750 MG capsule Take 1,500 mg by mouth 2 (two) times daily.    Marland Kitchen bismuth subsalicylate (PEPTO BISMOL) 262 MG/15ML suspension Take  10 mLs by mouth 3 (three) times daily as needed for diarrhea or loose stools.    . carbamide peroxide (DEBROX) 6.5 % OTIC solution Place 5 drops into both ears daily as needed (cerumen impaction). Instill x5 days    . Cyanocobalamin (VITAMIN B-12) 500 MCG SUBL Place 500 mcg under the tongue daily.    . cycloSPORINE (RESTASIS) 0.05 % ophthalmic emulsion Place 1 drop into both eyes 2 (two) times daily.    . cycloSPORINE (RESTASIS) 0.05 % ophthalmic emulsion Place 1 drop into both eyes 2 (two) times daily as needed (dry eyes).    Marland Kitchen dextromethorphan-guaiFENesin (ROBITUSSIN-DM) 10-100 MG/5ML liquid Take 10 mLs by mouth every 4 (four) hours as needed for cough.    .  diazepam (VALIUM) 5 MG tablet Take 2.5 mg by mouth every 12 (twelve) hours as needed for anxiety.     . dicyclomine (BENTYL) 10 MG capsule Take 10 mg by mouth 4 (four) times daily as needed (diarrhea/ abdominal pain).    Marland Kitchen diphenhydrAMINE (BENADRYL) 25 mg capsule Take 25 mg by mouth every 4 (four) hours as needed for itching or allergies.    . fish oil-omega-3 fatty acids 1000 MG capsule Take 1 g by mouth 2 (two) times daily.     . hydrALAZINE (APRESOLINE) 25 MG tablet TAKE ONE TABLET BY MOUTH TWICE DAILY 60 tablet 3  . lactase (LACTAID) 3000 units tablet Take 6,000 Units by mouth 3 (three) times daily with meals.    . latanoprost (XALATAN) 0.005 % ophthalmic solution Place 1 drop into both eyes at bedtime.    Marland Kitchen loperamide (IMODIUM) 2 MG capsule Take 4 mg by mouth 2 (two) times daily as needed for diarrhea or loose stools.     Marland Kitchen losartan (COZAAR) 100 MG tablet Take 100 mg by mouth daily.    . magnesium hydroxide (MILK OF MAGNESIA) 400 MG/5ML suspension Take 30 mLs by mouth daily as needed for mild constipation.    . magnesium oxide (MAG-OX) 400 MG tablet Take 200 mg by mouth daily.     . Melatonin 3 MG TABS Take 1 tablet by mouth at bedtime.    . Multiple Vitamin (MULTIVITAMIN) tablet Take 1 tablet by mouth daily.      Marland Kitchen nystatin (NYSTATIN) powder Apply 1 g topically 2 (two) times daily as needed (rash).    . ondansetron (ZOFRAN) 4 MG tablet Take 4 mg by mouth every 8 (eight) hours as needed for nausea or vomiting.    . sertraline (ZOLOFT) 25 MG tablet Take 1 tablet (25 mg total) by mouth daily. 30 tablet 11  . traMADol (ULTRAM) 50 MG tablet Take 1 tablet by mouth every 8 (eight) hours as needed for moderate pain.       Results for orders placed or performed during the hospital encounter of 06/08/18 (from the past 48 hour(s))  CBC     Status: Abnormal   Collection Time: 06/09/18  1:33 AM  Result Value Ref Range   WBC 11.2 (H) 3.6 - 11.0 K/uL   RBC 3.84 3.80 - 5.20 MIL/uL   Hemoglobin 10.5  (L) 12.0 - 16.0 g/dL   HCT 31.2 (L) 35.0 - 47.0 %   MCV 81.4 80.0 - 100.0 fL   MCH 27.5 26.0 - 34.0 pg   MCHC 33.8 32.0 - 36.0 g/dL   RDW 13.9 11.5 - 14.5 %   Platelets 264 150 - 440 K/uL    Comment: Performed at [facility], [address] [place],  [place] [address]  Comprehensive metabolic panel     Status: Abnormal   Collection Time: 06/09/18  1:33 AM  Result Value Ref Range   Sodium 123 (L) 135 - 145 mmol/L   Potassium 2.7 (LL) 3.5 - 5.1 mmol/L    Comment: CRITICAL RESULT CALLED TO, READ BACK BY AND VERIFIED WITH JENNIFER DALEY AT 0158 ON 06/09/18 [place].    Chloride 87 (L) 98 - 111 mmol/L   CO2 26 22 - 32 mmol/L   Glucose, Bld 129 (H) 70 - 99 mg/dL   BUN 16 8 - 23 mg/dL   Creatinine, Ser 0.71 0.44 - 1.00 mg/dL   Calcium 8.5 (L) 8.9 - 10.3 mg/dL   Total Protein 6.9 6.5 - 8.1 g/dL   Albumin 3.7 3.5 - 5.0 g/dL   AST 35 15 - 41 U/L   ALT 25 0 - 44 U/L   Alkaline Phosphatase 55 38 - 126 U/L   Total Bilirubin 0.6 0.3 - 1.2 mg/dL   GFR calc non Af Amer >60 >60 mL/min   GFR calc Af Amer >60 >60 mL/min    Comment: (NOTE) The eGFR has been calculated using the CKD EPI equation. This calculation has not been validated in all clinical situations. eGFR's persistently <60 mL/min signify possible Chronic Kidney Disease.    Anion gap 10 5 - 15    Comment: Performed at [facility], [address] [place], [place] [address]  TSH     Status: None   Collection Time: 06/09/18  4:59 AM  Result Value Ref Range   TSH 1.892 0.350 - 4.500 uIU/mL    Comment: Performed by a 3rd Generation assay with a functional sensitivity of <=0.01 uIU/mL. Performed at [facility], [address] [place],  [address]    Dg Thoracic Spine 2 View  Result Date: 06/09/2018 CLINICAL DATA:  Found on floor in shower, fall, hit back of head and LEFT shoulder, LEFT shoulder pain EXAM: THORACIC SPINE 2 VIEWS COMPARISON:  None Correlation: Abdominal radiographs 10/07/2014  FINDINGS: Osseous demineralization. 13 pairs of ribs. Superior endplate compression fracture of a lower thoracic vertebra, likely T10, with 50% anterior height loss; this is not identified on prior abdominal radiographs. No additional fracture, dislocation, or bone destruction. IMPRESSION: Superior endplate compression fracture of approximately T10 vertebral body with 50% anterior height loss. Electronically Signed   By: Lavonia Dana M.D.   On: 06/09/2018 01:00   Ct Head Wo Contrast  Result Date: 06/09/2018 CLINICAL DATA:  Unwitnessed fall in the shower. Struck back of head. EXAM: CT HEAD WITHOUT CONTRAST CT CERVICAL SPINE WITHOUT CONTRAST TECHNIQUE: Multidetector CT imaging of the head and cervical spine was performed following the standard protocol without intravenous contrast. Multiplanar CT image reconstructions of the cervical spine were also generated. COMPARISON:  Cervical spine CT 12/04/2014, head CT 10/07/2013 FINDINGS: CT HEAD FINDINGS Brain: No intracranial hemorrhage, mass effect, or midline shift. Unchanged degree of atrophy and chronic small vessel ischemia. Unchanged 7 mm slightly hyperattenuating subependymal nodule in the lateral body of the left lateral ventricle. No hydrocephalus. The basilar cisterns are patent. No evidence of territorial infarct or acute ischemia. No extra-axial or intracranial fluid collection. Vascular: Atherosclerosis of skullbase vasculature without hyperdense vessel or abnormal calcification. Skull: No fracture or focal lesion. Sinuses/Orbits: No acute finding.  Prior right cataract resection. Other: None. CT CERVICAL SPINE FINDINGS Alignment: Unchanged from prior exam with mild anterolisthesis of C4 on C5, C5 on C6, C6 on C7, likely facet mediated. Exaggerated  cervical lordosis. No traumatic subluxation. Skull base and vertebrae: No acute fracture. The dens and skull base are intact. Soft tissues and spinal canal: No prevertebral fluid or swelling. No visible canal  hematoma. Disc levels: Diffuse disc space narrowing and endplate spurring. Slight progression of degenerative disc disease at C6-C7 since prior exam. Multilevel facet hypertrophy. Upper chest: Negative. Other: Carotid calcifications. IMPRESSION: 1.  No acute intracranial abnormality.  No skull fracture. 2. Multilevel degenerative change throughout the cervical spine without acute fracture. Electronically Signed   By: Keith Rake M.D.   On: 06/09/2018 00:18   Ct Cervical Spine Wo Contrast  Result Date: 06/09/2018 CLINICAL DATA:  Unwitnessed fall in the shower. Struck back of head. EXAM: CT HEAD WITHOUT CONTRAST CT CERVICAL SPINE WITHOUT CONTRAST TECHNIQUE: Multidetector CT imaging of the head and cervical spine was performed following the standard protocol without intravenous contrast. Multiplanar CT image reconstructions of the cervical spine were also generated. COMPARISON:  Cervical spine CT 12/04/2014, head CT 10/07/2013 FINDINGS: CT HEAD FINDINGS Brain: No intracranial hemorrhage, mass effect, or midline shift. Unchanged degree of atrophy and chronic small vessel ischemia. Unchanged 7 mm slightly hyperattenuating subependymal nodule in the lateral body of the left lateral ventricle. No hydrocephalus. The basilar cisterns are patent. No evidence of territorial infarct or acute ischemia. No extra-axial or intracranial fluid collection. Vascular: Atherosclerosis of skullbase vasculature without hyperdense vessel or abnormal calcification. Skull: No fracture or focal lesion. Sinuses/Orbits: No acute finding.  Prior right cataract resection. Other: None. CT CERVICAL SPINE FINDINGS Alignment: Unchanged from prior exam with mild anterolisthesis of C4 on C5, C5 on C6, C6 on C7, likely facet mediated. Exaggerated cervical lordosis. No traumatic subluxation. Skull base and vertebrae: No acute fracture. The dens and skull base are intact. Soft tissues and spinal canal: No prevertebral fluid or swelling. No visible  canal hematoma. Disc levels: Diffuse disc space narrowing and endplate spurring. Slight progression of degenerative disc disease at C6-C7 since prior exam. Multilevel facet hypertrophy. Upper chest: Negative. Other: Carotid calcifications. IMPRESSION: 1.  No acute intracranial abnormality.  No skull fracture. 2. Multilevel degenerative change throughout the cervical spine without acute fracture. Electronically Signed   By: Keith Rake M.D.   On: 06/09/2018 00:18   Dg Shoulder Left  Result Date: 06/09/2018 CLINICAL DATA:  Found on floor in shower, fall, LEFT shoulder pain EXAM: LEFT SHOULDER - 2+ VIEW COMPARISON:  None FINDINGS: Diffuse osseous demineralization. AC joint alignment normal. Displaced fracture at surgical neck LEFT humerus. No dislocation. No additional fractures identified. IMPRESSION: Displaced fracture at surgical neck of LEFT humerus. Electronically Signed   By: Lavonia Dana M.D.   On: 06/09/2018 00:57    Review of Systems  Constitutional: Negative for chills and fever.  HENT: Negative for sore throat and tinnitus.   Eyes: Negative for blurred vision and redness.  Respiratory: Negative for cough and shortness of breath.   Cardiovascular: Negative for chest pain, palpitations, orthopnea and PND.  Gastrointestinal: Negative for abdominal pain, diarrhea, nausea and vomiting.  Genitourinary: Negative for dysuria, frequency and urgency.  Musculoskeletal: Positive for falls. Negative for joint pain and myalgias.  Skin: Negative for rash.       No lesions  Neurological: Negative for speech change, focal weakness and weakness.  Endo/Heme/Allergies: Does not bruise/bleed easily.       No temperature intolerance  Psychiatric/Behavioral: Negative for depression and suicidal ideas.    Blood pressure (!) 134/51, pulse 72, temperature 97.9 F (36.6 C), temperature source Oral, resp.  rate 19, height 5' 1"  (1.549 m), weight 51.6 kg, SpO2 95 %. Physical Exam  Vitals  reviewed. Constitutional: She is oriented to person, place, and time. She appears well-developed and well-nourished. No distress.  HENT:  Head: Normocephalic and atraumatic.  Mouth/Throat: Oropharynx is clear and moist.  Eyes: Pupils are equal, round, and reactive to light. Conjunctivae and EOM are normal. No scleral icterus.  Neck: Normal range of motion. Neck supple. No JVD present. No tracheal deviation present. No thyromegaly present.  Cardiovascular: Normal rate, regular rhythm and normal heart sounds. Exam reveals no gallop and no friction rub.  No murmur heard. Respiratory: Effort normal and breath sounds normal.  GI: Soft. Bowel sounds are normal. She exhibits no distension. There is no tenderness.  Genitourinary:  Genitourinary Comments: Deferred  Musculoskeletal: She exhibits tenderness and deformity. She exhibits no edema.  Lymphadenopathy:    She has no cervical adenopathy.  Neurological: She is alert and oriented to person, place, and time. No cranial nerve deficit. She exhibits normal muscle tone.  Skin: Skin is warm. No rash noted. No erythema.  Psychiatric: She has a normal mood and affect. Her behavior is normal. Judgment and thought content normal.     Assessment/Plan This is a 84 year old female admitted for multiple fractures. 1.  Humerus fracture: Closed MI: Left arm.  Consult orthopedic surgery.  Manage severe pain with IV morphine as needed. 2.  Compression fracture: T10; plan as above 3.  Hypertension: Supple for age; continue amlodipine and losartan and hydralazine 4.  Crohn's disease: Stable; continue balsalazide 5.  DVT prophylaxis: SCDs 6.  GI prophylaxis: None The patient is a full code.  Time spent on admission orders and patient care approximately 45 minutes  Harrie Foreman, MD 06/09/2018, 7:26 AM

## 2018-06-09 NOTE — Clinical Social Work Note (Signed)
Clinical Social Work Assessment  Patient Details  Name: Sabrina Hall MRN: [id number] Date of Birth: 12-03-1922  Date of referral:  06/09/18               Reason for consult:  Facility Placement                Permission sought to share information with:  Chartered certified accountant granted to share information::  Yes, Verbal Permission Granted  Name::      [place]::     Relationship::     Contact Information:     Housing/Transportation Living arrangements for the past 2 months:  [employer] of Information:  Patient Patient Interpreter Needed:  None Criminal Activity/Legal Involvement Pertinent to Current Situation/Hospitalization:  No - Comment as needed Significant Relationships:  Adult Children Lives with:  Facility Resident Do you feel safe going back to the place where you live?  Yes Need for family participation in patient care:  Yes (Comment)  Care giving concerns:  Patient is a resident at [facility].    Social Worker assessment / plan:  Holiday representative (CSW) reviewed chart and noted that patient has a humerus fracture. Surgery and PT are pending. CSW contacted [facility] at [facility]. Per Seth Bake patient can come to the [facility] building and she will get [facility] SNF authorization. CSW met with patient and made her aware of above. Patient is agreeable to go to the SNF building at [facility]. FL2 complete. CSW will continue to follow and assist as needed.   Employment status:  Disabled (Comment on whether or not currently receiving Disability), Retired Nurse, adult PT Recommendations:  Not assessed at this time Information / Referral to community resources:  [place]  Patient/Family's Response to care:  Patient is agreeable to go to the [employer].   Patient/Family's Understanding of and Emotional Response to Diagnosis,  Current Treatment, and Prognosis:  Patient was very pleasant and thanked CSW for assistance.   Emotional Assessment Appearance:  Appears stated age Attitude/Demeanor/Rapport:    Affect (typically observed):  Accepting, Adaptable, Pleasant Orientation:  Oriented to Self, Oriented to Place, Oriented to  Time, Oriented to Situation Alcohol / Substance use:  Not Applicable Psych involvement (Current and /or in the community):  No (Comment)  Discharge Needs  Concerns to be addressed:  Discharge Planning Concerns Readmission within the last 30 days:  No Current discharge risk:  Dependent with Mobility Barriers to Discharge:  Continued Medical Work up   [employer], Veronia Beets, LCSW 06/09/2018, 2:30 PM

## 2018-06-09 NOTE — Progress Notes (Signed)
[facility] NAME: Sabrina Hall    MR#:  [id number]  DATE OF BIRTH:  1923-06-05  SUBJECTIVE:  CHIEF COMPLAINT:  Pt is resting ok , no complaints, no family at bed side  REVIEW OF SYSTEMS:  CONSTITUTIONAL: No fever, fatigue or weakness.  RESPIRATORY: No cough, shortness of breath, wheezing or hemoptysis.  CARDIOVASCULAR: No chest pain, orthopnea, edema.  GASTROINTESTINAL: No nausea, vomiting, diarrhea or abdominal pain.  GENITOURINARY: No dysuria, hematuria.  ENDOCRINE: No polyuria, nocturia,  MUSCULOSKELETAL: Left shoulder is painful NEUROLOGIC: No tingling, numbness, weakness.  PSYCHIATRY: No anxiety or depression.   DRUG ALLERGIES:   Allergies  Allergen Reactions  . Lactose Dermatitis  . Maxzide [Hydrochlorothiazide W-Triamterene] Other (See Comments)    causes electrolyte disturbance  . Nsaids Other (See Comments)    Reaction: unknown  . Sulfonamide Derivatives Other (See Comments)    Reaction: unknown  . Penicillins Rash and Other (See Comments)    Reaction pulled from Care Everywhere Has patient had a PCN reaction causing immediate rash, facial/tongue/throat swelling, SOB or lightheadedness with hypotension: Unknown Has patient had a PCN reaction causing severe rash involving mucus membranes or skin necrosis: Unknown Has patient had a PCN reaction that required hospitalization: Unknown Has patient had a PCN reaction occurring within the last 10 years: Unknown If all of the above answers are "NO", then may proceed with Cephalosporin use.     VITALS:  Blood pressure (!) 156/66, pulse 79, temperature 98.4 F (36.9 C), temperature source Oral, resp. rate 16, height 5' 1"  (1.549 m), weight 51.6 kg, SpO2 96 %.  PHYSICAL EXAMINATION:  GENERAL:  84 y.o.-year-old patient lying in the bed with no acute distress.  EYES: Pupils equal, round, reactive to light and accommodation. No scleral icterus. Extraocular  muscles intact.  HEENT: Head atraumatic, normocephalic. Oropharynx and nasopharynx clear.  NECK:  Supple, no jugular venous distention. No thyroid enlargement, no tenderness.  LUNGS: Normal breath sounds bilaterally, no wheezing, rales,rhonchi or crepitation. No use of accessory muscles of respiration.  CARDIOVASCULAR: S1, S2 normal. No murmurs, rubs, or gallops.  ABDOMEN: Soft, nontender, nondistended. Bowel sounds present. EXTREMITIES: Left shoulder is tender no pedal edema, cyanosis, or clubbing.  NEUROLOGIC: Awake and alert crania Sensation intact. Gait not checked.  PSYCHIATRIC: The patient is alert and oriented 1-2  SKIN: No obvious rash, lesion, or ulcer.    LABORATORY PANEL:   CBC Recent Labs  Lab 06/09/18 0133  WBC 11.2*  HGB 10.5*  HCT 31.2*  PLT 264   ------------------------------------------------------------------------------------------------------------------  Chemistries  Recent Labs  Lab 06/09/18 0133 06/09/18 0456 06/09/18 1300  NA 123*  --  127*  K 2.7*  --  4.2  CL 87*  --   --   CO2 26  --   --   GLUCOSE 129*  --   --   BUN 16  --   --   CREATININE 0.71  --   --   CALCIUM 8.5*  --   --   MG  --  1.6*  --   AST 35  --   --   ALT 25  --   --   ALKPHOS 55  --   --   BILITOT 0.6  --   --    ------------------------------------------------------------------------------------------------------------------  Cardiac Enzymes No results for input(s): TROPONINI in the last 168 hours. ------------------------------------------------------------------------------------------------------------------  RADIOLOGY:  Dg Thoracic Spine 2 View  Result Date: 06/09/2018 CLINICAL DATA:  Found on  floor in shower, fall, hit back of head and LEFT shoulder, LEFT shoulder pain EXAM: THORACIC SPINE 2 VIEWS COMPARISON:  None Correlation: Abdominal radiographs 10/07/2014 FINDINGS: Osseous demineralization. 13 pairs of ribs. Superior endplate compression fracture of a lower  thoracic vertebra, likely T10, with 50% anterior height loss; this is not identified on prior abdominal radiographs. No additional fracture, dislocation, or bone destruction. IMPRESSION: Superior endplate compression fracture of approximately T10 vertebral body with 50% anterior height loss. Electronically Signed   By: Lavonia Dana M.D.   On: 06/09/2018 01:00   Ct Head Wo Contrast  Result Date: 06/09/2018 CLINICAL DATA:  Unwitnessed fall in the shower. Struck back of head. EXAM: CT HEAD WITHOUT CONTRAST CT CERVICAL SPINE WITHOUT CONTRAST TECHNIQUE: Multidetector CT imaging of the head and cervical spine was performed following the standard protocol without intravenous contrast. Multiplanar CT image reconstructions of the cervical spine were also generated. COMPARISON:  Cervical spine CT 12/04/2014, head CT 10/07/2013 FINDINGS: CT HEAD FINDINGS Brain: No intracranial hemorrhage, mass effect, or midline shift. Unchanged degree of atrophy and chronic small vessel ischemia. Unchanged 7 mm slightly hyperattenuating subependymal nodule in the lateral body of the left lateral ventricle. No hydrocephalus. The basilar cisterns are patent. No evidence of territorial infarct or acute ischemia. No extra-axial or intracranial fluid collection. Vascular: Atherosclerosis of skullbase vasculature without hyperdense vessel or abnormal calcification. Skull: No fracture or focal lesion. Sinuses/Orbits: No acute finding.  Prior right cataract resection. Other: None. CT CERVICAL SPINE FINDINGS Alignment: Unchanged from prior exam with mild anterolisthesis of C4 on C5, C5 on C6, C6 on C7, likely facet mediated. Exaggerated cervical lordosis. No traumatic subluxation. Skull base and vertebrae: No acute fracture. The dens and skull base are intact. Soft tissues and spinal canal: No prevertebral fluid or swelling. No visible canal hematoma. Disc levels: Diffuse disc space narrowing and endplate spurring. Slight progression of  degenerative disc disease at C6-C7 since prior exam. Multilevel facet hypertrophy. Upper chest: Negative. Other: Carotid calcifications. IMPRESSION: 1.  No acute intracranial abnormality.  No skull fracture. 2. Multilevel degenerative change throughout the cervical spine without acute fracture. Electronically Signed   By: Keith Rake M.D.   On: 06/09/2018 00:18   Ct Cervical Spine Wo Contrast  Result Date: 06/09/2018 CLINICAL DATA:  Unwitnessed fall in the shower. Struck back of head. EXAM: CT HEAD WITHOUT CONTRAST CT CERVICAL SPINE WITHOUT CONTRAST TECHNIQUE: Multidetector CT imaging of the head and cervical spine was performed following the standard protocol without intravenous contrast. Multiplanar CT image reconstructions of the cervical spine were also generated. COMPARISON:  Cervical spine CT 12/04/2014, head CT 10/07/2013 FINDINGS: CT HEAD FINDINGS Brain: No intracranial hemorrhage, mass effect, or midline shift. Unchanged degree of atrophy and chronic small vessel ischemia. Unchanged 7 mm slightly hyperattenuating subependymal nodule in the lateral body of the left lateral ventricle. No hydrocephalus. The basilar cisterns are patent. No evidence of territorial infarct or acute ischemia. No extra-axial or intracranial fluid collection. Vascular: Atherosclerosis of skullbase vasculature without hyperdense vessel or abnormal calcification. Skull: No fracture or focal lesion. Sinuses/Orbits: No acute finding.  Prior right cataract resection. Other: None. CT CERVICAL SPINE FINDINGS Alignment: Unchanged from prior exam with mild anterolisthesis of C4 on C5, C5 on C6, C6 on C7, likely facet mediated. Exaggerated cervical lordosis. No traumatic subluxation. Skull base and vertebrae: No acute fracture. The dens and skull base are intact. Soft tissues and spinal canal: No prevertebral fluid or swelling. No visible canal hematoma. Disc levels: Diffuse disc space  narrowing and endplate spurring. Slight  progression of degenerative disc disease at C6-C7 since prior exam. Multilevel facet hypertrophy. Upper chest: Negative. Other: Carotid calcifications. IMPRESSION: 1.  No acute intracranial abnormality.  No skull fracture. 2. Multilevel degenerative change throughout the cervical spine without acute fracture. Electronically Signed   By: Keith Rake M.D.   On: 06/09/2018 00:18   Dg Shoulder Left  Result Date: 06/09/2018 CLINICAL DATA:  Found on floor in shower, fall, LEFT shoulder pain EXAM: LEFT SHOULDER - 2+ VIEW COMPARISON:  None FINDINGS: Diffuse osseous demineralization. AC joint alignment normal. Displaced fracture at surgical neck LEFT humerus. No dislocation. No additional fractures identified. IMPRESSION: Displaced fracture at surgical neck of LEFT humerus. Electronically Signed   By: Lavonia Dana M.D.   On: 06/09/2018 00:57    EKG:   Orders placed or performed during the hospital encounter of 06/08/18  . ED EKG  . ED EKG  . EKG 12-Lead  . EKG 12-Lead    ASSESSMENT AND PLAN:    This is a 84 year old female admitted for multiple fractures. 1.  Humerus fracture: Closed MI: Left arm.   Dr. Harlow Mares has recommended conservative management and sling no surgical interventions  severe pain with IV morphine as needed.  Hyponatremia clinically improving sodium at 127 continue IV fluids and recheck in a.m.  Hypokalemia repleted  Hypomagnesemia replete and recheck    Compression fracture: T10; plan as above    Hypertension: Stable for age; continue ,amlodipine and losartan and hydralazine    Crohn's disease: Stable; continue balsalazide    DVT prophylaxis: lovenox    GI prophylaxis: None  The patient is a full code.      All the records are reviewed and case discussed with Care Management/Social Workerr. Management plans discussed with the patient,  Grand daughter caitlin  and they are in agreement.  CODE STATUS: DNR  TOTAL TIME TAKING CARE OF THIS PATIENT: 43 minutes.    POSSIBLE D/C IN 1-2  DAYS, DEPENDING ON CLINICAL CONDITION.  Note: This dictation was prepared with Dragon dictation along with smaller phrase technology. Any transcriptional errors that result from this process are unintentional.   Nicholes Mango M.D on 06/09/2018 at 1:49 PM  Between 7am to 6pm - Pager - [phone] After 6pm go to [email or web address] - password [email or web address] [facility]  Office  [phone]  CC: Primary care physician; Venia Carbon, MD

## 2018-06-09 NOTE — Clinical Social Work Placement (Signed)
   CLINICAL SOCIAL WORK PLACEMENT  NOTE  Date:  06/09/2018  Patient Details  Name: Sabrina Hall MRN: [id number] Date of Birth: 12-03-1922  Clinical Social Work is seeking post-discharge placement for this patient at the [place] level of care (*CSW will initial, date and re-position this form in  chart as items are completed):  Yes   Patient/family provided with [facility] Work Department's list of facilities offering this level of care within the geographic area requested by the patient (or if unable, by the patient's family).  Yes   Patient/family informed of their freedom to choose among providers that offer the needed level of care, that participate in Medicare, Medicaid or managed care program needed by the patient, have an available bed and are willing to accept the patient.  Yes   Patient/family informed of [facility]'s ownership interest in [facility] and [facility], as well as of the fact that they are under no obligation to receive care at these facilities.  PASRR submitted to EDS on       PASRR number received on       Existing PASRR number confirmed on 06/09/18     FL2 transmitted to all facilities in geographic area requested by pt/family on 06/09/18     FL2 transmitted to all facilities within larger geographic area on       Patient informed that his/her managed care company has contracts with or will negotiate with certain facilities, including the following:        Yes   Patient/family informed of bed offers received.  Patient chooses bed at [facility] )     Physician recommends and patient chooses bed at      Patient to be transferred to   on  .  Patient to be transferred to facility by       Patient family notified on   of transfer.  Name of family member notified:        PHYSICIAN       Additional Comment:    _______________________________________________ Lanetta Figuero, Veronia Beets, LCSW 06/09/2018,  2:29 PM

## 2018-06-09 NOTE — Consult Note (Signed)
ORTHOPAEDIC CONSULTATION  REQUESTING PHYSICIAN: Nicholes Mango, MD  Chief Complaint: left shoulder pain  HPI: Sabrina Hall is a 84 y.o. female who complains of left shoulder pain. Please see ED notes and H&P for details  Past Medical History:  Diagnosis Date  . Arthritis   . Cataract   . Crohn's   . Deviated septum   . Eczema   . H/O seasonal allergies   . History of hemorrhoids   . History of shingles   . Hypertension   . Psoriasis   . Vitamin B12 deficiency    Past Surgical History:  Procedure Laterality Date  . ABDOMINAL HYSTERECTOMY    . basel cell carcinoma    . CATARACT EXTRACTION     right eye  . CHOLECYSTECTOMY    . HYSTERECTOMY    . ILEOCECOSTOMY    . MOHS SURGERY    . NASAL SEPTUM SURGERY    . NECK SURGERY  2004   mass removed from cervical spine  . NOSE SURGERY     basal cell carcinoma of nose   Social History   Socioeconomic History  . Marital status: Widowed    Spouse name: Not on file  . Number of children: 2  . Years of education: Not on file  . Highest education level: Not on file  Occupational History  . 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 Financial resource strain: Not on file  . Food insecurity:    Worry: Not on file    Inability: Not on file  . Transportation needs:    Medical: Not on file    Non-medical: Not on file  Tobacco Use  . Smoking status: Former Smoker    Packs/day: 0.50    Types: Cigarettes    Last attempt to quit: 09/09/1981    Years since quitting: 36.7  . Smokeless tobacco: Never Used  Substance and Sexual Activity  . Alcohol use: Yes    Comment: occassionally  . Drug use: No  . Sexual activity: Not on file  Lifestyle  . Physical activity:    Days per week: Not on file    Minutes per session: Not on file  . Stress: Not on file  Relationships  . Social connections:    Talks on phone: Not on file    Gets together: Not on file    Attends religious service: Not on file     Active member of club or organization: Not on file    Attends meetings of clubs or organizations: Not on file    Relationship status: Not on file  Other Topics Concern  . Not on file  Social History Narrative   Has living will    Daughter is health care POA   Has DNR order--this is confirmed at visit 12/07/15   No tube feeds if cognitively unaware   Family History  Problem Relation Age of Onset  . Stroke Father   . Cancer Neg Hx   . Diabetes Neg Hx    Allergies  Allergen Reactions  . Lactose Dermatitis  . Maxzide [Hydrochlorothiazide W-Triamterene] Other (See Comments)    causes electrolyte disturbance  . Nsaids Other (See Comments)    Reaction: unknown  . Sulfonamide Derivatives Other (See Comments)    Reaction: unknown  . Penicillins Rash and Other (See Comments)    Reaction pulled from Care Everywhere Has patient had a PCN reaction causing immediate rash, facial/tongue/throat swelling, SOB or lightheadedness with hypotension: Unknown Has  patient had a PCN reaction causing severe rash involving mucus membranes or skin necrosis: Unknown Has patient had a PCN reaction that required hospitalization: Unknown Has patient had a PCN reaction occurring within the last 10 years: Unknown If all of the above answers are "NO", then may proceed with Cephalosporin use.    Prior to Admission medications   Medication Sig Start Date End Date Taking? Authorizing Provider  acetaminophen (TYLENOL) 325 MG tablet Take 325-650 mg by mouth every 4 (four) hours as needed for fever (general discomfort).   Yes [provider]  acetaminophen (TYLENOL) 500 MG tablet Take 500 mg by mouth 2 (two) times daily.   Yes [provider]  alum & mag hydroxide-simeth (MAALOX/MYLANTA) 200-200-20 MG/5ML suspension Take 30 mLs by mouth every 4 (four) hours as needed for indigestion or flatulence (upset stomach).   Yes [provider]  amLODipine (NORVASC) 5 MG tablet TAKE ONE TABLET BY  MOUTH TWICE DAILY 08/27/15  Yes Gollan, Kathlene November, MD  aspirin 81 MG tablet Take 81 mg by mouth daily.   Yes [provider]  balsalazide (COLAZAL) 750 MG capsule Take 1,500 mg by mouth 2 (two) times daily.   Yes [provider]  bismuth subsalicylate (PEPTO BISMOL) 262 MG/15ML suspension Take 10 mLs by mouth 3 (three) times daily as needed for diarrhea or loose stools.   Yes [provider]  carbamide peroxide (DEBROX) 6.5 % OTIC solution Place 5 drops into both ears daily as needed (cerumen impaction). Instill x5 days   Yes [provider]  Cyanocobalamin (VITAMIN B-12) 500 MCG SUBL Place 500 mcg under the tongue daily.   Yes [provider]  cycloSPORINE (RESTASIS) 0.05 % ophthalmic emulsion Place 1 drop into both eyes 2 (two) times daily.   Yes [provider]  cycloSPORINE (RESTASIS) 0.05 % ophthalmic emulsion Place 1 drop into both eyes 2 (two) times daily as needed (dry eyes).   Yes [provider]  dextromethorphan-guaiFENesin (ROBITUSSIN-DM) 10-100 MG/5ML liquid Take 10 mLs by mouth every 4 (four) hours as needed for cough.   Yes [provider]  diazepam (VALIUM) 5 MG tablet Take 2.5 mg by mouth every 12 (twelve) hours as needed for anxiety.    Yes [provider]  dicyclomine (BENTYL) 10 MG capsule Take 10 mg by mouth 4 (four) times daily as needed (diarrhea/ abdominal pain).   Yes [provider]  diphenhydrAMINE (BENADRYL) 25 mg capsule Take 25 mg by mouth every 4 (four) hours as needed for itching or allergies.   Yes [provider]  fish oil-omega-3 fatty acids 1000 MG capsule Take 1 g by mouth 2 (two) times daily.    Yes [provider]  hydrALAZINE (APRESOLINE) 25 MG tablet TAKE ONE TABLET BY MOUTH TWICE DAILY 08/27/15  Yes Gollan, Kathlene November, MD  lactase (LACTAID) 3000 units tablet Take 6,000 Units by mouth 3 (three) times daily with meals.   Yes [provider]   latanoprost (XALATAN) 0.005 % ophthalmic solution Place 1 drop into both eyes at bedtime. 03/23/17  Yes [provider]  loperamide (IMODIUM) 2 MG capsule Take 4 mg by mouth 2 (two) times daily as needed for diarrhea or loose stools.    Yes [provider]  losartan (COZAAR) 100 MG tablet Take 100 mg by mouth daily.   Yes [provider]  magnesium hydroxide (MILK OF MAGNESIA) 400 MG/5ML suspension Take 30 mLs by mouth daily as needed for mild constipation.   Yes  [provider]  magnesium oxide (MAG-OX) 400 MG tablet Take 200 mg by mouth daily.    Yes [provider]  Melatonin 3 MG TABS Take 1 tablet by mouth at bedtime.   Yes [provider]  Multiple Vitamin (MULTIVITAMIN) tablet Take 1 tablet by mouth daily.     Yes [provider]  nystatin (NYSTATIN) powder Apply 1 g topically 2 (two) times daily as needed (rash).   Yes [provider]  ondansetron (ZOFRAN) 4 MG tablet Take 4 mg by mouth every 8 (eight) hours as needed for nausea or vomiting.   Yes [provider]  sertraline (ZOLOFT) 25 MG tablet Take 1 tablet (25 mg total) by mouth daily. 05/27/18  Yes Venia Carbon, MD  traMADol (ULTRAM) 50 MG tablet Take 1 tablet by mouth every 8 (eight) hours as needed for moderate pain.  12/03/17  Yes [provider]   Dg Thoracic Spine 2 View  Result Date: 06/09/2018 CLINICAL DATA:  Found on floor in shower, fall, hit back of head and LEFT shoulder, LEFT shoulder pain EXAM: THORACIC SPINE 2 VIEWS COMPARISON:  None Correlation: Abdominal radiographs 10/07/2014 FINDINGS: Osseous demineralization. 13 pairs of ribs. Superior endplate compression fracture of a lower thoracic vertebra, likely T10, with 50% anterior height loss; this is not identified on prior abdominal radiographs. No additional fracture, dislocation, or bone destruction. IMPRESSION: Superior endplate compression fracture of approximately T10 vertebral  body with 50% anterior height loss. Electronically Signed   By: Lavonia Dana M.D.   On: 06/09/2018 01:00   Ct Head Wo Contrast  Result Date: 06/09/2018 CLINICAL DATA:  Unwitnessed fall in the shower. Struck back of head. EXAM: CT HEAD WITHOUT CONTRAST CT CERVICAL SPINE WITHOUT CONTRAST TECHNIQUE: Multidetector CT imaging of the head and cervical spine was performed following the standard protocol without intravenous contrast. Multiplanar CT image reconstructions of the cervical spine were also generated. COMPARISON:  Cervical spine CT 12/04/2014, head CT 10/07/2013 FINDINGS: CT HEAD FINDINGS Brain: No intracranial hemorrhage, mass effect, or midline shift. Unchanged degree of atrophy and chronic small vessel ischemia. Unchanged 7 mm slightly hyperattenuating subependymal nodule in the lateral body of the left lateral ventricle. No hydrocephalus. The basilar cisterns are patent. No evidence of territorial infarct or acute ischemia. No extra-axial or intracranial fluid collection. Vascular: Atherosclerosis of skullbase vasculature without hyperdense vessel or abnormal calcification. Skull: No fracture or focal lesion. Sinuses/Orbits: No acute finding.  Prior right cataract resection. Other: None. CT CERVICAL SPINE FINDINGS Alignment: Unchanged from prior exam with mild anterolisthesis of C4 on C5, C5 on C6, C6 on C7, likely facet mediated. Exaggerated cervical lordosis. No traumatic subluxation. Skull base and vertebrae: No acute fracture. The dens and skull base are intact. Soft tissues and spinal canal: No prevertebral fluid or swelling. No visible canal hematoma. Disc levels: Diffuse disc space narrowing and endplate spurring. Slight progression of degenerative disc disease at C6-C7 since prior exam. Multilevel facet hypertrophy. Upper chest: Negative. Other: Carotid calcifications. IMPRESSION: 1.  No acute intracranial abnormality.  No skull fracture. 2. Multilevel degenerative change throughout the cervical  spine without acute fracture. Electronically Signed   By: Keith Rake M.D.   On: 06/09/2018 00:18   Ct Cervical Spine Wo Contrast  Result Date: 06/09/2018 CLINICAL DATA:  Unwitnessed fall in the shower. Struck back of head. EXAM: CT HEAD WITHOUT CONTRAST CT CERVICAL SPINE WITHOUT CONTRAST TECHNIQUE: Multidetector CT imaging of the head and cervical spine was performed following the standard protocol without  intravenous contrast. Multiplanar CT image reconstructions of the cervical spine were also generated. COMPARISON:  Cervical spine CT 12/04/2014, head CT 10/07/2013 FINDINGS: CT HEAD FINDINGS Brain: No intracranial hemorrhage, mass effect, or midline shift. Unchanged degree of atrophy and chronic small vessel ischemia. Unchanged 7 mm slightly hyperattenuating subependymal nodule in the lateral body of the left lateral ventricle. No hydrocephalus. The basilar cisterns are patent. No evidence of territorial infarct or acute ischemia. No extra-axial or intracranial fluid collection. Vascular: Atherosclerosis of skullbase vasculature without hyperdense vessel or abnormal calcification. Skull: No fracture or focal lesion. Sinuses/Orbits: No acute finding.  Prior right cataract resection. Other: None. CT CERVICAL SPINE FINDINGS Alignment: Unchanged from prior exam with mild anterolisthesis of C4 on C5, C5 on C6, C6 on C7, likely facet mediated. Exaggerated cervical lordosis. No traumatic subluxation. Skull base and vertebrae: No acute fracture. The dens and skull base are intact. Soft tissues and spinal canal: No prevertebral fluid or swelling. No visible canal hematoma. Disc levels: Diffuse disc space narrowing and endplate spurring. Slight progression of degenerative disc disease at C6-C7 since prior exam. Multilevel facet hypertrophy. Upper chest: Negative. Other: Carotid calcifications. IMPRESSION: 1.  No acute intracranial abnormality.  No skull fracture. 2. Multilevel degenerative change throughout the  cervical spine without acute fracture. Electronically Signed   By: Keith Rake M.D.   On: 06/09/2018 00:18   Dg Shoulder Left  Result Date: 06/09/2018 CLINICAL DATA:  Found on floor in shower, fall, LEFT shoulder pain EXAM: LEFT SHOULDER - 2+ VIEW COMPARISON:  None FINDINGS: Diffuse osseous demineralization. AC joint alignment normal. Displaced fracture at surgical neck LEFT humerus. No dislocation. No additional fractures identified. IMPRESSION: Displaced fracture at surgical neck of LEFT humerus. Electronically Signed   By: Lavonia Dana M.D.   On: 06/09/2018 00:57    Positive ROS: All other systems have been reviewed and were otherwise negative with the exception of those mentioned in the HPI and as above.  Physical Exam: General: Alert, no acute distress Cardiovascular: No pedal edema Respiratory: No cyanosis, no use of accessory musculature GI: No organomegaly, abdomen is soft and non-tender Skin: No lesions in the area of chief complaint Neurologic: Sensation intact distally Psychiatric: Patient is competent for consent with normal mood and affect Lymphatic: No axillary or cervical lymphadenopathy  MUSCULOSKELETAL: RUE FROM and nontender, LUE with proximal swelling, tenderness. Elbow/wrist and hand with FROM, non-tender 2+radial pulse  Assessment: Proximal humerus fracture  Plan: Patient examined and x-rays reviewed. Recommend conservative treatment with sling and close follow-up. Elbow, wrist and hand ROM may start today. Will see patient in the office in 10 to 14 days.    Lovell Sheehan, MD    06/09/2018 11:55 AM

## 2018-06-09 NOTE — Consult Note (Signed)
Pharmacy consulted to manage electrolytes in  This 84 year old female w/ a  Humerus fracture and K of 2.7 Mg 1.6 Pt has received 80 MEQ of KCL po and is currently receiving NS w/ 40 KCL @ 111m/min (4 MEQ/hr). Repeat K at 1300 is 4.2 Mg 2g IV was given Repeat Mg level in the AM IV fluids have been stopped Recheck K in the AM  MR[employer] Pharm.D, BCPS Clinical Pharmacist

## 2018-06-10 LAB — MAGNESIUM: Magnesium: 1.7 mg/dL (ref 1.7–2.4)

## 2018-06-10 LAB — CBC
HEMATOCRIT: 29.9 % — AB (ref 35.0–47.0)
HEMOGLOBIN: 10.1 g/dL — AB (ref 12.0–16.0)
MCH: 27.8 pg (ref 26.0–34.0)
MCHC: 33.9 g/dL (ref 32.0–36.0)
MCV: 82 fL (ref 80.0–100.0)
Platelets: 196 10*3/uL (ref 150–440)
RBC: 3.65 MIL/uL — ABNORMAL LOW (ref 3.80–5.20)
RDW: 14 % (ref 11.5–14.5)
WBC: 8.9 10*3/uL (ref 3.6–11.0)

## 2018-06-10 LAB — BASIC METABOLIC PANEL
Anion gap: 8 (ref 5–15)
BUN: 11 mg/dL (ref 8–23)
CHLORIDE: 91 mmol/L — AB (ref 98–111)
CO2: 27 mmol/L (ref 22–32)
Calcium: 8.1 mg/dL — ABNORMAL LOW (ref 8.9–10.3)
Creatinine, Ser: 0.57 mg/dL (ref 0.44–1.00)
GFR calc Af Amer: >60 mL/min (ref >60)
GFR calc non Af Amer: >60 mL/min (ref >60)
GLUCOSE: 115 mg/dL — AB (ref 70–99)
Potassium: 3.3 mmol/L — ABNORMAL LOW (ref 3.5–5.1)
Sodium: 126 mmol/L — ABNORMAL LOW (ref 135–145)

## 2018-06-10 MED ORDER — SODIUM CHLORIDE 1 G PO TABS: 1.0000 g | ORAL_TABLET | Freq: Two times a day (BID) | ORAL | Status: DC

## 2018-06-10 MED ORDER — MAGNESIUM OXIDE 400 (241.3 MG) MG PO TABS
400.0000 mg | ORAL_TABLET | Freq: Every day | ORAL | Status: DC
  Administered 2018-06-10 – 2018-06-12 (×3): 400 mg via ORAL
  Filled 2018-06-10 (×3): qty 1

## 2018-06-10 MED ORDER — POTASSIUM CHLORIDE CRYS ER 20 MEQ PO TBCR
40.0000 meq | EXTENDED_RELEASE_TABLET | Freq: Once | ORAL | Status: AC
  Administered 2018-06-10: 40 meq via ORAL
  Filled 2018-06-10: qty 2

## 2018-06-10 MED ORDER — SODIUM CHLORIDE 1 G PO TABS
1.0000 g | ORAL_TABLET | Freq: Three times a day (TID) | ORAL | Status: DC
  Administered 2018-06-10 – 2018-06-12 (×7): 1 g via ORAL
  Filled 2018-06-10 (×8): qty 1

## 2018-06-10 NOTE — Progress Notes (Signed)
PT Cancellation Note  Patient Details Name: Sabrina Hall MRN: [id number] DOB: October 11, 1922   Cancelled Treatment:    Reason Eval/Treat Not Completed: Other (comment).  PT consult received.  Chart reviewed.  Pt noted with L humerus fx and T10 compression fx--discussed with MD Harlow Mares who reported upon his initial consult, he recommended neuro (surgery?) consult to be placed to address T10 compression fx.  Nurse notified.  Will hold PT at this time until recommendations (including any precautions/bracing needs) are noted for T10 compression fx.  Leitha Bleak, PT 06/10/18, 2:06 PM [phone]

## 2018-06-10 NOTE — Progress Notes (Signed)
MD Gouru notified that physical therapy cancelled therapy session this afternoon because likely of a  T10 compression fx? MD Gouru notified that MD Harlow Mares recommends neuro consult. MD Gouru will follow up.

## 2018-06-10 NOTE — Progress Notes (Signed)
OT Cancellation Note  Patient Details Name: Sabrina Hall MRN: [id number] DOB: 1923-03-27   Cancelled Treatment:    Reason Eval/Treat Not Completed: Medical issues which prohibited therapy. OT consult received.  Chart reviewed.  Pt noted with L humerus fx and T10 compression fx. Per PT discussion with MD Harlow Mares, he recommended neuro (surgery?) consult to be placed to address T10 compression fx.  Nurse notified.  Will hold OT at this time until recommendations (including any precautions/bracing needs) are noted for T10 compression fx.  Jeni Salles, MPH, MS, OTR/L ascom [phone] 06/10/18, 2:40 PM

## 2018-06-10 NOTE — Consult Note (Signed)
Pharmacy consulted to manage electrolytes in  This 84 year old female w/ a  Humerus fracture and K of 2.7 Mg 1.6 on admission. This AM K=3.3 and Mg=1.7 I will give 40 MEQ po of KCL and increase pt home dose of MagOx to 468m daily. Recheck K in the AM if pt still here  MRamond Dial Pharm.D, BCPS Clinical Pharmacist

## 2018-06-10 NOTE — Consult Note (Signed)
Referring Physician:  No referring provider defined for this encounter.  Primary Physician:  Venia Carbon, MD  Chief Complaint:  T10 compression fracture  History of Present Illness: 06/10/2018 Sabrina Hall is a 84 y.o. female who presents with the chief complaint of possible T10 compression fracture.   She suffered a fall yesterday and was admitted to the hospitalist service.  She was found to have a humerus fracture.  Workup revealed thoracic compression deformity, prompting neurosurgery consultation.    She has no pain currently in her back. She denies tingling, numbness, changes in sensation, weakness.  She only complains of L arm pain.      Review of Systems:  A 10 point review of systems is negative, except for the pertinent positives and negatives detailed in the HPI.  Past Medical History: Past Medical History:  Diagnosis Date  . Arthritis   . Cataract   . Crohn's   . Deviated septum   . Eczema   . H/O seasonal allergies   . History of hemorrhoids   . History of shingles   . Hypertension   . Psoriasis   . Vitamin B12 deficiency     Past Surgical History: Past Surgical History:  Procedure Laterality Date  . ABDOMINAL HYSTERECTOMY    . basel cell carcinoma    . CATARACT EXTRACTION     right eye  . CHOLECYSTECTOMY    . HYSTERECTOMY    . ILEOCECOSTOMY    . MOHS SURGERY    . NASAL SEPTUM SURGERY    . NECK SURGERY  2004   mass removed from cervical spine  . NOSE SURGERY     basal cell carcinoma of nose    Allergies: Allergies as of 06/08/2018 - Review Complete 06/08/2018  Allergen Reaction Noted  . Lactose Dermatitis   . Maxzide [hydrochlorothiazide w-triamterene] Other (See Comments) 03/23/2012  . Nsaids Other (See Comments)   . Sulfonamide derivatives Other (See Comments)   . Penicillins Rash and Other (See Comments) 03/23/2012    Medications:  Current Facility-Administered Medications:  .  acetaminophen (TYLENOL) tablet 650 mg,  650 mg, Oral, Q6H PRN **OR** acetaminophen (TYLENOL) suppository 650 mg, 650 mg, Rectal, Q6H PRN, Harrie Foreman, MD .  alum & mag hydroxide-simeth (MAALOX/MYLANTA) 200-200-20 MG/5ML suspension 30 mL, 30 mL, Oral, Q4H PRN, Harrie Foreman, MD .  amLODipine (NORVASC) tablet 5 mg, 5 mg, Oral, BID, Harrie Foreman, MD, 5 mg at 06/10/18 1034 .  aspirin EC tablet 81 mg, 81 mg, Oral, Daily, Harrie Foreman, MD, 81 mg at 06/10/18 1035 .  balsalazide (COLAZAL) capsule 1,500 mg, 1,500 mg, Oral, BID, Harrie Foreman, MD, 1,500 mg at 06/09/18 2256 .  carbamide peroxide (DEBROX) 6.5 % OTIC (EAR) solution 5 drop, 5 drop, Both EARS, Daily PRN, Harrie Foreman, MD .  cycloSPORINE (RESTASIS) 0.05 % ophthalmic emulsion 1 drop, 1 drop, Both Eyes, BID PRN, Harrie Foreman, MD, 1 drop at 06/10/18 1036 .  diazepam (VALIUM) tablet 2.5 mg, 2.5 mg, Oral, Q12H PRN, Harrie Foreman, MD .  dicyclomine (BENTYL) capsule 10 mg, 10 mg, Oral, QID PRN, Harrie Foreman, MD .  diphenhydrAMINE (BENADRYL) capsule 25 mg, 25 mg, Oral, Q4H PRN, Harrie Foreman, MD .  docusate sodium (COLACE) capsule 100 mg, 100 mg, Oral, BID, Harrie Foreman, MD, 100 mg at 06/09/18 2055 .  enoxaparin (LOVENOX) injection 40 mg, 40 mg, Subcutaneous, QHS, Gouru, Aruna, MD, 40 mg at 06/09/18 2055 .  guaiFENesin-dextromethorphan (ROBITUSSIN DM) 100-10 MG/5ML syrup 5 mL, 5 mL, Oral, Q4H PRN, Harrie Foreman, MD .  hydrALAZINE (APRESOLINE) tablet 25 mg, 25 mg, Oral, BID, Harrie Foreman, MD, 25 mg at 06/10/18 1033 .  lactase (LACTAID) tablet 6,000 Units, 6,000 Units, Oral, TID WC, Harrie Foreman, MD, 6,000 Units at 06/10/18 1213 .  latanoprost (XALATAN) 0.005 % ophthalmic solution 1 drop, 1 drop, Both Eyes, QHS, Harrie Foreman, MD, 1 drop at 06/09/18 2100 .  losartan (COZAAR) tablet 100 mg, 100 mg, Oral, Daily, Harrie Foreman, MD, 100 mg at 06/10/18 1034 .  magnesium hydroxide (MILK OF MAGNESIA) suspension 30  mL, 30 mL, Oral, Daily PRN, Harrie Foreman, MD .  magnesium oxide (MAG-OX) tablet 400 mg, 400 mg, Oral, Daily, Maccia, Melissa D, RPH, 400 mg at 06/10/18 1035 .  Melatonin TABS 5 mg, 5 mg, Oral, QHS, Harrie Foreman, MD, 5 mg at 06/09/18 2101 .  multivitamin with minerals tablet 1 tablet, 1 tablet, Oral, Daily, Harrie Foreman, MD, 1 tablet at 06/10/18 1033 .  nystatin (MYCOSTATIN/NYSTOP) topical powder 1 g, 1 g, Topical, BID PRN, Harrie Foreman, MD .  omega-3 acid ethyl esters (LOVAZA) capsule 1 g, 1 g, Oral, BID, Harrie Foreman, MD, 1 g at 06/09/18 2055 .  ondansetron (ZOFRAN) tablet 4 mg, 4 mg, Oral, Q6H PRN **OR** ondansetron (ZOFRAN) injection 4 mg, 4 mg, Intravenous, Q6H PRN, Harrie Foreman, MD .  potassium chloride SA (K-DUR,KLOR-CON) CR tablet 40 mEq, 40 mEq, Oral, Once, Ramond Dial, RPH .  sertraline (ZOLOFT) tablet 25 mg, 25 mg, Oral, Daily, Harrie Foreman, MD, 25 mg at 06/10/18 1034 .  sodium chloride tablet 1 g, 1 g, Oral, TID WC, Gouru, Aruna, MD, 1 g at 06/10/18 1213 .  traMADol (ULTRAM) tablet 50 mg, 50 mg, Oral, Q8H PRN, Harrie Foreman, MD, 50 mg at 06/09/18 2055 .  vitamin B-12 (CYANOCOBALAMIN) tablet 500 mcg, 500 mcg, Oral, Daily, Harrie Foreman, MD, 500 mcg at 06/10/18 1035   Social History: Social History   Tobacco Use  . Smoking status: Former Smoker    Packs/day: 0.50    Types: Cigarettes    Last attempt to quit: 09/09/1981    Years since quitting: 36.7  . Smokeless tobacco: Never Used  Substance Use Topics  . Alcohol use: Yes    Comment: occassionally  . Drug use: No    Family Medical History: Family History  Problem Relation Age of Onset  . Stroke Father   . Cancer Neg Hx   . Diabetes Neg Hx     Physical Examination: Vitals:   06/09/18 2340 06/10/18 0747  BP: 121/70 (!) 124/51  Pulse: 76 72  Resp: 18 18  Temp: 98 F (36.7 C) 98.6 F (37 C)  SpO2: 94% 97%     General: Patient is well developed, well  nourished, calm, collected, and in no apparent distress.  Psychiatric: Patient is non-anxious.  Head:  Pupils equal, round, and reactive to light.  ENT:  Oral mucosa appears well hydrated.  Neck:   Supple.  Full range of motion.  Respiratory: Patient is breathing without any difficulty.  Extremities: No edema.  Vascular: Palpable pulses in dorsal pedal vessels.  Skin:   On exposed skin, there are no abnormal skin lesions.  NEUROLOGICAL:  General: In no acute distress.   Awake, alert, oriented to person, place, and month but not year.  Pupils equal round and reactive to light.  Facial tone is symmetric.  Tongue protrusion is midline.  There is no pronator drift.  ROM of spine: diminished due to kyphosis.  Palpation of spine: nontender despite significant percussion and palpation along the entire spine.   L arm exam limited by fractured humerus.  Strength: Side Biceps Triceps Deltoid Interossei Grip Wrist Ext. Wrist Flex.  R 5 5 5 5 5 5 5   L - - - 5 5 5 5    Side Iliopsoas Quads Hamstring PF DF EHL  R 5 5 5 5 5 5   L 5 5 5 5 5 5    Reflexes are 1+ and symmetric at the biceps, triceps, brachioradialis, patella and achilles.   Bilateral upper and lower extremity sensation is intact to light touch and pin prick.  Clonus is not present.  Toes are down-going.  Gait is abnormal, stooped posture.   Hoffman's is absent.  Imaging: T spine xrays 06/09/2018 IMPRESSION: Superior endplate compression fracture of approximately T10 vertebral body with 50% anterior height loss.   Electronically Signed   By: Lavonia Dana M.D.   On: 06/09/2018 01:00  I have personally reviewed the images and agree with the above interpretation.  Assessment and Plan: Sabrina Hall is a pleasant 84 y.o. female with T10 compression abnormality of unknown chronicity.  It was not present in 2016.  She does not have any tenderness to palpation of her spine, so I suspect that this is a chronic compression  fracture.    I would recommend against a brace unless she begins complaining of pain when ambulating or bearing weight.  If she continues to deny back pain, I would not recommend a brace.  If a brace is ultimately felt to be appropriate, please get a TLSO.   Either way, she should follow up in clinic at [facility] clinic in 2-4 weeks to take xrays.   Laverta Harnisch K. Izora Ribas MD, [place] [facility]

## 2018-06-10 NOTE — Progress Notes (Signed)
[facility] [place] NAME: Sabrina Hall    MR#:  [id number]  DATE OF BIRTH:  09-Jul-1923  SUBJECTIVE:  CHIEF COMPLAINT:  Pt is resting ok , no complaints, no family at bed side  REVIEW OF SYSTEMS:  ROS - Unobtainable  DRUG ALLERGIES:   Allergies  Allergen Reactions  . Lactose Dermatitis  . Maxzide [Hydrochlorothiazide W-Triamterene] Other (See Comments)    causes electrolyte disturbance  . Nsaids Other (See Comments)    Reaction: unknown  . Sulfonamide Derivatives Other (See Comments)    Reaction: unknown  . Penicillins Rash and Other (See Comments)    Reaction pulled from Care Everywhere Has patient had a PCN reaction causing immediate rash, facial/tongue/throat swelling, SOB or lightheadedness with hypotension: Unknown Has patient had a PCN reaction causing severe rash involving mucus membranes or skin necrosis: Unknown Has patient had a PCN reaction that required hospitalization: Unknown Has patient had a PCN reaction occurring within the last 10 years: Unknown If all of the above answers are "NO", then may proceed with Cephalosporin use.     VITALS:  Blood pressure (!) 124/51, pulse 72, temperature 98.6 F (37 C), temperature source Oral, resp. rate 18, height 5' 1"  (1.549 m), weight 52 kg, SpO2 97 %.  PHYSICAL EXAMINATION:  GENERAL:  84 y.o.-year-old patient lying in the bed with no acute distress.  EYES: Pupils equal, round, reactive to light and accommodation. No scleral icterus. Extraocular muscles intact.  HEENT: Head atraumatic, normocephalic. Oropharynx and nasopharynx clear.  NECK:  Supple, no jugular venous distention. No thyroid enlargement, no tenderness.  LUNGS: Normal breath sounds bilaterally, no wheezing, rales,rhonchi or crepitation. No use of accessory muscles of respiration.  CARDIOVASCULAR: S1, S2 normal. No murmurs, rubs, or gallops.  ABDOMEN: Soft, nontender, nondistended. Bowel sounds  present. EXTREMITIES: Left shoulder is tender no pedal edema, cyanosis, or clubbing.  NEUROLOGIC: Awake and alert crania Sensation intact. Gait not checked.  PSYCHIATRIC: The patient is alert and disoriented SKIN: No obvious rash, lesion, or ulcer.    LABORATORY PANEL:   CBC Recent Labs  Lab 06/10/18 0518  WBC 8.9  HGB 10.1*  HCT 29.9*  PLT 196   ------------------------------------------------------------------------------------------------------------------  Chemistries  Recent Labs  Lab 06/09/18 0133  06/10/18 0518  NA 123*   < > 126*  K 2.7*   < > 3.3*  CL 87*  --  91*  CO2 26  --  27  GLUCOSE 129*  --  115*  BUN 16  --  11  CREATININE 0.71  --  0.57  CALCIUM 8.5*  --  8.1*  MG  --    < > 1.7  AST 35  --   --   ALT 25  --   --   ALKPHOS 55  --   --   BILITOT 0.6  --   --    < > = values in this interval not displayed.   ------------------------------------------------------------------------------------------------------------------  Cardiac Enzymes No results for input(s): TROPONINI in the last 168 hours. ------------------------------------------------------------------------------------------------------------------  RADIOLOGY:  Dg Thoracic Spine 2 View  Result Date: 06/09/2018 CLINICAL DATA:  Found on floor in shower, fall, hit back of head and LEFT shoulder, LEFT shoulder pain EXAM: THORACIC SPINE 2 VIEWS COMPARISON:  None Correlation: Abdominal radiographs 10/07/2014 FINDINGS: Osseous demineralization. 13 pairs of ribs. Superior endplate compression fracture of a lower thoracic vertebra, likely T10, with 50% anterior height loss; this is not identified on prior abdominal radiographs. No  additional fracture, dislocation, or bone destruction. IMPRESSION: Superior endplate compression fracture of approximately T10 vertebral body with 50% anterior height loss. Electronically Signed   By: Lavonia Dana M.D.   On: 06/09/2018 01:00   Ct Head Wo Contrast  Result  Date: 06/09/2018 CLINICAL DATA:  Unwitnessed fall in the shower. Struck back of head. EXAM: CT HEAD WITHOUT CONTRAST CT CERVICAL SPINE WITHOUT CONTRAST TECHNIQUE: Multidetector CT imaging of the head and cervical spine was performed following the standard protocol without intravenous contrast. Multiplanar CT image reconstructions of the cervical spine were also generated. COMPARISON:  Cervical spine CT 12/04/2014, head CT 10/07/2013 FINDINGS: CT HEAD FINDINGS Brain: No intracranial hemorrhage, mass effect, or midline shift. Unchanged degree of atrophy and chronic small vessel ischemia. Unchanged 7 mm slightly hyperattenuating subependymal nodule in the lateral body of the left lateral ventricle. No hydrocephalus. The basilar cisterns are patent. No evidence of territorial infarct or acute ischemia. No extra-axial or intracranial fluid collection. Vascular: Atherosclerosis of skullbase vasculature without hyperdense vessel or abnormal calcification. Skull: No fracture or focal lesion. Sinuses/Orbits: No acute finding.  Prior right cataract resection. Other: None. CT CERVICAL SPINE FINDINGS Alignment: Unchanged from prior exam with mild anterolisthesis of C4 on C5, C5 on C6, C6 on C7, likely facet mediated. Exaggerated cervical lordosis. No traumatic subluxation. Skull base and vertebrae: No acute fracture. The dens and skull base are intact. Soft tissues and spinal canal: No prevertebral fluid or swelling. No visible canal hematoma. Disc levels: Diffuse disc space narrowing and endplate spurring. Slight progression of degenerative disc disease at C6-C7 since prior exam. Multilevel facet hypertrophy. Upper chest: Negative. Other: Carotid calcifications. IMPRESSION: 1.  No acute intracranial abnormality.  No skull fracture. 2. Multilevel degenerative change throughout the cervical spine without acute fracture. Electronically Signed   By: Keith Rake M.D.   On: 06/09/2018 00:18   Ct Cervical Spine Wo  Contrast  Result Date: 06/09/2018 CLINICAL DATA:  Unwitnessed fall in the shower. Struck back of head. EXAM: CT HEAD WITHOUT CONTRAST CT CERVICAL SPINE WITHOUT CONTRAST TECHNIQUE: Multidetector CT imaging of the head and cervical spine was performed following the standard protocol without intravenous contrast. Multiplanar CT image reconstructions of the cervical spine were also generated. COMPARISON:  Cervical spine CT 12/04/2014, head CT 10/07/2013 FINDINGS: CT HEAD FINDINGS Brain: No intracranial hemorrhage, mass effect, or midline shift. Unchanged degree of atrophy and chronic small vessel ischemia. Unchanged 7 mm slightly hyperattenuating subependymal nodule in the lateral body of the left lateral ventricle. No hydrocephalus. The basilar cisterns are patent. No evidence of territorial infarct or acute ischemia. No extra-axial or intracranial fluid collection. Vascular: Atherosclerosis of skullbase vasculature without hyperdense vessel or abnormal calcification. Skull: No fracture or focal lesion. Sinuses/Orbits: No acute finding.  Prior right cataract resection. Other: None. CT CERVICAL SPINE FINDINGS Alignment: Unchanged from prior exam with mild anterolisthesis of C4 on C5, C5 on C6, C6 on C7, likely facet mediated. Exaggerated cervical lordosis. No traumatic subluxation. Skull base and vertebrae: No acute fracture. The dens and skull base are intact. Soft tissues and spinal canal: No prevertebral fluid or swelling. No visible canal hematoma. Disc levels: Diffuse disc space narrowing and endplate spurring. Slight progression of degenerative disc disease at C6-C7 since prior exam. Multilevel facet hypertrophy. Upper chest: Negative. Other: Carotid calcifications. IMPRESSION: 1.  No acute intracranial abnormality.  No skull fracture. 2. Multilevel degenerative change throughout the cervical spine without acute fracture. Electronically Signed   By: Keith Rake M.D.   On:  06/09/2018 00:18   Dg Shoulder  Left  Result Date: 06/09/2018 CLINICAL DATA:  Found on floor in shower, fall, LEFT shoulder pain EXAM: LEFT SHOULDER - 2+ VIEW COMPARISON:  None FINDINGS: Diffuse osseous demineralization. AC joint alignment normal. Displaced fracture at surgical neck LEFT humerus. No dislocation. No additional fractures identified. IMPRESSION: Displaced fracture at surgical neck of LEFT humerus. Electronically Signed   By: Lavonia Dana M.D.   On: 06/09/2018 00:57    EKG:   Orders placed or performed during the hospital encounter of 06/08/18  . ED EKG  . ED EKG  . EKG 12-Lead  . EKG 12-Lead    ASSESSMENT AND PLAN:    This is a 84 year old female admitted for multiple fractures. 1.  Humerus fracture: Closed MI: Left arm.   Dr. Harlow Mares has recommended conservative management and sling no surgical interventions  severe pain with IV morphine as needed. PT consult pending  T10 compression fracture on x-ray Pain management as needed Dr. Harlow Mares is recommending neurosurgery consult Consult neurosurgery  Hyponatremia clinically improving sodium at 127-126 Sodium chloride tablet and IV fluids and recheck  Hypokalemia repleted  Hypomagnesemia replete and recheck      Hypertension: Stable for age; continue ,amlodipine and losartan and hydralazine    Crohn's disease: Stable; continue balsalazide    DVT prophylaxis: lovenox    GI prophylaxis: None  The patient is a full code.      All the records are reviewed and case discussed with Care Management/Social Workerr. Management plans discussed with the patient,  Grand daughter caitlin  and they are in agreement.  CODE STATUS: DNR  TOTAL TIME TAKING CARE OF THIS PATIENT: 35  minutes.   POSSIBLE D/C IN 1-2  DAYS, DEPENDING ON CLINICAL CONDITION.  Note: This dictation was prepared with Dragon dictation along with smaller phrase technology. Any transcriptional errors that result from this process are unintentional.   Nicholes Mango M.D on 06/10/2018  at 3:26 PM  Between 7am to 6pm - Pager - [phone] After 6pm go to [email or web address] - password [email or web address] [facility]  Office  [phone]  CC: Primary care physician; Venia Carbon, MD

## 2018-06-11 LAB — BASIC METABOLIC PANEL
Anion gap: 8 (ref 5–15)
BUN: 15 mg/dL (ref 8–23)
CALCIUM: 8.2 mg/dL — AB (ref 8.9–10.3)
CO2: 25 mmol/L (ref 22–32)
Chloride: 92 mmol/L — ABNORMAL LOW (ref 98–111)
Creatinine, Ser: 0.56 mg/dL (ref 0.44–1.00)
Glucose, Bld: 108 mg/dL — ABNORMAL HIGH (ref 70–99)
Potassium: 3.9 mmol/L (ref 3.5–5.1)
SODIUM: 125 mmol/L — AB (ref 135–145)

## 2018-06-11 NOTE — Progress Notes (Signed)
[facility] [place] NAME: Sabrina Hall    MR#:  [id number]  DATE OF BIRTH:  24-Jul-1923  SUBJECTIVE:  CHIEF COMPLAINT:  Pt is resting ok , no complaints, no family at bed side  REVIEW OF SYSTEMS:  ROS - Unobtainable  DRUG ALLERGIES:   Allergies  Allergen Reactions  . Lactose Dermatitis  . Maxzide [Hydrochlorothiazide W-Triamterene] Other (See Comments)    causes electrolyte disturbance  . Nsaids Other (See Comments)    Reaction: unknown  . Sulfonamide Derivatives Other (See Comments)    Reaction: unknown  . Penicillins Rash and Other (See Comments)    Reaction pulled from Care Everywhere Has patient had a PCN reaction causing immediate rash, facial/tongue/throat swelling, SOB or lightheadedness with hypotension: Unknown Has patient had a PCN reaction causing severe rash involving mucus membranes or skin necrosis: Unknown Has patient had a PCN reaction that required hospitalization: Unknown Has patient had a PCN reaction occurring within the last 10 years: Unknown If all of the above answers are "NO", then may proceed with Cephalosporin use.     VITALS:  Blood pressure 126/87, pulse 92, temperature (!) 97.5 F (36.4 C), temperature source Oral, resp. rate 18, height 5' 1"  (1.549 m), weight 57.6 kg, SpO2 92 %.  PHYSICAL EXAMINATION:  GENERAL:  84 y.o.-year-old patient lying in the bed with no acute distress.  EYES: Pupils equal, round, reactive to light and accommodation. No scleral icterus. Extraocular muscles intact.  HEENT: Head atraumatic, normocephalic. Oropharynx and nasopharynx clear.  NECK:  Supple, no jugular venous distention. No thyroid enlargement, no tenderness.  LUNGS: Normal breath sounds bilaterally, no wheezing, rales,rhonchi or crepitation. No use of accessory muscles of respiration.  CARDIOVASCULAR: S1, S2 normal. No murmurs, rubs, or gallops.  ABDOMEN: Soft, nontender, nondistended. Bowel sounds  present. EXTREMITIES: Left shoulder is tender no pedal edema, cyanosis, or clubbing.  NEUROLOGIC: Awake and alert crania Sensation intact. Gait not checked.  PSYCHIATRIC: The patient is alert and disoriented SKIN: No obvious rash, lesion, or ulcer.    LABORATORY PANEL:   CBC Recent Labs  Lab 06/10/18 0518  WBC 8.9  HGB 10.1*  HCT 29.9*  PLT 196   ------------------------------------------------------------------------------------------------------------------  Chemistries  Recent Labs  Lab 06/09/18 0133  06/10/18 0518 06/11/18 0423  NA 123*   < > 126* 125*  K 2.7*   < > 3.3* 3.9  CL 87*  --  91* 92*  CO2 26  --  27 25  GLUCOSE 129*  --  115* 108*  BUN 16  --  11 15  CREATININE 0.71  --  0.57 0.56  CALCIUM 8.5*  --  8.1* 8.2*  MG  --    < > 1.7  --   AST 35  --   --   --   ALT 25  --   --   --   ALKPHOS 55  --   --   --   BILITOT 0.6  --   --   --    < > = values in this interval not displayed.   ------------------------------------------------------------------------------------------------------------------  Cardiac Enzymes No results for input(s): TROPONINI in the last 168 hours. ------------------------------------------------------------------------------------------------------------------  RADIOLOGY:  No results found.  EKG:   Orders placed or performed during the hospital encounter of 06/08/18  . ED EKG  . ED EKG  . EKG 12-Lead  . EKG 12-Lead    ASSESSMENT AND PLAN:    This is a 84 year old female  admitted for multiple fractures.  Hyponatremia  Baseline sodium 132 clinically improving sodium at 127-126-125 Sodium chloride tablet and IV fluids and recheck Nephrology consult placed  .  Humerus fracture: Closed MI: Left arm.   Dr. Harlow Mares has recommended conservative management and sling no surgical interventions  severe pain with IV morphine as needed. PT consult pending  T10 compression fracture on x-ray-unknown chronicity Pain  management as needed neurosurgery consult-recommend against brace unless the patient complains of pain when ambulating or bearing weight Recommending to follow-up with [facility] in 2 to 4 weeks to take repeat x-rays  Hyponatremia clinically improving sodium at 127-126 Sodium chloride tablet and IV fluids and recheck  Hypokalemia repleted  Hypomagnesemia repleted and recheck magnesium at 1.7      Hypertension: Stable for age; continue ,amlodipine and losartan and hydralazine    Crohn's disease: Stable; continue balsalazide    DVT prophylaxis: lovenox    GI prophylaxis: None  The patient is a full code.    Physical therapy is recommending skilled nursing facility  All the records are reviewed and case discussed with Care Management/Social Workerr. Management plans discussed with the patient,  Grand daughter caitlin  and they are in agreement.  CODE STATUS: DNR  TOTAL TIME TAKING CARE OF THIS PATIENT: 35  minutes.   POSSIBLE D/C IN 1-2  DAYS, DEPENDING ON CLINICAL CONDITION.  Note: This dictation was prepared with Dragon dictation along with smaller phrase technology. Any transcriptional errors that result from this process are unintentional.   Nicholes Mango M.D on 06/11/2018 at 2:29 PM  Between 7am to 6pm - Pager - [phone] After 6pm go to [email or web address] - password [email or web address] [facility]  Office  [phone]  CC: Primary care physician; Venia Carbon, MD

## 2018-06-11 NOTE — Evaluation (Signed)
Occupational Therapy Evaluation Patient Details Name: Sabrina Hall MRN: [id number] DOB: 1923-04-09 Today's Date: 06/11/2018    History of Present Illness Pt is a 84 y.o. female presenting to hospital 06/08/18 s/p fall in shower with resultant occipital head injury and L shoulder pain.  Imaging showing displaced fx surgical neck L humerus and superior endplate compression fx T10 vertebral body with 50% anterior height loss.  Ortho recommending conservative treatment with sling for L UE; neurosurgery (regarding T10 compression fx) recommending no brace unless pt c/o pain with ambulating or bearing weight.  PMH includes Crohn's, htn, neck surgery.   Clinical Impression   Pt seen for OT evaluation this date. Prior to hospital admission, pt was living at an ALF, modified indep with [phone] for short distances and generally able to perform ADL. Currently pt demonstrates impairments in activity tolerance UE functional use, pain, LUE ROM/strength deficits, and impaired balance requiring mod assist for UB dressing and bathing and max assist for LB dressing and bathing. CGA to min assist for limited functional transfers and mobility during toileting task.  Pt would benefit from skilled OT to address noted impairments and functional limitations (see below for any additional details) in order to maximize safety and independence while minimizing falls risk and caregiver burden.  Upon hospital discharge, recommend pt discharge to [facility].    Follow Up Recommendations  SNF    Equipment Recommendations  Other (comment)(TBD)    Recommendations for Other Services       Precautions / Restrictions Precautions Precautions: Fall;Back Precaution Comments: Neurosurgery (in regards to T10 compression fx) recommending no brace unless pt c/o pain when ambulating or bearing weight; ortho recommending sling L UE. Required Braces or Orthoses: Sling Restrictions Weight Bearing Restrictions: Yes LUE Weight Bearing: Non  weight bearing Other Position/Activity Restrictions: L elbow, wrist, and hand ROM per pt tolerance (per discussion with MD Harlow Mares 06/10/18)      Mobility Bed Mobility Overal bed mobility: Needs Assistance Bed Mobility: Supine to Sit;Sit to Supine     Supine to sit: Min assist;HOB elevated Sit to supine: Mod assist   General bed mobility comments: min assist and use of bed rail for sup>sit, mod assist for BLE and trunk support for sit>sup  Transfers Overall transfer level: Needs assistance Equipment used: 1 person hand held assist Transfers: Sit to/from Stand;Stand Pivot Transfers Sit to Stand: Min guard;From elevated surface Stand pivot transfers: Min assist       General transfer comment: CGA for safety; mild increased effort to stand    Balance Overall balance assessment: Needs assistance Sitting-balance support: No upper extremity supported;Feet supported Sitting balance-Leahy Scale: Fair Sitting balance - Comments: steady sitting reaching within BOS with R UE (L UE in sling)   Standing balance support: No upper extremity supported Standing balance-Leahy Scale: Fair Standing balance comment: steady static standing reaching within BOS with R UE (L UE in sling)                           ADL either performed or assessed with clinical judgement   ADL Overall ADL's : Needs assistance/impaired Eating/Feeding: Sitting;Set up   Grooming: Sitting;Minimal assistance   Upper Body Bathing: Sitting;Moderate assistance   Lower Body Bathing: Sit to/from stand;Maximal assistance   Upper Body Dressing : Sitting;Moderate assistance   Lower Body Dressing: Sit to/from stand;Maximal assistance   Toilet Transfer: BSC;Stand-pivot;Cueing for safety;Minimal assistance Toilet Transfer Details (indicate cue type and reason): HHA +1 for RUE Toileting-  Clothing Manipulation and Hygiene: Sit to/from stand;Maximal assistance               Vision Patient Visual Report: No  change from baseline       Perception     Praxis      Pertinent Vitals/Pain Pain Assessment: Faces Faces Pain Scale: Hurts a little bit Pain Location: L proximal UE Pain Descriptors / Indicators: Grimacing Pain Intervention(s): Limited activity within patient's tolerance;Monitored during session;Repositioned     Hand Dominance Right   Extremity/Trunk Assessment Upper Extremity Assessment Upper Extremity Assessment: LUE deficits/detail;Generalized weakness LUE Deficits / Details: LUE in sling, elbow, wrist, hand ROM grossly WFL LUE: Unable to fully assess due to pain;Unable to fully assess due to immobilization   Lower Extremity Assessment Lower Extremity Assessment: Defer to PT evaluation;Generalized weakness   Cervical / Trunk Assessment Cervical / Trunk Assessment: Kyphotic   Communication Communication Communication: No difficulties   Cognition Arousal/Alertness: Awake/alert Behavior During Therapy: WFL for tasks assessed/performed Overall Cognitive Status: Impaired/Different from baseline Area of Impairment: Orientation;Safety/judgement                 Orientation Level: Disoriented to;Situation       Safety/Judgement: Decreased awareness of safety;Decreased awareness of deficits         General Comments  Pt sitting in chair with L UE in sling upon PT arrival.    Exercises     Shoulder Instructions      Home Living Family/patient expects to be discharged to:: Assisted living                             Home Equipment: Walker - 4 wheels   Additional Comments: Pt lives at [facility] ALF      Prior Functioning/Environment Level of Independence: Needs assistance  Gait / Transfers Assistance Needed: Ambulatory with 4ww modified independently ADL's / Homemaking Assistance Needed: indep with basic ADL, ALF manages meds, meals, IADL            OT Problem List: Decreased strength;Decreased knowledge of use of DME or AE;Decreased  range of motion;Decreased activity tolerance;Decreased cognition;Impaired UE functional use;Pain;Impaired balance (sitting and/or standing);Decreased safety awareness      OT Treatment/Interventions: Balance training;Self-care/ADL training;Therapeutic exercise;Therapeutic activities;DME and/or AE instruction;Patient/family education;Manual therapy    OT Goals(Current goals can be found in the care plan section) Acute Rehab OT Goals Patient Stated Goal: to go back to [facility] OT Goal Formulation: With patient Time For Goal Achievement: 06/25/18 Potential to Achieve Goals: Good ADL Goals Pt Will Perform Upper Body Dressing: with min guard assist;sitting(using learned hemi techniques) Pt Will Perform Lower Body Dressing: with adaptive equipment;with min guard assist;sit to/from stand Pt Will Transfer to Toilet: bedside commode;ambulating;with min guard assist Additional ADL Goal #1: Pt will verbalize understanding of LUE sling mgt including proper positioning, donning/doffing, and wear schedule.  OT Frequency: Min 1X/week   Barriers to D/C:            Co-evaluation              AM-PAC PT "6 Clicks" Daily Activity     Outcome Measure Help from another person eating meals?: A Little Help from another person taking care of personal grooming?: A Little Help from another person toileting, which includes using toliet, bedpan, or urinal?: A Little Help from another person bathing (including washing, rinsing, drying)?: A Lot Help from another person to put on and taking off regular  upper body clothing?: A Lot Help from another person to put on and taking off regular lower body clothing?: A Lot 6 Click Score: 15   End of Session Equipment Utilized During Treatment: Gait belt  Activity Tolerance: Patient tolerated treatment well Patient left: in bed;with call bell/phone within reach;with bed alarm set  OT Visit Diagnosis: Other abnormalities of gait and mobility (R26.89);Repeated  falls (R29.6);Muscle weakness (generalized) (M62.81);Pain Pain - Right/Left: Left Pain - part of body: Shoulder;Arm                Time: 7125-2712 OT Time Calculation (min): 15 min Charges:  OT General Charges $OT Visit: 1 Visit OT Evaluation $OT Eval Low Complexity: 1 Low  Jeni Salles, MPH, MS, OTR/L ascom [phone] 06/11/18, 1:10 PM

## 2018-06-11 NOTE — Consult Note (Signed)
Pharmacy consulted to manage electrolytes in  This 84 year old female w/ a  Humerus fracture and K of 2.7 Mg 1.6 on admission. This AM K=3.9. Pharmacy will sign off  Ramond Dial, Pharm.D, BCPS Clinical Pharmacist

## 2018-06-11 NOTE — Evaluation (Signed)
Physical Therapy Evaluation Patient Details Name: Sabrina Hall MRN: [id number] DOB: Jun 29, 1923 Today's Date: 06/11/2018   History of Present Illness  Pt is a 84 y.o. female presenting to hospital 06/08/18 s/p fall in shower with resultant occipital head injury and L shoulder pain.  Imaging showing displaced fx surgical neck L humerus and superior endplate compression fx T10 vertebral body with 50% anterior height loss.  Ortho recommending conservative treatment with sling for L UE; neurosurgery (regarding T10 compression fx) recommending no brace unless pt c/o pain with ambulating or bearing weight.  PMH includes Crohn's, htn, neck surgery.  Clinical Impression  Prior to hospital admission, pt was modified independent ambulating with 4ww.  Pt lives at [facility].  Currently pt is CGA with transfers and min to mod assist with ambulation (with R hand hold assist; L UE in sling).  Pt unable to use 4ww anymore d/t L UE fx.   Pt demonstrating generalized weakness and balance impairments during session as well as confusion intermittently.  No c/o back pain during functional mobility.  Pt would benefit from skilled PT to address noted impairments and functional limitations (see below for any additional details).  Upon hospital discharge, recommend pt discharge to [facility].    Follow Up Recommendations SNF    Equipment Recommendations  Cane    Recommendations for Other Services OT consult     Precautions / Restrictions Precautions Precautions: Fall;Back Precaution Comments: Neurosurgery (in regards to T10 compression fx) recommending no brace unless pt c/o pain when ambulating or bearing weight; ortho recommending sling L UE. Required Braces or Orthoses: Sling Restrictions Weight Bearing Restrictions: Yes LUE Weight Bearing: Non weight bearing Other Position/Activity Restrictions: L elbow, wrist, and hand ROM per pt tolerance (per discussion with MD Harlow Mares 06/10/18)      Mobility  Bed  Mobility               General bed mobility comments: Deferred (pt up in chair beginning/end of session)  Transfers Overall transfer level: Needs assistance Equipment used: None Transfers: Sit to/from Stand Sit to Stand: Min guard         General transfer comment: CGA for safety; mild increased effort to stand  Ambulation/Gait Ambulation/Gait assistance: Min assist;Mod assist Gait Distance (Feet): 60 Feet Assistive device: 1 person hand held assist   Gait velocity: decreased   General Gait Details: narrow BOS; decreased B step length and foot clearance; unsteady even with hand hold assist requiring intermittent min to mod assist to steady ambulating; increased lateral sway noted  Stairs            Wheelchair Mobility    Modified Rankin (Stroke Patients Only)       Balance Overall balance assessment: Needs assistance Sitting-balance support: No upper extremity supported;Feet supported Sitting balance-Leahy Scale: Good Sitting balance - Comments: steady sitting reaching within BOS with R UE (L UE in sling)   Standing balance support: No upper extremity supported Standing balance-Leahy Scale: Good Standing balance comment: steady static standing reaching within BOS with R UE (L UE in sling)                             Pertinent Vitals/Pain Pain Assessment: Faces Faces Pain Scale: No hurt(no pain at rest; occasional grimacing L UE pain when performing functional mobility) Pain Location: L proximal UE Pain Descriptors / Indicators: Grimacing Pain Intervention(s): Limited activity within patient's tolerance;Monitored during session;Premedicated before session;Repositioned  Vitals (HR  and O2 on room air) stable and WFL throughout treatment session.    Home Living Family/patient expects to be discharged to:: Assisted living               Home Equipment: Walker - 4 wheels Additional Comments: Pt lives at [facility]    Prior Function  Level of Independence: Needs assistance   Gait / Transfers Assistance Needed: Ambulatory with 4ww modified independently           Hand Dominance        Extremity/Trunk Assessment   Upper Extremity Assessment Upper Extremity Assessment: Defer to OT evaluation(L UE in sling)    Lower Extremity Assessment Lower Extremity Assessment: Generalized weakness    Cervical / Trunk Assessment Cervical / Trunk Assessment: Kyphotic  Communication   Communication: No difficulties  Cognition Arousal/Alertness: Awake/alert Behavior During Therapy: (Appearing confused) Overall Cognitive Status: Impaired/Different from baseline Area of Impairment: Orientation;Safety/judgement(Oriented to self, place, month.  Pt reported year was 2017 and did not know why she was in hospital.)                         Safety/Judgement: Decreased awareness of safety;Decreased awareness of deficits            General Comments General comments (skin integrity, edema, etc.): Pt sitting in chair with L UE in sling upon PT arrival.  Nursing cleared pt for participation in physical therapy and reports pt with confusion this morning.  Pt and pt's cousin agreeable to PT session.    Exercises  Gait training   Assessment/Plan    PT Assessment Patient needs continued PT services  PT Problem List Decreased strength;Decreased balance;Decreased mobility;Decreased cognition;Decreased knowledge of use of DME;Decreased safety awareness;Decreased knowledge of precautions;Pain       PT Treatment Interventions      PT Goals (Current goals can be found in the Care Plan section)  Acute Rehab PT Goals Patient Stated Goal: to go back to [facility] PT Goal Formulation: With patient/family Time For Goal Achievement: 06/25/18 Potential to Achieve Goals: Good    Frequency 7X/week   Barriers to discharge Decreased caregiver support      Co-evaluation               AM-PAC PT "6 Clicks" Daily  Activity  Outcome Measure Difficulty turning over in bed (including adjusting bedclothes, sheets and blankets)?: A Little Difficulty moving from lying on back to sitting on the side of the bed? : A Lot Difficulty sitting down on and standing up from a chair with arms (e.g., wheelchair, bedside commode, etc,.)?: Unable Help needed moving to and from a bed to chair (including a wheelchair)?: A Little Help needed walking in hospital room?: A Lot Help needed climbing 3-5 steps with a railing? : A Lot 6 Click Score: 13    End of Session Equipment Utilized During Treatment: Gait belt Activity Tolerance: Patient tolerated treatment well Patient left: in chair;with call bell/phone within reach;with chair alarm set;with family/visitor present Nurse Communication: Mobility status;Precautions;Weight bearing status PT Visit Diagnosis: Unsteadiness on feet (R26.81);Other abnormalities of gait and mobility (R26.89);Muscle weakness (generalized) (M62.81);History of falling (Z91.81);Pain Pain - Right/Left: Left Pain - part of body: Arm    Time: 2353-6144 PT Time Calculation (min) (ACUTE ONLY): 18 min   Charges:   PT Evaluation $PT Eval Low Complexity: 1 Low PT Treatments $Gait Training: 8-22 mins       Leitha Bleak, PT 06/11/18, 9:49 AM  [phone]   

## 2018-06-11 NOTE — Progress Notes (Signed)
Per RN in progression rounds this morning patient will likely D/C tomorrow. Plan is for patient to D/C to [facility] room 227. Per Seth Bake admissions coordinator at [facility] she is working on [facility] SNF authorization and patient can come to [employer]. Patient is aware of above.   [employer], LCSW [phone]

## 2018-06-12 LAB — BASIC METABOLIC PANEL
Anion gap: 9 (ref 5–15)
BUN: 13 mg/dL (ref 8–23)
CHLORIDE: 94 mmol/L — AB (ref 98–111)
CO2: 24 mmol/L (ref 22–32)
Calcium: 8.1 mg/dL — ABNORMAL LOW (ref 8.9–10.3)
Creatinine, Ser: 0.52 mg/dL (ref 0.44–1.00)
GFR calc Af Amer: >60 mL/min (ref >60)
Glucose, Bld: 102 mg/dL — ABNORMAL HIGH (ref 70–99)
Potassium: 3.3 mmol/L — ABNORMAL LOW (ref 3.5–5.1)
SODIUM: 127 mmol/L — AB (ref 135–145)

## 2018-06-12 MED ORDER — POTASSIUM CHLORIDE CRYS ER 20 MEQ PO TBCR
40.0000 meq | EXTENDED_RELEASE_TABLET | Freq: Once | ORAL | Status: AC
  Administered 2018-06-12: 40 meq via ORAL
  Filled 2018-06-12: qty 2

## 2018-06-12 MED ORDER — TRAMADOL HCL 50 MG PO TABS: 50.0000 mg | ORAL_TABLET | Freq: Three times a day (TID) | ORAL | 0 refills | Status: DC | PRN

## 2018-06-12 MED ORDER — LOSARTAN POTASSIUM 100 MG PO TABS: 50.0000 mg | ORAL_TABLET | Freq: Every day | ORAL | 0 refills | Status: DC

## 2018-06-12 NOTE — Discharge Summary (Signed)
[place] [facility] [place] NAME: Sabrina Hall    MR#:  [id number]  DATE OF BIRTH:  1922/10/29  DATE OF ADMISSION:  06/08/2018 ADMITTING PHYSICIAN: Harrie Foreman, MD  DATE OF DISCHARGE: 06/12/2018  PRIMARY CARE PHYSICIAN: Venia Carbon, MD    ADMISSION DIAGNOSIS:  Hypokalemia [E87.6] Closed fracture of proximal end of left humerus, unspecified fracture morphology, initial encounter [S42.202A] Closed wedge compression fracture of tenth thoracic vertebra, initial encounter ([place]) [S22.070A]  DISCHARGE DIAGNOSIS:  Left humerus proximal fracture ---conservative management with sling Chronic hyponatremia SECONDARY DIAGNOSIS:   Past Medical History:  Diagnosis Date  . Arthritis   . Cataract   . Crohn's   . Deviated septum   . Eczema   . H/O seasonal allergies   . History of hemorrhoids   . History of shingles   . Hypertension   . Psoriasis   . Vitamin B12 deficiency     HOSPITAL COURSE:  84 year old female admitted for multiple fractures.  * acute on chrornic Hyponatremia  Baseline sodium  128 to132 clinically improving sodium at 127-126-125--127 curbsided Nephrology --appears this pis pt's new baseline. Mental status stable  *acute closed left Humerus fracture:   Dr. Harlow Mares has recommended conservative management and sling no surgical interventions PT consult appreciated  8T10 compression fracture on x-ray-unknown chronicity Pain management as needed neurosurgery consult-recommend against brace unless the patient complains of pain when ambulating or bearing weight Recommending to follow-up with [facility] in 2 to 4 weeks to take repeat x-rays  Hypokalemia/Hypomagnesemia repleted and recheck magnesium at 1.7 -received K dur today  *Hypertension: Stable for age; continue amlodipine and losartan (decreased dosage) -d/ced hydralazine   *Crohn's disease: Stable; continue balsalazide   *DVT  prophylaxis: lovenox   Overall improving D/c to [facility] today Patient agreeable  CONSULTS OBTAINED:  Treatment Team:  Lovell Sheehan, MD Meade Maw, MD Anthonette Legato, MD  DRUG ALLERGIES:   Allergies  Allergen Reactions  . Lactose Dermatitis  . Maxzide [Hydrochlorothiazide W-Triamterene] Other (See Comments)    causes electrolyte disturbance  . Nsaids Other (See Comments)    Reaction: unknown  . Sulfonamide Derivatives Other (See Comments)    Reaction: unknown  . Penicillins Rash and Other (See Comments)    Reaction pulled from Care Everywhere Has patient had a PCN reaction causing immediate rash, facial/tongue/throat swelling, SOB or lightheadedness with hypotension: Unknown Has patient had a PCN reaction causing severe rash involving mucus membranes or skin necrosis: Unknown Has patient had a PCN reaction that required hospitalization: Unknown Has patient had a PCN reaction occurring within the last 10 years: Unknown If all of the above answers are "NO", then may proceed with Cephalosporin use.     DISCHARGE MEDICATIONS:   Allergies as of 06/12/2018      Reactions   Lactose Dermatitis   Maxzide [hydrochlorothiazide W-triamterene] Other (See Comments)   causes electrolyte disturbance   Nsaids Other (See Comments)   Reaction: unknown   Sulfonamide Derivatives Other (See Comments)   Reaction: unknown   Penicillins Rash, Other (See Comments)   Reaction pulled from Care Everywhere Has patient had a PCN reaction causing immediate rash, facial/tongue/throat swelling, SOB or lightheadedness with hypotension: Unknown Has patient had a PCN reaction causing severe rash involving mucus membranes or skin necrosis: Unknown Has patient had a PCN reaction that required hospitalization: Unknown Has patient had a PCN reaction occurring within the last 10 years: Unknown If all of the above answers are "  NO", then may proceed with Cephalosporin use.      Medication List     STOP taking these medications   diphenhydrAMINE 25 mg capsule Commonly known as:  BENADRYL   hydrALAZINE 25 MG tablet Commonly known as:  APRESOLINE     TAKE these medications   acetaminophen 500 MG tablet Commonly known as:  TYLENOL Take 500 mg by mouth 2 (two) times daily.   acetaminophen 325 MG tablet Commonly known as:  TYLENOL Take 325-650 mg by mouth every 4 (four) hours as needed for fever (general discomfort).   alum & mag hydroxide-simeth 200-200-20 MG/5ML suspension Commonly known as:  MAALOX/MYLANTA Take 30 mLs by mouth every 4 (four) hours as needed for indigestion or flatulence (upset stomach).   amLODipine 5 MG tablet Commonly known as:  NORVASC TAKE ONE TABLET BY MOUTH TWICE DAILY   aspirin 81 MG tablet Take 81 mg by mouth daily.   balsalazide 750 MG capsule Commonly known as:  COLAZAL Take 1,500 mg by mouth 2 (two) times daily.   bismuth subsalicylate 570 VX/79TJ suspension Commonly known as:  PEPTO BISMOL Take 10 mLs by mouth 3 (three) times daily as needed for diarrhea or loose stools.   carbamide peroxide 6.5 % OTIC solution Commonly known as:  DEBROX Place 5 drops into both ears daily as needed (cerumen impaction). Instill x5 days   cycloSPORINE 0.05 % ophthalmic emulsion Commonly known as:  RESTASIS Place 1 drop into both eyes 2 (two) times daily.   cycloSPORINE 0.05 % ophthalmic emulsion Commonly known as:  RESTASIS Place 1 drop into both eyes 2 (two) times daily as needed (dry eyes).   dextromethorphan-guaiFENesin 10-100 MG/5ML liquid Commonly known as:  ROBITUSSIN-DM Take 10 mLs by mouth every 4 (four) hours as needed for cough.   diazepam 5 MG tablet Commonly known as:  VALIUM Take 2.5 mg by mouth every 12 (twelve) hours as needed for anxiety.   dicyclomine 10 MG capsule Commonly known as:  BENTYL Take 10 mg by mouth 4 (four) times daily as needed (diarrhea/ abdominal pain).   fish oil-omega-3 fatty acids 1000 MG capsule Take  1 g by mouth 2 (two) times daily.   lactase 3000 units tablet Commonly known as:  LACTAID Take 6,000 Units by mouth 3 (three) times daily with meals.   latanoprost 0.005 % ophthalmic solution Commonly known as:  XALATAN Place 1 drop into both eyes at bedtime.   loperamide 2 MG capsule Commonly known as:  IMODIUM Take 4 mg by mouth 2 (two) times daily as needed for diarrhea or loose stools.   losartan 100 MG tablet Commonly known as:  COZAAR Take 0.5 tablets (50 mg total) by mouth daily. What changed:  how much to take   magnesium hydroxide 400 MG/5ML suspension Commonly known as:  MILK OF MAGNESIA Take 30 mLs by mouth daily as needed for mild constipation.   magnesium oxide 400 MG tablet Commonly known as:  MAG-OX Take 200 mg by mouth daily.   Melatonin 3 MG Tabs Take 1 tablet by mouth at bedtime.   multivitamin tablet Take 1 tablet by mouth daily.   nystatin powder Generic drug:  nystatin Apply 1 g topically 2 (two) times daily as needed (rash).   ondansetron 4 MG tablet Commonly known as:  ZOFRAN Take 4 mg by mouth every 8 (eight) hours as needed for nausea or vomiting.   sertraline 25 MG tablet Commonly known as:  ZOLOFT Take 1 tablet (25 mg total) by mouth  daily.   traMADol 50 MG tablet Commonly known as:  ULTRAM Take 1 tablet (50 mg total) by mouth every 8 (eight) hours as needed for moderate pain.   Vitamin B-12 500 MCG Subl Place 500 mcg under the tongue daily.       If you experience worsening of your admission symptoms, develop shortness of breath, life threatening emergency, suicidal or homicidal thoughts you must seek medical attention immediately by calling 911 or calling your MD immediately  if symptoms less severe.  You Must read complete instructions/literature along with all the possible adverse reactions/side effects for all the Medicines you take and that have been prescribed to you. Take any new Medicines after you have completely understood  and accept all the possible adverse reactions/side effects.   Please note  You were cared for by a hospitalist during your hospital stay. If you have any questions about your discharge medications or the care you received while you were in the hospital after you are discharged, you can call the unit and asked to speak with the hospitalist on call if the hospitalist that took care of you is not available. Once you are discharged, your primary care physician will handle any further medical issues. Please note that NO REFILLS for any discharge medications will be authorized once you are discharged, as it is imperative that you return to your primary care physician (or establish a relationship with a primary care physician if you do not have one) for your aftercare needs so that they can reassess your need for medications and monitor your lab values. Today   SUBJECTIVE   Now new complaints  VITAL SIGNS:  Blood pressure (!) 130/56, pulse 89, temperature 97.9 F (36.6 C), temperature source Oral, resp. rate 16, height 5' 1"  (1.549 m), weight 53.5 kg, SpO2 97 %.  I/O:    Intake/Output Summary (Last 24 hours) at 06/12/2018 1253 Last data filed at 06/11/2018 2148 Gross per 24 hour  Intake 240 ml  Output -  Net 240 ml    PHYSICAL EXAMINATION:  GENERAL:  84 y.o.-year-old patient lying in the bed with no acute distress.  EYES: Pupils equal, round, reactive to light and accommodation. No scleral icterus. Extraocular muscles intact.  HEENT: Head atraumatic, normocephalic. Oropharynx and nasopharynx clear.  NECK:  Supple, no jugular venous distention. No thyroid enlargement, no tenderness.  LUNGS: Normal breath sounds bilaterally, no wheezing, rales,rhonchi or crepitation. No use of accessory muscles of respiration.  CARDIOVASCULAR: S1, S2 normal. No murmurs, rubs, or gallops.  ABDOMEN: Soft, non-tender, non-distended. Bowel sounds present. No organomegaly or mass.  EXTREMITIES: No pedal edema,  cyanosis, or clubbing. Left UE sling+ NEUROLOGIC: Cranial nerves II through XII are intact. Muscle strength 5/5 in all extremities. Sensation intact. Gait not checked.  PSYCHIATRIC: The patient is alert  SKIN: No obvious rash, lesion, or ulcer.   DATA REVIEW:   CBC  Recent Labs  Lab 06/10/18 0518  WBC 8.9  HGB 10.1*  HCT 29.9*  PLT 196    Chemistries  Recent Labs  Lab 06/09/18 0133  06/10/18 0518  06/12/18 0337  NA 123*   < > 126*   < > 127*  K 2.7*   < > 3.3*   < > 3.3*  CL 87*  --  91*   < > 94*  CO2 26  --  27   < > 24  GLUCOSE 129*  --  115*   < > 102*  BUN 16  --  11   < > 13  CREATININE 0.71  --  0.57   < > 0.52  CALCIUM 8.5*  --  8.1*   < > 8.1*  MG  --    < > 1.7  --   --   AST 35  --   --   --   --   ALT 25  --   --   --   --   ALKPHOS 55  --   --   --   --   BILITOT 0.6  --   --   --   --    < > = values in this interval not displayed.    Microbiology Results   Recent Results (from the past 240 hour(s))  Urine culture     Status: None   Collection Time: 06/06/18 10:34 PM  Result Value Ref Range Status   Specimen Description   Final    URINE, RANDOM Performed at [facility], [address], [place], [place] [address]    Special Requests   Final    Normal Performed at [facility] [address], [place], [place] [address]    Culture   Final    NO GROWTH Performed at [facility], [place] [address], [place], [place] [address]    Report Status 06/08/2018 FINAL  Final  MRSA PCR Screening     Status: None   Collection Time: 06/07/18  1:08 AM  Result Value Ref Range Status   MRSA by PCR NEGATIVE NEGATIVE Final    Comment:        The GeneXpert MRSA Assay (FDA approved for NASAL specimens only), is one component of a comprehensive MRSA colonization surveillance program. It is not intended to diagnose MRSA infection nor to guide or monitor treatment for MRSA infections. Performed at [facility], [address], [place], [place] [address]     RADIOLOGY:  No results found.   Management plans discussed with the patient, family and they are in agreement.  CODE STATUS:     Code Status Orders  (From admission, onward)         Start     Ordered   06/09/18 0412  Do not attempt resuscitation (DNR)  Continuous    Question Answer Comment  In the event of cardiac or respiratory ARREST Do not call a "code blue"   In the event of cardiac or respiratory ARREST Do not perform Intubation, CPR, defibrillation or ACLS   In the event of cardiac or respiratory ARREST Use medication by any route, position, wound care, and other measures to relive pain and suffering. May use oxygen, suction and manual treatment of airway obstruction as needed for comfort.      06/09/18 0411        Code Status History    Date Active Date Inactive Code Status Order ID Comments User Context   06/07/2018 0105 06/07/2018 2054 DNR [id number]  Lance Coon, MD Inpatient   03/25/2017 0305 03/27/2017 1830 DNR [id number]  Saundra Shelling, MD Inpatient   03/25/2017 0205 03/25/2017 0305 Full Code [id number]  Hugelmeyer, Ubaldo Glassing, DO ED    Advance Directive Documentation     Most Recent Value  Type of Advance Directive  Out of facility DNR (pink MOST or yellow form)  Pre-existing out of facility DNR order (yellow form or pink MOST form)  Yellow form placed in chart (order not valid for inpatient use)  "MOST" Form in Place?  -  TOTAL TIME TAKING CARE OF THIS PATIENT: *40* minutes.    Fritzi Mandes M.D on 06/12/2018 at 12:53 PM  Between 7am to 6pm - Pager - [phone] After 6pm go to [email or web address] - password [email or web address] [place] [facility]  Office  [phone]  CC: Primary care physician; Venia Carbon, MD

## 2018-06-12 NOTE — Discharge Instructions (Signed)
Left arm sling for humerus fracutre

## 2018-06-12 NOTE — Progress Notes (Signed)
Physical Therapy Treatment Patient Details Name: Sabrina Hall MRN: [id number] DOB: 05-26-1923 Today's Date: 06/12/2018    History of Present Illness Pt is a 84 y.o. female presenting to hospital 06/08/18 s/p fall in shower with resultant occipital head injury and L shoulder pain.  Imaging showing displaced fx surgical neck L humerus and superior endplate compression fx T10 vertebral body with 50% anterior height loss.  Ortho recommending conservative treatment with sling for L UE; neurosurgery (regarding T10 compression fx) recommending no brace unless pt c/o pain with ambulating or bearing weight.  PMH includes Crohn's, htn, neck surgery.    PT Comments    Pt needed encouragement to participate.  Reported not feeling well but agreed to some activity.  Out of bed with min a x 1.  Ambulated to commode with HHA x 1.  Assistance for care.  She then ambulated 57' with HHA +1 with min assist and support for balance.  Remained in recliner after session.  Unsafe to ambulate or transfer without assist.   Follow Up Recommendations  SNF     Equipment Recommendations  Cane    Recommendations for Other Services       Precautions / Restrictions Precautions Precautions: Fall;Back Precaution Comments: Neurosurgery (in regards to T10 compression fx) recommending no brace unless pt c/o pain when ambulating or bearing weight; ortho recommending sling L UE. Required Braces or Orthoses: Sling Restrictions Weight Bearing Restrictions: Yes LUE Weight Bearing: Non weight bearing Other Position/Activity Restrictions: L elbow, wrist, and hand ROM per pt tolerance (per discussion with MD Harlow Mares 06/10/18)    Mobility  Bed Mobility Overal bed mobility: Needs Assistance Bed Mobility: Supine to Sit     Supine to sit: Min assist;HOB elevated        Transfers Overall transfer level: Needs assistance Equipment used: 1 person hand held assist Transfers: Sit to/from [facility] Sit  to Stand: Min guard;From elevated surface Stand pivot transfers: Min assist       General transfer comment: CGA for safety; mild increased effort to stand  Ambulation/Gait Ambulation/Gait assistance: Min assist Gait Distance (Feet): 60 Feet Assistive device: 1 person hand held assist   Gait velocity: decreased   General Gait Details: narrow BOS; decreased B step length and foot clearance; unsteady even with hand hold assist requiring intermittent min to mod assist to steady ambulating; increased lateral sway noted   Stairs             Wheelchair Mobility    Modified Rankin (Stroke Patients Only)       Balance Overall balance assessment: Needs assistance Sitting-balance support: No upper extremity supported;Feet supported Sitting balance-Leahy Scale: Fair     Standing balance support: No upper extremity supported Standing balance-Leahy Scale: Fair Standing balance comment: close guarding for care after commode.  needs UE support for gait.                            Cognition Arousal/Alertness: Awake/alert Behavior During Therapy: WFL for tasks assessed/performed Overall Cognitive Status: Impaired/Different from baseline Area of Impairment: Orientation;Safety/judgement                 Orientation Level: Disoriented to;Situation       Safety/Judgement: Decreased awareness of safety;Decreased awareness of deficits            Exercises Other Exercises Other Exercises: to commode to void/loose BM    General Comments  Pertinent Vitals/Pain Pain Assessment: Faces Faces Pain Scale: Hurts even more Pain Location: L proximal UE with attempts to reposition sling. Pain Descriptors / Indicators: Sore;Grimacing Pain Intervention(s): Limited activity within patient's tolerance    Home Living                      Prior Function            PT Goals (current goals can now be found in the care plan section) Progress towards  PT goals: Progressing toward goals    Frequency    7X/week      PT Plan Current plan remains appropriate    Co-evaluation              AM-PAC PT "6 Clicks" Daily Activity  Outcome Measure  Difficulty turning over in bed (including adjusting bedclothes, sheets and blankets)?: A Little Difficulty moving from lying on back to sitting on the side of the bed? : A Lot Difficulty sitting down on and standing up from a chair with arms (e.g., wheelchair, bedside commode, etc,.)?: Unable Help needed moving to and from a bed to chair (including a wheelchair)?: A Little Help needed walking in hospital room?: A Lot Help needed climbing 3-5 steps with a railing? : A Lot 6 Click Score: 13    End of Session Equipment Utilized During Treatment: Gait belt Activity Tolerance: Patient tolerated treatment well Patient left: in chair;with chair alarm set;with call bell/phone within reach Nurse Communication: Other (comment) Pain - Right/Left: Left Pain - part of body: Arm     Time: 1102-1117 PT Time Calculation (min) (ACUTE ONLY): 16 min  Charges:  $Gait Training: 8-22 mins                     Chesley Noon, PTA 06/12/18, 11:34 AM

## 2018-06-12 NOTE — Clinical Social Work Note (Signed)
The patient will discharge today to [facility] via non-emergent EMS. The patient's family and the facility are aware and in agreement. The facility has received the [facility] authorization. CSW will send all discharge documentation once it is available, then deliver the discharge packet to the chart. At that time, the CSW will sign off. Please consult should additional needs arise.  Santiago Bumpers, MSW, Latanya Presser [phone]

## 2018-06-12 NOTE — Plan of Care (Signed)
  Problem: Education: Goal: Knowledge of General Education information will improve Description Including pain rating scale, medication(s)/side effects and non-pharmacologic comfort measures Outcome: Progressing   

## 2018-06-12 NOTE — Progress Notes (Signed)
Report called and given to Time at [facility]. EMS called for transport. IV removed. No complaints or questions from pt. Pt dressed awaiting on transport.

## 2018-06-14 ENCOUNTER — Telehealth: Payer: Self-pay | Admitting: *Deleted

## 2018-06-14 NOTE — Telephone Encounter (Signed)
Now in rehab with fractured humerus. Some question about whether she will be able to make it back to her AL apartment Will have to see how she does with rehab I saw her briefly this AM, but will do a more thorough visit tomorrow afternoon.  Daughter will be in town in early November--will likely pay to keep her apartment till at least then in hopes she can make it back She also has long term care insurance--would do that after her Medicare A rehab

## 2018-06-14 NOTE — Telephone Encounter (Signed)
Copied from [place] [id number]. Topic: General - Other >> Jun 14, 2018 11:41 AM Valla Leaver wrote: Reason for CRM: Daughter Despina Hick (on [place]) who lives in [place] would like a callback from Dr. Silvio Pate about a fall she had recently and the treatment.

## 2018-06-15 DIAGNOSIS — W19XXXA Unspecified fall, initial encounter: Secondary | ICD-10-CM

## 2018-06-15 DIAGNOSIS — K5000 Crohn's disease of small intestine without complications: Secondary | ICD-10-CM

## 2018-06-15 DIAGNOSIS — E871 Hypo-osmolality and hyponatremia: Secondary | ICD-10-CM

## 2018-06-15 DIAGNOSIS — I10 Essential (primary) hypertension: Secondary | ICD-10-CM | POA: Diagnosis not present

## 2018-06-15 DIAGNOSIS — S22070D Wedge compression fracture of T9-T10 vertebra, subsequent encounter for fracture with routine healing: Secondary | ICD-10-CM

## 2018-06-15 DIAGNOSIS — F39 Unspecified mood [affective] disorder: Secondary | ICD-10-CM

## 2018-06-19 ENCOUNTER — Inpatient Hospital Stay
Admission: EM | Admit: 2018-06-19 | Discharge: 2018-06-22 | DRG: 683 | Disposition: A | Discharge status: Skilled Nursing Facility | Payer: Medicare Other | Source: Skilled Nursing Facility | Attending: Internal Medicine | Admitting: Internal Medicine

## 2018-06-19 ENCOUNTER — Other Ambulatory Visit: Payer: Self-pay

## 2018-06-19 DIAGNOSIS — B9620 Unspecified Escherichia coli [E. coli] as the cause of diseases classified elsewhere: Secondary | ICD-10-CM | POA: Diagnosis present

## 2018-06-19 DIAGNOSIS — Z9049 Acquired absence of other specified parts of digestive tract: Secondary | ICD-10-CM | POA: Diagnosis not present

## 2018-06-19 DIAGNOSIS — Z8619 Personal history of other infectious and parasitic diseases: Secondary | ICD-10-CM

## 2018-06-19 DIAGNOSIS — I10 Essential (primary) hypertension: Secondary | ICD-10-CM | POA: Diagnosis present

## 2018-06-19 DIAGNOSIS — Z90710 Acquired absence of both cervix and uterus: Secondary | ICD-10-CM

## 2018-06-19 DIAGNOSIS — Z882 Allergy status to sulfonamides status: Secondary | ICD-10-CM | POA: Diagnosis not present

## 2018-06-19 DIAGNOSIS — F39 Unspecified mood [affective] disorder: Secondary | ICD-10-CM | POA: Diagnosis not present

## 2018-06-19 DIAGNOSIS — E860 Dehydration: Secondary | ICD-10-CM

## 2018-06-19 DIAGNOSIS — Z823 Family history of stroke: Secondary | ICD-10-CM | POA: Diagnosis not present

## 2018-06-19 DIAGNOSIS — Z79899 Other long term (current) drug therapy: Secondary | ICD-10-CM

## 2018-06-19 DIAGNOSIS — R531 Weakness: Secondary | ICD-10-CM | POA: Diagnosis present

## 2018-06-19 DIAGNOSIS — J302 Other seasonal allergic rhinitis: Secondary | ICD-10-CM | POA: Diagnosis present

## 2018-06-19 DIAGNOSIS — A419 Sepsis, unspecified organism: Secondary | ICD-10-CM

## 2018-06-19 DIAGNOSIS — Z9841 Cataract extraction status, right eye: Secondary | ICD-10-CM

## 2018-06-19 DIAGNOSIS — Z66 Do not resuscitate: Secondary | ICD-10-CM | POA: Diagnosis present

## 2018-06-19 DIAGNOSIS — Z85828 Personal history of other malignant neoplasm of skin: Secondary | ICD-10-CM

## 2018-06-19 DIAGNOSIS — Z880 Allergy status to penicillin: Secondary | ICD-10-CM | POA: Diagnosis not present

## 2018-06-19 DIAGNOSIS — D509 Iron deficiency anemia, unspecified: Secondary | ICD-10-CM | POA: Diagnosis present

## 2018-06-19 DIAGNOSIS — E876 Hypokalemia: Secondary | ICD-10-CM | POA: Diagnosis present

## 2018-06-19 DIAGNOSIS — N390 Urinary tract infection, site not specified: Secondary | ICD-10-CM | POA: Diagnosis present

## 2018-06-19 DIAGNOSIS — Z87891 Personal history of nicotine dependence: Secondary | ICD-10-CM | POA: Diagnosis not present

## 2018-06-19 DIAGNOSIS — I4510 Unspecified right bundle-branch block: Secondary | ICD-10-CM | POA: Diagnosis present

## 2018-06-19 DIAGNOSIS — E538 Deficiency of other specified B group vitamins: Secondary | ICD-10-CM | POA: Diagnosis present

## 2018-06-19 DIAGNOSIS — Z7982 Long term (current) use of aspirin: Secondary | ICD-10-CM | POA: Diagnosis not present

## 2018-06-19 DIAGNOSIS — K5090 Crohn's disease, unspecified, without complications: Secondary | ICD-10-CM | POA: Diagnosis present

## 2018-06-19 DIAGNOSIS — Z888 Allergy status to other drugs, medicaments and biological substances status: Secondary | ICD-10-CM

## 2018-06-19 DIAGNOSIS — S42302A Unspecified fracture of shaft of humerus, left arm, initial encounter for closed fracture: Secondary | ICD-10-CM | POA: Diagnosis not present

## 2018-06-19 DIAGNOSIS — N179 Acute kidney failure, unspecified: Secondary | ICD-10-CM | POA: Diagnosis present

## 2018-06-19 DIAGNOSIS — Z86010 Personal history of colonic polyps: Secondary | ICD-10-CM

## 2018-06-19 DIAGNOSIS — I959 Hypotension, unspecified: Secondary | ICD-10-CM | POA: Diagnosis not present

## 2018-06-19 DIAGNOSIS — Z91011 Allergy to milk products: Secondary | ICD-10-CM

## 2018-06-19 DIAGNOSIS — Z886 Allergy status to analgesic agent status: Secondary | ICD-10-CM

## 2018-06-19 LAB — CBC
HCT: 32.6 % — ABNORMAL LOW (ref 35.0–47.0)
HEMOGLOBIN: 11.1 g/dL — AB (ref 12.0–16.0)
MCH: 27.2 pg (ref 26.0–34.0)
MCHC: 34 g/dL (ref 32.0–36.0)
MCV: 80.1 fL (ref 80.0–100.0)
Platelets: 284 10*3/uL (ref 150–440)
RBC: 4.06 MIL/uL (ref 3.80–5.20)
RDW: 14.4 % (ref 11.5–14.5)
WBC: 12.1 10*3/uL — AB (ref 3.6–11.0)

## 2018-06-19 LAB — URINALYSIS, COMPLETE (UACMP) WITH MICROSCOPIC
Bilirubin Urine: NEGATIVE
Glucose, UA: NEGATIVE mg/dL
HGB URINE DIPSTICK: NEGATIVE
Ketones, ur: NEGATIVE mg/dL
Nitrite: NEGATIVE
Protein, ur: 30 mg/dL — AB
SPECIFIC GRAVITY, URINE: 1.012 (ref 1.005–1.030)
WBC, UA: >50 WBC/hpf — ABNORMAL HIGH (ref 0–5)
pH: 5 (ref 5.0–8.0)

## 2018-06-19 LAB — COMPREHENSIVE METABOLIC PANEL
ALK PHOS: 70 U/L (ref 38–126)
ALT: 16 U/L (ref 0–44)
AST: 16 U/L (ref 15–41)
Albumin: 3.4 g/dL — ABNORMAL LOW (ref 3.5–5.0)
Anion gap: 9 (ref 5–15)
BUN: 63 mg/dL — ABNORMAL HIGH (ref 8–23)
CO2: 23 mmol/L (ref 22–32)
Calcium: 8.7 mg/dL — ABNORMAL LOW (ref 8.9–10.3)
Chloride: 100 mmol/L (ref 98–111)
Creatinine, Ser: 1.36 mg/dL — ABNORMAL HIGH (ref 0.44–1.00)
GFR, EST AFRICAN AMERICAN: 38 mL/min — AB (ref >60)
GFR, EST NON AFRICAN AMERICAN: 33 mL/min — AB (ref >60)
Glucose, Bld: 125 mg/dL — ABNORMAL HIGH (ref 70–99)
Potassium: 2.5 mmol/L — CL (ref 3.5–5.1)
SODIUM: 132 mmol/L — AB (ref 135–145)
Total Bilirubin: 0.7 mg/dL (ref 0.3–1.2)
Total Protein: 6.7 g/dL (ref 6.5–8.1)

## 2018-06-19 LAB — LIPASE, BLOOD: Lipase: 32 U/L (ref 11–51)

## 2018-06-19 LAB — SAMPLE TO BLOOD BANK

## 2018-06-19 LAB — MAGNESIUM: MAGNESIUM: 2 mg/dL (ref 1.7–2.4)

## 2018-06-19 LAB — MRSA PCR SCREENING: MRSA by PCR: NEGATIVE

## 2018-06-19 MED ORDER — LATANOPROST 0.005 % OP SOLN
1.0000 [drp] | Freq: Every day | OPHTHALMIC | Status: DC
  Administered 2018-06-19 – 2018-06-21 (×3): 1 [drp] via OPHTHALMIC
  Filled 2018-06-19 (×2): qty 2.5

## 2018-06-19 MED ORDER — ACETAMINOPHEN 325 MG PO TABS: 650.0000 mg | ORAL_TABLET | Freq: Four times a day (QID) | ORAL | Status: DC | PRN

## 2018-06-19 MED ORDER — ONDANSETRON HCL 4 MG PO TABS
4.0000 mg | ORAL_TABLET | Freq: Three times a day (TID) | ORAL | Status: DC | PRN
  Filled 2018-06-19: qty 1

## 2018-06-19 MED ORDER — INFLUENZA VAC SPLIT HIGH-DOSE 0.5 ML IM SUSY
0.5000 mL | PREFILLED_SYRINGE | INTRAMUSCULAR | Status: DC
  Filled 2018-06-19: qty 0.5

## 2018-06-19 MED ORDER — LACTASE 3000 UNITS PO TABS
6000.0000 [IU] | ORAL_TABLET | Freq: Three times a day (TID) | ORAL | Status: DC
  Administered 2018-06-19 – 2018-06-22 (×9): 6000 [IU] via ORAL
  Filled 2018-06-19 (×10): qty 2

## 2018-06-19 MED ORDER — MAGNESIUM HYDROXIDE 400 MG/5ML PO SUSP
30.0000 mL | Freq: Every day | ORAL | Status: DC | PRN
  Filled 2018-06-19: qty 30

## 2018-06-19 MED ORDER — HYDRALAZINE HCL 20 MG/ML IJ SOLN: 10.0000 mg | Freq: Four times a day (QID) | INTRAMUSCULAR | Status: DC | PRN

## 2018-06-19 MED ORDER — ACETAMINOPHEN 650 MG RE SUPP: 650.0000 mg | Freq: Four times a day (QID) | RECTAL | Status: DC | PRN

## 2018-06-19 MED ORDER — ASPIRIN EC 81 MG PO TBEC
81.0000 mg | DELAYED_RELEASE_TABLET | Freq: Every day | ORAL | Status: DC
  Administered 2018-06-19 – 2018-06-22 (×4): 81 mg via ORAL
  Filled 2018-06-19 (×4): qty 1

## 2018-06-19 MED ORDER — SERTRALINE HCL 25 MG PO TABS
25.0000 mg | ORAL_TABLET | Freq: Every day | ORAL | Status: DC
  Administered 2018-06-20 – 2018-06-22 (×3): 25 mg via ORAL
  Filled 2018-06-19 (×4): qty 1

## 2018-06-19 MED ORDER — MELATONIN 5 MG PO TABS
1.0000 | ORAL_TABLET | Freq: Every day | ORAL | Status: DC
  Administered 2018-06-19 – 2018-06-21 (×3): 5 mg via ORAL
  Filled 2018-06-19 (×4): qty 1

## 2018-06-19 MED ORDER — POTASSIUM CHLORIDE 10 MEQ/100ML IV SOLN
10.0000 meq | Freq: Once | INTRAVENOUS | Status: AC
  Administered 2018-06-19: 10 meq via INTRAVENOUS
  Filled 2018-06-19: qty 100

## 2018-06-19 MED ORDER — SODIUM CHLORIDE 0.9 % IV SOLN: 1.0000 g | Freq: Once | INTRAVENOUS | Status: DC

## 2018-06-19 MED ORDER — DIAZEPAM 5 MG PO TABS: 2.5000 mg | ORAL_TABLET | Freq: Two times a day (BID) | ORAL | Status: DC | PRN

## 2018-06-19 MED ORDER — TRAMADOL HCL 50 MG PO TABS: 50.0000 mg | ORAL_TABLET | Freq: Three times a day (TID) | ORAL | Status: DC | PRN

## 2018-06-19 MED ORDER — AMLODIPINE BESYLATE 5 MG PO TABS
5.0000 mg | ORAL_TABLET | Freq: Two times a day (BID) | ORAL | Status: DC
  Administered 2018-06-19 – 2018-06-22 (×6): 5 mg via ORAL
  Filled 2018-06-19 (×6): qty 1

## 2018-06-19 MED ORDER — VITAMIN B-12 1000 MCG PO TABS
500.0000 ug | ORAL_TABLET | Freq: Every day | ORAL | Status: DC
  Administered 2018-06-20 – 2018-06-22 (×3): 500 ug via ORAL
  Filled 2018-06-19 (×4): qty 1

## 2018-06-19 MED ORDER — ONDANSETRON HCL 4 MG/2ML IJ SOLN: 4.0000 mg | Freq: Four times a day (QID) | INTRAMUSCULAR | Status: DC | PRN

## 2018-06-19 MED ORDER — ENOXAPARIN SODIUM 40 MG/0.4ML ~~LOC~~ SOLN
40.0000 mg | SUBCUTANEOUS | Status: DC
  Administered 2018-06-19: 40 mg via SUBCUTANEOUS
  Filled 2018-06-19: qty 0.4

## 2018-06-19 MED ORDER — OMEGA-3-ACID ETHYL ESTERS 1 G PO CAPS
1.0000 g | ORAL_CAPSULE | Freq: Two times a day (BID) | ORAL | Status: DC
  Administered 2018-06-19 – 2018-06-22 (×6): 1 g via ORAL
  Filled 2018-06-19 (×6): qty 1

## 2018-06-19 MED ORDER — LOPERAMIDE HCL 2 MG PO CAPS
4.0000 mg | ORAL_CAPSULE | Freq: Four times a day (QID) | ORAL | Status: AC
  Administered 2018-06-19 – 2018-06-20 (×3): 4 mg via ORAL
  Filled 2018-06-19 (×4): qty 2

## 2018-06-19 MED ORDER — GUAIFENESIN-DM 100-10 MG/5ML PO SYRP: 10.0000 mL | ORAL_SOLUTION | ORAL | Status: DC | PRN

## 2018-06-19 MED ORDER — MAGNESIUM OXIDE 400 (241.3 MG) MG PO TABS
200.0000 mg | ORAL_TABLET | Freq: Every day | ORAL | Status: DC
  Administered 2018-06-20 – 2018-06-22 (×3): 200 mg via ORAL
  Filled 2018-06-19 (×3): qty 1

## 2018-06-19 MED ORDER — POTASSIUM CHLORIDE IN NACL 20-0.9 MEQ/L-% IV SOLN
INTRAVENOUS | Status: DC
  Administered 2018-06-19 – 2018-06-21 (×4): via INTRAVENOUS
  Filled 2018-06-19 (×5): qty 1000

## 2018-06-19 MED ORDER — ADULT MULTIVITAMIN W/MINERALS CH
1.0000 | ORAL_TABLET | Freq: Every day | ORAL | Status: DC
  Administered 2018-06-20 – 2018-06-22 (×3): 1 via ORAL
  Filled 2018-06-19 (×3): qty 1

## 2018-06-19 MED ORDER — DICYCLOMINE HCL 10 MG PO CAPS: 10.0000 mg | ORAL_CAPSULE | Freq: Four times a day (QID) | ORAL | Status: DC | PRN

## 2018-06-19 MED ORDER — ONDANSETRON HCL 4 MG PO TABS: 4.0000 mg | ORAL_TABLET | Freq: Four times a day (QID) | ORAL | Status: DC | PRN

## 2018-06-19 MED ORDER — CYCLOSPORINE 0.05 % OP EMUL
1.0000 [drp] | Freq: Two times a day (BID) | OPHTHALMIC | Status: DC
  Administered 2018-06-19 – 2018-06-22 (×5): 1 [drp] via OPHTHALMIC
  Filled 2018-06-19 (×8): qty 1

## 2018-06-19 MED ORDER — BISMUTH SUBSALICYLATE 262 MG/15ML PO SUSP
10.0000 mL | Freq: Three times a day (TID) | ORAL | Status: DC | PRN
  Filled 2018-06-19: qty 118

## 2018-06-19 MED ORDER — SODIUM CHLORIDE 0.9 % IV SOLN
Freq: Once | INTRAVENOUS | Status: AC
  Administered 2018-06-19: 15:00:00 via INTRAVENOUS

## 2018-06-19 MED ORDER — BALSALAZIDE DISODIUM 750 MG PO CAPS
1500.0000 mg | ORAL_CAPSULE | Freq: Two times a day (BID) | ORAL | Status: DC
  Administered 2018-06-19 – 2018-06-22 (×6): 1500 mg via ORAL
  Filled 2018-06-19 (×8): qty 2

## 2018-06-19 NOTE — ED Provider Notes (Signed)
[facility] Emergency Department Provider Note   ____________________________________________   First MD Initiated Contact with Patient 06/19/18 1345     (approximate)  I have reviewed the triage vital signs and the nursing notes.   HISTORY  Chief Complaint Weakness and Diarrhea    HPI Sabrina Hall is a 84 y.o. female presents for evaluation of weakness and not wishing to eat today  Patient reports that for the last day and a half at least she has not been feeling well, feeling poor appetite very thirsty.  She reports just has not felt well.  No vomiting.  No fevers or chills.  No chest pain.  Reports she recently fell and injured her left arm.   Also had a few loose stools over the last couple of days.  EMS reports patient seemed pale.  IV fluids initiated by EMS  Past Medical History:  Diagnosis Date  . Arthritis   . Cataract   . Crohn's   . Deviated septum   . Eczema   . H/O seasonal allergies   . History of hemorrhoids   . History of shingles   . Hypertension   . Psoriasis   . Vitamin B12 deficiency     Patient Active Problem List   Diagnosis Date Noted  . Humerus fracture 06/09/2018  . Anxiety 06/06/2018  . Hyponatremia 06/06/2018  . Acute urinary retention 06/06/2018  . Vitamin B12 deficiency   . Mood disorder ([place]) 01/31/2016  . Sleep disorder 12/07/2015  . POLYP, COLON 11/22/2007  . Essential hypertension 11/22/2007  . Crohn's disease ([place]) 11/22/2007  . DIVERTICULITIS OF COLON 11/22/2007  . Generalized osteoarthritis of multiple sites 11/22/2007  . SKIN CANCER, HX OF 11/22/2007    Past Surgical History:  Procedure Laterality Date  . ABDOMINAL HYSTERECTOMY    . basel cell carcinoma    . CATARACT EXTRACTION     right eye  . CHOLECYSTECTOMY    . HYSTERECTOMY    . ILEOCECOSTOMY    . MOHS SURGERY    . NASAL SEPTUM SURGERY    . NECK SURGERY  2004   mass removed from cervical spine  . NOSE SURGERY     basal cell  carcinoma of nose    Prior to Admission medications   Medication Sig Start Date End Date Taking? Authorizing Provider  acetaminophen (TYLENOL) 325 MG tablet Take 325-650 mg by mouth every 4 (four) hours as needed for fever (general discomfort).    [provider]  acetaminophen (TYLENOL) 500 MG tablet Take 500 mg by mouth 2 (two) times daily.    [provider]  alum & mag hydroxide-simeth (MAALOX/MYLANTA) 200-200-20 MG/5ML suspension Take 30 mLs by mouth every 4 (four) hours as needed for indigestion or flatulence (upset stomach).    [provider]  amLODipine (NORVASC) 5 MG tablet TAKE ONE TABLET BY MOUTH TWICE DAILY 08/27/15   Minna Merritts, MD  aspirin 81 MG tablet Take 81 mg by mouth daily.    [provider]  balsalazide (COLAZAL) 750 MG capsule Take 1,500 mg by mouth 2 (two) times daily.    [provider]  bismuth subsalicylate (PEPTO BISMOL) 262 MG/15ML suspension Take 10 mLs by mouth 3 (three) times daily as needed for diarrhea or loose stools.    [provider]  carbamide peroxide (DEBROX) 6.5 % OTIC solution Place 5 drops into both ears daily as needed (cerumen impaction). Instill x5 days    [provider]  Cyanocobalamin (  VITAMIN B-12) 500 MCG SUBL Place 500 mcg under the tongue daily.    [provider]  cycloSPORINE (RESTASIS) 0.05 % ophthalmic emulsion Place 1 drop into both eyes 2 (two) times daily.    [provider]  cycloSPORINE (RESTASIS) 0.05 % ophthalmic emulsion Place 1 drop into both eyes 2 (two) times daily as needed (dry eyes).    [provider]  dextromethorphan-guaiFENesin (ROBITUSSIN-DM) 10-100 MG/5ML liquid Take 10 mLs by mouth every 4 (four) hours as needed for cough.    [provider]  diazepam (VALIUM) 5 MG tablet Take 2.5 mg by mouth every 12 (twelve) hours as needed for anxiety.     [provider]  dicyclomine (BENTYL) 10 MG capsule Take 10 mg by  mouth 4 (four) times daily as needed (diarrhea/ abdominal pain).    [provider]  fish oil-omega-3 fatty acids 1000 MG capsule Take 1 g by mouth 2 (two) times daily.     [provider]  lactase (LACTAID) 3000 units tablet Take 6,000 Units by mouth 3 (three) times daily with meals.    [provider]  latanoprost (XALATAN) 0.005 % ophthalmic solution Place 1 drop into both eyes at bedtime. 03/23/17   [provider]  loperamide (IMODIUM) 2 MG capsule Take 4 mg by mouth 2 (two) times daily as needed for diarrhea or loose stools.     [provider]  losartan (COZAAR) 100 MG tablet Take 0.5 tablets (50 mg total) by mouth daily. 06/12/18   Fritzi Mandes, MD  magnesium hydroxide (MILK OF MAGNESIA) 400 MG/5ML suspension Take 30 mLs by mouth daily as needed for mild constipation.    [provider]  magnesium oxide (MAG-OX) 400 MG tablet Take 200 mg by mouth daily.     [provider]  Melatonin 3 MG TABS Take 1 tablet by mouth at bedtime.    [provider]  Multiple Vitamin (MULTIVITAMIN) tablet Take 1 tablet by mouth daily.      [provider]  nystatin (NYSTATIN) powder Apply 1 g topically 2 (two) times daily as needed (rash).    [provider]  ondansetron (ZOFRAN) 4 MG tablet Take 4 mg by mouth every 8 (eight) hours as needed for nausea or vomiting.    [provider]  sertraline (ZOLOFT) 25 MG tablet Take 1 tablet (25 mg total) by mouth daily. 05/27/18   Venia Carbon, MD  traMADol (ULTRAM) 50 MG tablet Take 1 tablet (50 mg total) by mouth every 8 (eight) hours as needed for moderate pain. 06/12/18   Fritzi Mandes, MD    Allergies Lactose; Maxzide [hydrochlorothiazide w-triamterene]; Nsaids; Sulfonamide derivatives; and Penicillins  Family History  Problem Relation Age of Onset  . Stroke Father   . Cancer Neg Hx   . Diabetes Neg Hx     Social History Social History   Tobacco Use  .  Smoking status: Former Smoker    Packs/day: 0.50    Types: Cigarettes    Last attempt to quit: 09/09/1981    Years since quitting: 36.8  . Smokeless tobacco: Never Used  Substance Use Topics  . Alcohol use: Yes    Comment: occassionally  . Drug use: No    Review of Systems Constitutional: No fever/chills but feels fatigued Eyes: No visual changes. ENT: No sore throat.  Very dry throat Cardiovascular: Denies chest pain. Respiratory: Denies shortness of breath. Gastrointestinal: No abdominal pain.  No nausea, no vomiting.  No diarrhea.  No  constipation. Genitourinary: Negative for dysuria. Musculoskeletal: Negative for back pain.  Some pain in her left upper arm which she broke recently, reports improving slowly.  No falls or hip pain. Skin: Negative for rash. Neurological: Negative for headaches, focal weakness or numbness.    ____________________________________________   PHYSICAL EXAM:  VITAL SIGNS: ED Triage Vitals  Enc Vitals Group     BP 06/19/18 1325 100/75     Pulse Rate 06/19/18 1325 66     Resp 06/19/18 1325 16     Temp 06/19/18 1325 97.8 F (36.6 C)     Temp Source 06/19/18 1325 Oral     SpO2 06/19/18 1325 93 %     Weight 06/19/18 1327 114 lb 9.6 oz (52 kg)     Height 06/19/18 1327 5' 1"  (1.549 m)     Head Circumference --      Peak Flow --      Pain Score 06/19/18 1327 5     Pain Loc --      Pain Edu? --      Excl. in [place]? --     Constitutional: Alert and oriented. Well appearing and in no acute distress.  She does however appear fatigued.  Slightly somnolent. Eyes: Conjunctivae are normal. Head: Atraumatic. Nose: No congestion/rhinnorhea. Mouth/Throat: Mucous membranes are very dry. Neck: No stridor.   Cardiovascular: Normal rate, regular rhythm. Grossly normal heart sounds.  Good peripheral circulation. Respiratory: Slightly elevated respiratory effort.  No retractions. Lungs CTAB. Gastrointestinal: Soft and nontender. No  distention. Musculoskeletal: No lower extremity tenderness nor edema.  Left upper extremity in a sling.  Strong radial pulses and normal capillary refill in the upper extremities bilateral. Neurologic:  Normal speech and language. No gross focal neurologic deficits are appreciated.  Skin:  Skin is warm, dry and intact. No rash noted. Psychiatric: Mood and affect are normal. Speech and behavior are normal though she does seem to slightly somnolent.  ____________________________________________   LABS (all labs ordered are listed, but only abnormal results are displayed)  Labs Reviewed  URINALYSIS, COMPLETE (UACMP) WITH MICROSCOPIC - Abnormal; Notable for the following components:      Result Value   Color, Urine AMBER (*)    APPearance CLOUDY (*)    Protein, ur 30 (*)    Leukocytes, UA LARGE (*)    WBC, UA >50 (*)    Bacteria, UA RARE (*)    All other components within normal limits  CBC - Abnormal; Notable for the following components:   WBC 12.1 (*)    Hemoglobin 11.1 (*)    HCT 32.6 (*)    All other components within normal limits  COMPREHENSIVE METABOLIC PANEL - Abnormal; Notable for the following components:   Sodium 132 (*)    Potassium 2.5 (*)    Glucose, Bld 125 (*)    BUN 63 (*)    Creatinine, Ser 1.36 (*)    Calcium 8.7 (*)    Albumin 3.4 (*)    GFR calc non Af Amer 33 (*)    GFR calc Af Amer 38 (*)    All other components within normal limits  URINE CULTURE  CULTURE, BLOOD (ROUTINE X 2)  CULTURE, BLOOD (ROUTINE X 2)  LIPASE, BLOOD  MAGNESIUM  SAMPLE TO BLOOD BANK   ____________________________________________  EKG  Reviewed and interpreted at 1400 Heart rate 70 QRS 150 QTc 500 Normal sinus rhythm, right bundle branch block.  Slight nonspecific or slurring T wave abnormality in 3, aVF, also slightly  appreciated V5 and V6. ____________________________________________  RADIOLOGY  No results found.  No indication for CT imaging are  noted. ____________________________________________   PROCEDURES  Procedure(s) performed: None  Procedures  Critical Care performed: No  ____________________________________________   INITIAL IMPRESSION / ASSESSMENT AND PLAN / ED COURSE  Pertinent labs & imaging results that were available during my care of the patient were reviewed by me and considered in my medical decision making (see chart for details).  Fatigue.  Appears very dry by clinical exam.  Recent admission and discharge.  Evaluation here and lab work suggestive of urinary tract infection with slight tachypnea and slightly elevated white count with associated white blood cells in clumps and bacteria on the urine sample appears to be consistent with urinary tract infection.  Acute kidney injury, hypokalemia.  Gently replete potassium IV, also gentle hydration, blood cultures, initiate cefepime with pharmacy consult previously documented low risk penicillin allergy.  ----------------------------------------- 3:43 PM on 06/19/2018 -----------------------------------------  Patient understanding and agreeable plan for admission.  Discussed case with Dr. Posey Pronto of hospitalist service.      ____________________________________________   FINAL CLINICAL IMPRESSION(S) / ED DIAGNOSES  Final diagnoses:  Dehydration  Weakness  Lower urinary tract infectious disease  Hypokalemia  AKI (acute kidney injury) ([place])  Sepsis, due to unspecified organism [facility])      NEW MEDICATIONS STARTED DURING THIS VISIT:  New Prescriptions   No medications on file     Note:  This document was prepared using Dragon voice recognition software and may include unintentional dictation errors.     Delman Kitten, MD 06/19/18 1544

## 2018-06-19 NOTE — ED Triage Notes (Signed)
Pt arrives via ems from [facility], pt has been c/o diarrhea for the past few days, pt reports that she had been eating well, but not today, pt c/o feeling weak today and ems reports that she was very pale on the scene, pt is alert and interacting well on arrival

## 2018-06-19 NOTE — Progress Notes (Signed)
Advanced care plan.  Purpose of the Encounter: CODE STATUS  Parties in Attendance: Patient herself  Patient's Decision Capacity: Intact  Subjective/Patient's story: Patient is 84 year old with history of Crohn's disease, chronic diarrhea presenting with generalized weakness   Objective/Medical story I confirmed with the patient regarding her desires for cardiac and pulmonary resuscitation She states that she wants to be a DNR  Goals of care determination: DNR    CODE STATUS: DNR   Time spent discussing advanced care planning: 16 minutes

## 2018-06-19 NOTE — Progress Notes (Signed)
CODE SEPSIS - PHARMACY COMMUNICATION  **Broad Spectrum Antibiotics should be administered within 1 hour of Sepsis diagnosis**  Time Code Sepsis Called/Page Received: 15:38  Antibiotics Ordered: Cefepime  Time of 1st antibiotic administration:   Additional action taken by pharmacy: MD note states to hold off giving antibiotics until Urine culture results are back  If necessary, Name of Provider/Nurse Contacted:     Vira Blanco ,PharmD Clinical Pharmacist  06/19/2018  4:50 PM

## 2018-06-19 NOTE — ED Notes (Signed)
Critical K+ of 2.5 reported to Dr Jacqualine Code - new orders to follow

## 2018-06-19 NOTE — H&P (Signed)
[facility] [place] NAME: Sabrina Hall    MR#:  [id number]  DATE OF BIRTH:  03/09/1923  DATE OF ADMISSION:  06/19/2018  PRIMARY CARE PHYSICIAN: Venia Carbon, MD   REQUESTING/REFERRING PHYSICIAN: Delman Kitten, MD  CHIEF COMPLAINT:   Chief Complaint  Patient presents with  . Weakness  . Diarrhea    HISTORY OF PRESENT ILLNESS: Sabrina Hall  is a 84 y.o. female with a known history of osteoarthritis, Crohn's disease, essential hypertension, psoriasis and history of intermittent chronic diarrhea.  Who is presenting to the hospital with complaint of generalized weakness and lack of appetite.  Patient was just in the hospital on the 14th and discharge back to her assisted living.  Patient states that her diarrhea is much worse over the past few days.  And has gotten very tired and not have appetite.  She does not have any fevers or chills.  Denies any urinary symptoms.  Patient had positive leukocytes in her urine but otherwise was not impressive.      PAST MEDICAL HISTORY:   Past Medical History:  Diagnosis Date  . Arthritis   . Cataract   . Crohn's   . Deviated septum   . Eczema   . H/O seasonal allergies   . History of hemorrhoids   . History of shingles   . Hypertension   . Psoriasis   . Vitamin B12 deficiency     PAST SURGICAL HISTORY:  Past Surgical History:  Procedure Laterality Date  . ABDOMINAL HYSTERECTOMY    . basel cell carcinoma    . CATARACT EXTRACTION     right eye  . CHOLECYSTECTOMY    . HYSTERECTOMY    . ILEOCECOSTOMY    . MOHS SURGERY    . NASAL SEPTUM SURGERY    . NECK SURGERY  2004   mass removed from cervical spine  . NOSE SURGERY     basal cell carcinoma of nose    SOCIAL HISTORY:  Social History   Tobacco Use  . Smoking status: Former Smoker    Packs/day: 0.50    Types: Cigarettes    Last attempt to quit: 09/09/1981    Years since quitting: 36.8  . Smokeless tobacco: Never Used   Substance Use Topics  . Alcohol use: Yes    Comment: occassionally    FAMILY HISTORY:  Family History  Problem Relation Age of Onset  . Stroke Father   . Cancer Neg Hx   . Diabetes Neg Hx     DRUG ALLERGIES:  Allergies  Allergen Reactions  . Lactose Dermatitis  . Maxzide [Hydrochlorothiazide W-Triamterene] Other (See Comments)    causes electrolyte disturbance  . Nsaids Other (See Comments)    Reaction: unknown  . Sulfonamide Derivatives Other (See Comments)    Reaction: unknown  . Penicillins Rash and Other (See Comments)    Reaction pulled from Care Everywhere Has patient had a PCN reaction causing immediate rash, facial/tongue/throat swelling, SOB or lightheadedness with hypotension: Unknown Has patient had a PCN reaction causing severe rash involving mucus membranes or skin necrosis: Unknown Has patient had a PCN reaction that required hospitalization: Unknown Has patient had a PCN reaction occurring within the last 10 years: Unknown If all of the above answers are "NO", then may proceed with Cephalosporin use.     REVIEW OF SYSTEMS:   CONSTITUTIONAL: No fever, fatigue or weakness.  EYES: No blurred or double vision.  EARS, NOSE, AND THROAT: No  tinnitus or ear pain.  RESPIRATORY: No cough, shortness of breath, wheezing or hemoptysis.  CARDIOVASCULAR: No chest pain, orthopnea, edema.  GASTROINTESTINAL: No nausea, vomiting, positive diarrhea or abdominal pain.  GENITOURINARY: No dysuria, hematuria.  ENDOCRINE: No polyuria, nocturia,  HEMATOLOGY: No anemia, easy bruising or bleeding SKIN: No rash or lesion. MUSCULOSKELETAL: No joint pain or arthritis.   NEUROLOGIC: No tingling, numbness, weakness.  PSYCHIATRY: No anxiety or depression.   MEDICATIONS AT HOME:  Prior to Admission medications   Medication Sig Start Date End Date Taking? Authorizing Provider  acetaminophen (TYLENOL) 325 MG tablet Take 325-650 mg by mouth every 4 (four) hours as needed for fever  (general discomfort).    [provider]  acetaminophen (TYLENOL) 500 MG tablet Take 500 mg by mouth 2 (two) times daily.    [provider]  alum & mag hydroxide-simeth (MAALOX/MYLANTA) 200-200-20 MG/5ML suspension Take 30 mLs by mouth every 4 (four) hours as needed for indigestion or flatulence (upset stomach).    [provider]  amLODipine (NORVASC) 5 MG tablet TAKE ONE TABLET BY MOUTH TWICE DAILY 08/27/15   Minna Merritts, MD  aspirin 81 MG tablet Take 81 mg by mouth daily.    [provider]  balsalazide (COLAZAL) 750 MG capsule Take 1,500 mg by mouth 2 (two) times daily.    [provider]  bismuth subsalicylate (PEPTO BISMOL) 262 MG/15ML suspension Take 10 mLs by mouth 3 (three) times daily as needed for diarrhea or loose stools.    [provider]  carbamide peroxide (DEBROX) 6.5 % OTIC solution Place 5 drops into both ears daily as needed (cerumen impaction). Instill x5 days    [provider]  Cyanocobalamin (VITAMIN B-12) 500 MCG SUBL Place 500 mcg under the tongue daily.    [provider]  cycloSPORINE (RESTASIS) 0.05 % ophthalmic emulsion Place 1 drop into both eyes 2 (two) times daily.    [provider]  cycloSPORINE (RESTASIS) 0.05 % ophthalmic emulsion Place 1 drop into both eyes 2 (two) times daily as needed (dry eyes).    [provider]  dextromethorphan-guaiFENesin (ROBITUSSIN-DM) 10-100 MG/5ML liquid Take 10 mLs by mouth every 4 (four) hours as needed for cough.    [provider]  diazepam (VALIUM) 5 MG tablet Take 2.5 mg by mouth every 12 (twelve) hours as needed for anxiety.     [provider]  dicyclomine (BENTYL) 10 MG capsule Take 10 mg by mouth 4 (four) times daily as needed (diarrhea/ abdominal pain).    [provider]  fish oil-omega-3 fatty acids 1000 MG capsule Take 1 g by mouth 2 (two) times daily.     [provider]  lactase (LACTAID)  3000 units tablet Take 6,000 Units by mouth 3 (three) times daily with meals.    [provider]  latanoprost (XALATAN) 0.005 % ophthalmic solution Place 1 drop into both eyes at bedtime. 03/23/17   [provider]  loperamide (IMODIUM) 2 MG capsule Take 4 mg by mouth 2 (two) times daily as needed for diarrhea or loose stools.     [provider]  losartan (COZAAR) 100 MG tablet Take 0.5 tablets (50 mg total) by mouth daily. 06/12/18   Fritzi Mandes, MD  magnesium hydroxide (MILK OF MAGNESIA) 400 MG/5ML suspension Take 30 mLs by mouth daily as needed for mild constipation.    [provider]  magnesium oxide (MAG-OX) 400 MG tablet Take 200 mg by mouth daily.  [provider]  Melatonin 3 MG TABS Take 1 tablet by mouth at bedtime.    [provider]  Multiple Vitamin (MULTIVITAMIN) tablet Take 1 tablet by mouth daily.      [provider]  nystatin (NYSTATIN) powder Apply 1 g topically 2 (two) times daily as needed (rash).    [provider]  ondansetron (ZOFRAN) 4 MG tablet Take 4 mg by mouth every 8 (eight) hours as needed for nausea or vomiting.    [provider]  sertraline (ZOLOFT) 25 MG tablet Take 1 tablet (25 mg total) by mouth daily. 05/27/18   Venia Carbon, MD  traMADol (ULTRAM) 50 MG tablet Take 1 tablet (50 mg total) by mouth every 8 (eight) hours as needed for moderate pain. 06/12/18   Fritzi Mandes, MD      PHYSICAL EXAMINATION:   VITAL SIGNS: Blood pressure (!) 139/121, pulse 76, temperature 97.8 F (36.6 C), temperature source Oral, resp. rate (!) 32, height 5' 1"  (1.549 m), weight 52 kg, SpO2 96 %.  GENERAL:  84 y.o.-year-old patient lying in the bed.  Appears very frail EYES: Pupils equal, round, reactive to light and accommodation. No scleral icterus. Extraocular muscles intact.  HEENT: Head atraumatic, normocephalic. Oropharynx and nasopharynx dry NECK:  Supple, no jugular venous distention. No  thyroid enlargement, no tenderness.  LUNGS: Normal breath sounds bilaterally, no wheezing, rales,rhonchi or crepitation. No use of accessory muscles of respiration.  CARDIOVASCULAR: S1, S2 normal. No murmurs, rubs, or gallops.  ABDOMEN: Soft, nontender, nondistended. Bowel sounds present. No organomegaly or mass.  EXTREMITIES: No pedal edema, cyanosis, or clubbing.  NEUROLOGIC: Cranial nerves II through XII are intact. Muscle strength 5/5 in all extremities. Sensation intact. Gait not checked.  PSYCHIATRIC: The patient is alert and oriented x 3.  SKIN: No obvious rash, lesion, or ulcer.   LABORATORY PANEL:   CBC Recent Labs  Lab 06/19/18 1348  WBC 12.1*  HGB 11.1*  HCT 32.6*  PLT 284  MCV 80.1  MCH 27.2  MCHC 34.0  RDW 14.4   ------------------------------------------------------------------------------------------------------------------  Chemistries  Recent Labs  Lab 06/19/18 1348 06/19/18 1502  NA 132*  --   K 2.5*  --   CL 100  --   CO2 23  --   GLUCOSE 125*  --   BUN 63*  --   CREATININE 1.36*  --   CALCIUM 8.7*  --   MG  --  2.0  AST 16  --   ALT 16  --   ALKPHOS 70  --   BILITOT 0.7  --    ------------------------------------------------------------------------------------------------------------------ estimated creatinine clearance is 19.9 mL/min (A) (by C-G formula based on SCr of 1.36 mg/dL (H)). ------------------------------------------------------------------------------------------------------------------ No results for input(s): TSH, T4TOTAL, T3FREE, THYROIDAB in the last 72 hours.  Invalid input(s): FREET3   Coagulation profile No results for input(s): INR, PROTIME in the last 168 hours. ------------------------------------------------------------------------------------------------------------------- No results for input(s): DDIMER in the last 72  hours. -------------------------------------------------------------------------------------------------------------------  Cardiac Enzymes No results for input(s): CKMB, TROPONINI, MYOGLOBIN in the last 168 hours.  Invalid input(s): CK ------------------------------------------------------------------------------------------------------------------ Invalid input(s): POCBNP  ---------------------------------------------------------------------------------------------------------------  Urinalysis    Component Value Date/Time   COLORURINE AMBER (A) 06/19/2018 1330   APPEARANCEUR CLOUDY (A) 06/19/2018 1330   APPEARANCEUR Hazy 10/07/2014 1323   LABSPEC 1.012 06/19/2018 1330   LABSPEC 1.011 10/07/2014 1323   PHURINE 5.0 06/19/2018 1330   GLUCOSEU NEGATIVE 06/19/2018 1330   GLUCOSEU 150 mg/dL 10/07/2014 1323   HGBUR NEGATIVE 06/19/2018  [place] 06/19/2018 1330   BILIRUBINUR Negative 10/07/2014 1323   KETONESUR NEGATIVE 06/19/2018 1330   PROTEINUR 30 (A) 06/19/2018 1330   NITRITE NEGATIVE 06/19/2018 1330   LEUKOCYTESUR LARGE (A) 06/19/2018 1330   LEUKOCYTESUR 2+ 10/07/2014 1323     RADIOLOGY: No results found.  EKG: Orders placed or performed during the hospital encounter of 06/19/18  . ED EKG  . ED EKG  . EKG 12-Lead  . EKG 12-Lead  . EKG 12-Lead  . EKG 12-Lead    IMPRESSION AND PLAN: Patient is 85 year old with history of Crohn's disease as well as intermittent chronic diarrhea presents with generalized weakness  1.  Acute renal failure due to dehydration we will give IV fluids monitor renal function  2.  Severe hypokalemia due to diarrhea we will replace her potassium  3.  Chronic diarrhea I will continue her regimen however I will change her Imodium to schedule for the next 4 doses  4.  Abnormal UA we will await urine cultures before initiating any antibiotics  5.  Hypertension due to acute renal failure I will hold her Cozaar will place her  on IV hydralazine as needed  6.  History of Crohn's disease continue her home regimen     All the records are reviewed and case discussed with ED provider. Management plans discussed with the patient, family and they are in agreement.  CODE STATUS: Code Status History    Date Active Date Inactive Code Status Order ID Comments User Context   06/09/2018 0411 06/12/2018 1715 DNR [id number]  Harrie Foreman, MD Inpatient   06/07/2018 0105 06/07/2018 2054 DNR [id number]  Lance Coon, MD Inpatient   03/25/2017 0305 03/27/2017 1830 DNR [id number]  Saundra Shelling, MD Inpatient   03/25/2017 0205 03/25/2017 0305 Full Code [id number]  Hugelmeyer, Ubaldo Glassing, DO ED    Questions for Most Recent Historical Code Status (Order [id number])    Question Answer Comment   In the event of cardiac or respiratory ARREST Do not call a "code blue"    In the event of cardiac or respiratory ARREST Do not perform Intubation, CPR, defibrillation or ACLS    In the event of cardiac or respiratory ARREST Use medication by any route, position, wound care, and other measures to relive pain and suffering. May use oxygen, suction and manual treatment of airway obstruction as needed for comfort.         Advance Directive Documentation     Most Recent Value  Type of Advance Directive  Out of facility DNR (pink MOST or yellow form)  Pre-existing out of facility DNR order (yellow form or pink MOST form)  Yellow form placed in chart (order not valid for inpatient use)  "MOST" Form in Place?  -       TOTAL TIME TAKING CARE OF THIS PATIENT: 55 minutes.    Dustin Flock M.D on 06/19/2018 at 3:52 PM  Between 7am to 6pm - Pager - [phone]  After 6pm go to [email or web address] - password [email or web address]  [facility] Office  [phone]  CC: Primary care physician; Venia Carbon, MD

## 2018-06-20 LAB — CBC
HEMATOCRIT: 29.5 % — AB (ref 35.0–47.0)
HEMOGLOBIN: 10 g/dL — AB (ref 12.0–16.0)
MCH: 27.2 pg (ref 26.0–34.0)
MCHC: 34.1 g/dL (ref 32.0–36.0)
MCV: 79.7 fL — ABNORMAL LOW (ref 80.0–100.0)
Platelets: 287 10*3/uL (ref 150–440)
RBC: 3.7 MIL/uL — AB (ref 3.80–5.20)
RDW: 14.5 % (ref 11.5–14.5)
WBC: 11.2 10*3/uL — AB (ref 3.6–11.0)

## 2018-06-20 LAB — BASIC METABOLIC PANEL
ANION GAP: 6 (ref 5–15)
BUN: 49 mg/dL — ABNORMAL HIGH (ref 8–23)
CHLORIDE: 109 mmol/L (ref 98–111)
CO2: 21 mmol/L — AB (ref 22–32)
CREATININE: 0.92 mg/dL (ref 0.44–1.00)
Calcium: 8.2 mg/dL — ABNORMAL LOW (ref 8.9–10.3)
GFR calc non Af Amer: 52 mL/min — ABNORMAL LOW (ref >60)
Glucose, Bld: 127 mg/dL — ABNORMAL HIGH (ref 70–99)
POTASSIUM: 2.3 mmol/L — AB (ref 3.5–5.1)
SODIUM: 136 mmol/L (ref 135–145)

## 2018-06-20 MED ORDER — POTASSIUM CHLORIDE CRYS ER 20 MEQ PO TBCR
40.0000 meq | EXTENDED_RELEASE_TABLET | ORAL | Status: AC
  Administered 2018-06-20 (×2): 40 meq via ORAL
  Filled 2018-06-20 (×2): qty 2

## 2018-06-20 MED ORDER — ENOXAPARIN SODIUM 30 MG/0.3ML ~~LOC~~ SOLN
30.0000 mg | SUBCUTANEOUS | Status: DC
  Administered 2018-06-20: 30 mg via SUBCUTANEOUS
  Filled 2018-06-20: qty 0.3

## 2018-06-20 NOTE — Progress Notes (Signed)
PT Cancellation Note  Patient Details Name: Sabrina Hall MRN: [id number] DOB: 1923/04/01   Cancelled Treatment:    Reason Eval/Treat Not Completed: Medical issues which prohibited therapy, Potassium is 2.3.  Patient will not be seen today and PT evaluation will be attempted tomorrow if appropriate.    Alanson Puls, [place] DPT 06/20/2018, 12:54 PM

## 2018-06-20 NOTE — Progress Notes (Signed)
Anticoagulation monitoring(Lovenox):  84yo  F ordered Lovenox 40 mg Q24h  Filed Weights   06/19/18 1327  Weight: 114 lb 9.6 oz (52 kg)   BMI 21.66   Lab Results  Component Value Date   CREATININE 0.92 06/20/2018   CREATININE 1.36 (H) 06/19/2018   CREATININE 0.52 06/12/2018   Estimated Creatinine Clearance: 29.4 mL/min (by C-G formula based on SCr of 0.92 mg/dL). Hemoglobin & Hematocrit     Component Value Date/Time   HGB 10.0 (L) 06/20/2018 0414   HGB 14.8 10/07/2014 1142   HCT 29.5 (L) 06/20/2018 0414   HCT 45.0 10/07/2014 1142     Per Protocol for Patient with estCrcl < 30 ml/min and BMI < 40, will transition to Lovenox 30 mg Q24h      Chinita Greenland PharmD Clinical Pharmacist 06/20/2018

## 2018-06-20 NOTE — Progress Notes (Signed)
[facility] [place] NAME: Sabrina Hall    MR#:  [id number]  DATE OF BIRTH:  [birth year]  SUBJECTIVE:  CHIEF COMPLAINT:   Chief Complaint  Patient presents with  . Weakness  . Diarrhea   Had recurrent diarrhea, came with weakness.  Noted to have hypokalemia. Feels slightly better today. REVIEW OF SYSTEMS:  CONSTITUTIONAL: No fever, have fatigue or weakness.  EYES: No blurred or double vision.  EARS, NOSE, AND THROAT: No tinnitus or ear pain.  RESPIRATORY: No cough, shortness of breath, wheezing or hemoptysis.  CARDIOVASCULAR: No chest pain, orthopnea, edema.  GASTROINTESTINAL: No nausea, vomiting, have diarrhea or abdominal pain.  GENITOURINARY: No dysuria, hematuria.  ENDOCRINE: No polyuria, nocturia,  HEMATOLOGY: No anemia, easy bruising or bleeding SKIN: No rash or lesion. MUSCULOSKELETAL: No joint pain or arthritis.   NEUROLOGIC: No tingling, numbness, weakness.  PSYCHIATRY: No anxiety or depression.   ROS  DRUG ALLERGIES:   Allergies  Allergen Reactions  . Lactose Dermatitis  . Maxzide [Hydrochlorothiazide W-Triamterene] Other (See Comments)    causes electrolyte disturbance  . Nsaids Other (See Comments)    Reaction: unknown  . Sulfonamide Derivatives Other (See Comments)    Reaction: unknown  . Penicillins Rash and Other (See Comments)    Reaction pulled from Care Everywhere Has patient had a PCN reaction causing immediate rash, facial/tongue/throat swelling, SOB or lightheadedness with hypotension: Unknown Has patient had a PCN reaction causing severe rash involving mucus membranes or skin necrosis: Unknown Has patient had a PCN reaction that required hospitalization: Unknown Has patient had a PCN reaction occurring within the last 10 years: Unknown If all of the above answers are "NO", then may proceed with Cephalosporin use.     VITALS:  Blood pressure (!) 132/48, pulse 71, temperature 98 F (36.7 C),  temperature source Oral, resp. rate 16, height 5' 1"  (1.549 m), weight 52 kg, SpO2 100 %.  PHYSICAL EXAMINATION:  GENERAL:  84 y.o.-year-old thin patient lying in the bed with no acute distress.  EYES: Pupils equal, round, reactive to light and accommodation. No scleral icterus. Extraocular muscles intact.  HEENT: Head atraumatic, normocephalic. Oropharynx and nasopharynx clear.  NECK:  Supple, no jugular venous distention. No thyroid enlargement, no tenderness.  LUNGS: Normal breath sounds bilaterally, no wheezing, rales,rhonchi or crepitation. No use of accessory muscles of respiration.  CARDIOVASCULAR: S1, S2 normal. No murmurs, rubs, or gallops.  ABDOMEN: Soft, nontender, nondistended. Bowel sounds present. No organomegaly or mass.  EXTREMITIES: No pedal edema, cyanosis, or clubbing.  NEUROLOGIC: Cranial nerves II through XII are intact. Muscle strength 4/5 in all extremities. Sensation intact. Gait not checked.  PSYCHIATRIC: The patient is alert and oriented x 2.  SKIN: No obvious rash, lesion, or ulcer.   Physical Exam LABORATORY PANEL:   CBC Recent Labs  Lab 06/20/18 0414  WBC 11.2*  HGB 10.0*  HCT 29.5*  PLT 287   ------------------------------------------------------------------------------------------------------------------  Chemistries  Recent Labs  Lab 06/19/18 1348 06/19/18 1502 06/20/18 0414  NA 132*  --  136  K 2.5*  --  2.3*  CL 100  --  109  CO2 23  --  21*  GLUCOSE 125*  --  127*  BUN 63*  --  49*  CREATININE 1.36*  --  0.92  CALCIUM 8.7*  --  8.2*  MG  --  2.0  --   AST 16  --   --   ALT 16  --   --  ALKPHOS 70  --   --   BILITOT 0.7  --   --    ------------------------------------------------------------------------------------------------------------------  Cardiac Enzymes No results for input(s): TROPONINI in the last 168  hours. ------------------------------------------------------------------------------------------------------------------  RADIOLOGY:  No results found.  ASSESSMENT AND PLAN:   Active Problems:   ARF (acute renal failure) (HCC)   Patient is 84 year old with history of Crohn's disease as well as intermittent chronic diarrhea presents with generalized weakness  1.  Acute renal failure due to dehydration   IV fluids monitor renal function  2.  Severe hypokalemia due to diarrhea we will replace her potassium  Mg normal  3.  Chronic diarrhea I will continue her regimen  change her Imodium to schedule for the next 4 doses  4.  Abnormal UA we will await urine cultures before initiating any antibiotics  5.  Hypertension due to acute renal failure   hold her Cozaar will place her on IV hydralazine as needed  6.  History of Crohn's disease continue her home regimen     All the records are reviewed and case discussed with Care Management/Social Workerr. Management plans discussed with the patient, family and they are in agreement.  CODE STATUS: DNR  TOTAL TIME TAKING CARE OF THIS PATIENT: 35 minutes.    POSSIBLE D/C IN 1-2 DAYS, DEPENDING ON CLINICAL CONDITION.   Vaughan Basta M.D on 06/20/2018   Between 7am to 6pm - Pager - [phone]  After 6pm go to [email or web address] - password [email or web address] [facility]  Office  [phone]  CC: Primary care physician; Venia Carbon, MD  Note: This dictation was prepared with Dragon dictation along with smaller phrase technology. Any transcriptional errors that result from this process are unintentional.

## 2018-06-20 NOTE — Progress Notes (Signed)
Prime doc notified potassium 2.3

## 2018-06-21 LAB — CBC
HCT: 24.6 % — ABNORMAL LOW (ref 35.0–47.0)
Hemoglobin: 8.4 g/dL — ABNORMAL LOW (ref 12.0–16.0)
MCH: 27.7 pg (ref 26.0–34.0)
MCHC: 34.1 g/dL (ref 32.0–36.0)
MCV: 81.2 fL (ref 80.0–100.0)
PLATELETS: 363 10*3/uL (ref 150–440)
RBC: 3.03 MIL/uL — AB (ref 3.80–5.20)
RDW: 14.7 % — ABNORMAL HIGH (ref 11.5–14.5)
WBC: 7.7 10*3/uL (ref 3.6–11.0)

## 2018-06-21 LAB — BASIC METABOLIC PANEL
Anion gap: 2 — ABNORMAL LOW (ref 5–15)
BUN: 30 mg/dL — ABNORMAL HIGH (ref 8–23)
CALCIUM: 8.2 mg/dL — AB (ref 8.9–10.3)
CO2: 22 mmol/L (ref 22–32)
CREATININE: 0.57 mg/dL (ref 0.44–1.00)
Chloride: 118 mmol/L — ABNORMAL HIGH (ref 98–111)
GFR calc non Af Amer: >60 mL/min (ref >60)
Glucose, Bld: 113 mg/dL — ABNORMAL HIGH (ref 70–99)
Potassium: 3.9 mmol/L (ref 3.5–5.1)
SODIUM: 142 mmol/L (ref 135–145)

## 2018-06-21 LAB — URINE CULTURE
Culture: >=100000 — AB
Special Requests: NORMAL

## 2018-06-21 MED ORDER — CEPHALEXIN 500 MG PO CAPS
500.0000 mg | ORAL_CAPSULE | ORAL | Status: AC
  Administered 2018-06-21: 500 mg via ORAL
  Filled 2018-06-21: qty 1

## 2018-06-21 MED ORDER — ENOXAPARIN SODIUM 40 MG/0.4ML ~~LOC~~ SOLN
40.0000 mg | SUBCUTANEOUS | Status: DC
  Administered 2018-06-21: 40 mg via SUBCUTANEOUS
  Filled 2018-06-21: qty 0.4

## 2018-06-21 MED ORDER — CEPHALEXIN 500 MG PO CAPS
500.0000 mg | ORAL_CAPSULE | Freq: Two times a day (BID) | ORAL | Status: DC
  Administered 2018-06-21 – 2018-06-22 (×2): 500 mg via ORAL
  Filled 2018-06-21 (×2): qty 1

## 2018-06-21 NOTE — Progress Notes (Signed)
Anticoagulation monitoring(Lovenox):  84 yo  F ordered Lovenox 30 mg Q24h  Filed Weights   06/19/18 1327  Weight: 114 lb 9.6 oz (52 kg)   BMI 21.66   Lab Results  Component Value Date   CREATININE 0.57 06/21/2018   CREATININE 0.92 06/20/2018   CREATININE 1.36 (H) 06/19/2018   Estimated Creatinine Clearance: 33.9 mL/min (by C-G formula based on SCr of 0.57 mg/dL). Hemoglobin & Hematocrit     Component Value Date/Time   HGB 8.4 (L) 06/21/2018 0414   HGB 14.8 10/07/2014 1142   HCT 24.6 (L) 06/21/2018 0414   HCT 45.0 10/07/2014 1142     Per Protocol for Patient with estCrcl > 30 ml/min and BMI < 40, will transition to Lovenox 40 mg Q24h      Rayna Sexton, PharmD, BCPS Clinical Pharmacist 06/21/2018 1:28 PM

## 2018-06-21 NOTE — Clinical Social Work Note (Signed)
Clinical Social Work Assessment  Patient Details  Name: Sabrina Hall MRN: [id number] Date of Birth: 11-08-1922  Date of referral:  06/21/18               Reason for consult:  Facility Placement                Permission sought to share information with:  Facility Art therapist granted to share information::  Yes, Verbal Permission Granted  Name::        Agency::     Relationship::     Contact Information:     Housing/Transportation Living arrangements for the past 2 months:  [place] of Information:  Patient, Facility Patient Interpreter Needed:  None Criminal Activity/Legal Involvement Pertinent to Current Situation/Hospitalization:  No - Comment as needed Significant Relationships:  Other(Comment) Lives with:  Facility Resident Do you feel safe going back to the place where you live?  Yes Need for family participation in patient care:  Yes (Comment)  Care giving concerns:  Patient typically resides at [facility] [place] but has been at the short term rehab at [facility] recently.    Social Worker assessment / plan:  CSW spoke with Seth Bake at [facility] and they will need to get reauth from [employer]. CSW spoke with patient who is in agreement to return to [facility] to complete her rehab.  Employment status:    Insurance information:    PT Recommendations:    Information / Referral to community resources:     Patient/Family's Response to care:  Patient expressed appreciation for CSW assistance.  Patient/Family's Understanding of and Emotional Response to Diagnosis, Current Treatment, and Prognosis:  Patient was getting ready to be changed and linens changed when CSW was speaking with patient as she was focused on this during our conversation.   Emotional Assessment Appearance:  Appears stated age Attitude/Demeanor/Rapport:  (pleasant and cooperative) Affect (typically observed):  Accepting,  Adaptable, Calm Orientation:  Oriented to Self, Oriented to Place Alcohol / Substance use:  Not Applicable Psych involvement (Current and /or in the community):  No (Comment)  Discharge Needs  Concerns to be addressed:  Care Coordination Readmission within the last 30 days:  Yes Current discharge risk:  none Barriers to Discharge:   insurance re-auth   Shela Leff, LCSW 06/21/2018, 3:37 PM

## 2018-06-21 NOTE — Evaluation (Signed)
Physical Therapy Evaluation Patient Details Name: Sabrina Hall MRN: [id number] DOB: 03/30/1923 Today's Date: 06/21/2018   History of Present Illness  Pt is a 84 y.o female presenting with diarrhea and weakness. Pt recently in the hospital on the 9/14 secondary to a fall and subsequent humeral fracture and discharged back to her ALF. PMH significant for osteoarthritis, Crohn's disease, essential hypertension, psoriasis and history of intermittent chronic diarrhea.  Clinical Impression  Pt recently discharged on 06/12/18 s/p humeral fracture secondary to fall and is now returning for general weakness and diarrhea. Pt lives at [facility] and was previously ambulating with a 4WW until she fractured her humerus and is now using a SPC. Pt reporting she feels her balance and gait are significantly declined recently and she does not feel safe. Pt was ambulating community distances every day as exercise, but today required min assist during 15 feet of ambulation due to unsteadiness. Pt having increased difficulty with getting in and out of bed secondary to weakness requiring significant VC for positioning and min assist for sit to stand. Pt unable to don/doff pull up this date and is unsafe to perform toileting independently. HR remaining in mid 70's to 80's with SpO2 remaining in low to mid 90's t/o session. Pt reporting mild lightheadedness during initial supine to sit, but quickly returning to baseline. Due to concerns for balance and inability to perform ADL's secondary to humeral fracture, pt would most benefit from STR to improve functional independence.     Follow Up Recommendations SNF    Equipment Recommendations  Cane    Recommendations for Other Services       Precautions / Restrictions Precautions Precautions: Fall Restrictions Weight Bearing Restrictions: Yes LUE Weight Bearing: Non weight bearing Other Position/Activity Restrictions: L UE secondary to humeral fracture       Mobility  Bed Mobility Overal bed mobility: Needs Assistance Bed Mobility: Supine to Sit     Supine to sit: HOB elevated;Min guard     General bed mobility comments: 1 UE reliance on bed rail for supine to sit, increased effort and time required, VC for placement of feet and hands   Transfers Overall transfer level: Needs assistance Equipment used: None Transfers: Sit to/from Stand Sit to Stand: Min assist         General transfer comment: min assist for sit to stand from bed, min assist for sit to stand from toilet. Increased time and difficulty noted during sit to stand from bed with heavy R UE reliance, increased hesitation and difficulty with positioning, VC required for hand placement on bed and feet positioning.   Ambulation/Gait Ambulation/Gait assistance: Min guard Gait Distance (Feet): 15 Feet(37fet x 2) Assistive device: (single point cane)       General Gait Details: narrow BOS, decreased stride length, head looking down, unsteady during 15 feet of ambulation with intermittent min assist for support, decreased subjective confidence   Stairs            Wheelchair Mobility    Modified Rankin (Stroke Patients Only)       Balance Overall balance assessment: Needs assistance Sitting-balance support: Feet supported;Single extremity supported Sitting balance-Leahy Scale: Good Sitting balance - Comments: steady sitting reaching within BOS with R UE (L UE in sling)   Standing balance support: No upper extremity supported Standing balance-Leahy Scale: Fair       Tandem Stance - Right Leg: 20(required 1 UE hand hold to get into position)   [place]berg - Eyes Opened:  20(required 1 UE hand hold to get into position) [place]berg - Eyes Closed: 3(required 1 UE hand hold to get into position, was unable to maintain balance)                 Pertinent Vitals/Pain Pain Assessment: No/denies pain    Home Living Family/patient expects to be discharged to::  Assisted living               Home Equipment: Walker - 4 wheels;Cane - single point Additional Comments: [facility]    Prior Function Level of Independence: Needs assistance   Gait / Transfers Assistance Needed: Ambulatory with [phone] prior to fall, now single point cane modified independent with ambulation  ADL's / Homemaking Assistance Needed: indep with basic ADL, ALF manages meds, meals        Hand Dominance        Extremity/Trunk Assessment   Upper Extremity Assessment Upper Extremity Assessment: LUE deficits/detail;Generalized weakness(R UE grossly >3/5 but functional for tasks assessed) LUE Deficits / Details: LUE in sling unable to assess strength at this time LUE: Unable to fully assess due to immobilization    Lower Extremity Assessment Lower Extremity Assessment: Overall WFL for tasks assessed    Cervical / Trunk Assessment Cervical / Trunk Assessment: Kyphotic  Communication   Communication: No difficulties  Cognition Arousal/Alertness: Awake/alert Behavior During Therapy: WFL for tasks assessed/performed Overall Cognitive Status: No family/caregiver present to determine baseline cognitive functioning Area of Impairment: Memory;Problem solving;Safety/judgement(pt appears confused about recent events including her most recent hospital stay and her independence level most recently)                 Orientation Level: Disoriented to;Place   Memory: Decreased short-term memory   Safety/Judgement: Decreased awareness of safety;Decreased awareness of deficits   Problem Solving: Slow processing;Decreased initiation;Requires verbal cues        General Comments      Exercises     Assessment/Plan    PT Assessment Patient needs continued PT services  PT Problem List Decreased strength;Decreased balance;Decreased mobility;Decreased cognition;Decreased knowledge of use of DME;Decreased safety awareness;Decreased knowledge of  precautions;Pain;Decreased activity tolerance       PT Treatment Interventions DME instruction;Balance training;Gait training;Functional mobility training;Patient/family education;Therapeutic activities;Therapeutic exercise    PT Goals (Current goals can be found in the Care Plan section)       Frequency Min 2X/week   Barriers to discharge Decreased caregiver support      Co-evaluation               AM-PAC PT "6 Clicks" Daily Activity  Outcome Measure Difficulty turning over in bed (including adjusting bedclothes, sheets and blankets)?: A Little Difficulty moving from lying on back to sitting on the side of the bed? : A Little Difficulty sitting down on and standing up from a chair with arms (e.g., wheelchair, bedside commode, etc,.)?: Unable Help needed moving to and from a bed to chair (including a wheelchair)?: A Little Help needed walking in hospital room?: A Little Help needed climbing 3-5 steps with a railing? : A Lot 6 Click Score: 15    End of Session Equipment Utilized During Treatment: Gait belt Activity Tolerance: Patient tolerated treatment well Patient left: in chair;with call bell/phone within reach;with chair alarm set Nurse Communication: Mobility status PT Visit Diagnosis: Unsteadiness on feet (R26.81);Other abnormalities of gait and mobility (R26.89);Muscle weakness (generalized) (M62.81);History of falling (Z91.81)    Time: 6314-9702 PT Time Calculation (min) (ACUTE ONLY): 60 min  Charges:              Ernie Avena, SPT 06/21/2018, 4:05 PM

## 2018-06-21 NOTE — NC FL2 (Signed)
[place] LEVEL OF CARE SCREENING TOOL     IDENTIFICATION  Patient Name: Sabrina Hall Birthdate: 1923/09/21 Sex: female Admission Date (Current Location): 06/19/2018  [facility] and [place] Number:  Engineering geologist and Address:  [facility], [address] [place], [place] [address]      Provider Number: [id number]  Attending Physician Name and Address:  Vaughan Basta, *  Relative Name and Phone Number:       Current Level of Care: Hospital Recommended Level of Care: [place] Prior Approval Number:    Date Approved/Denied:   PASRR Number:    Discharge Plan: SNF    Current Diagnoses: Patient Active Problem List   Diagnosis Date Noted  . ARF (acute renal failure) ([place]) 06/19/2018  . Humerus fracture 06/09/2018  . Anxiety 06/06/2018  . Hyponatremia 06/06/2018  . Acute urinary retention 06/06/2018  . Vitamin B12 deficiency   . Mood disorder ([place]) 01/31/2016  . Sleep disorder 12/07/2015  . POLYP, COLON 11/22/2007  . Essential hypertension 11/22/2007  . Crohn's disease ([place]) 11/22/2007  . DIVERTICULITIS OF COLON 11/22/2007  . Generalized osteoarthritis of multiple sites 11/22/2007  . SKIN CANCER, HX OF 11/22/2007    Orientation RESPIRATION BLADDER Height & Weight     Self, Time, Situation, Place  Normal Continent Weight: 114 lb 9.6 oz (52 kg) Height:  5' 1"  (154.9 cm)  BEHAVIORAL SYMPTOMS/MOOD NEUROLOGICAL BOWEL NUTRITION STATUS  (none) (none) Continent Diet(heart healthy)  AMBULATORY STATUS COMMUNICATION OF NEEDS Skin   Extensive Assist   Normal                       Personal Care Assistance Level of Assistance  Bathing, Dressing, Feeding Bathing Assistance: Maximum assistance Feeding assistance: Maximum assistance Dressing Assistance: Maximum assistance     Functional Limitations Info  Hearing   Hearing Info: Impaired      SPECIAL CARE FACTORS FREQUENCY  OT (By licensed  OT)                    Contractures Contractures Info: Not present    Additional Factors Info  Code Status Code Status Info: dnr Allergies Info: lactose;maxide, nsaids, sulfa derivatives, pcn           Current Medications (06/21/2018):  This is the current hospital active medication list Current Facility-Administered Medications  Medication Dose Route Frequency Provider Last Rate Last Dose  . 0.9 % NaCl with KCl 20 mEq/ L  infusion   Intravenous Continuous Dustin Flock, MD 75 mL/hr at 06/21/18 1336    . acetaminophen (TYLENOL) tablet 650 mg  650 mg Oral Q6H PRN Dustin Flock, MD       Or  . acetaminophen (TYLENOL) suppository 650 mg  650 mg Rectal Q6H PRN Dustin Flock, MD      . amLODipine (NORVASC) tablet 5 mg  5 mg Oral BID Dustin Flock, MD   5 mg at 06/21/18 1128  . aspirin EC tablet 81 mg  81 mg Oral Daily Dustin Flock, MD   81 mg at 06/21/18 1129  . balsalazide (COLAZAL) capsule 1,500 mg  1,500 mg Oral BID Dustin Flock, MD   1,500 mg at 06/21/18 1127  . bismuth subsalicylate (PEPTO BISMOL) 262 MG/15ML suspension 10 mL  10 mL Oral TID PRN Dustin Flock, MD      . cycloSPORINE (RESTASIS) 0.05 % ophthalmic emulsion 1 drop  1 drop Both Eyes BID Dustin Flock, MD  1 drop at 06/21/18 1127  . diazepam (VALIUM) tablet 2.5 mg  2.5 mg Oral Q12H PRN Dustin Flock, MD      . dicyclomine (BENTYL) capsule 10 mg  10 mg Oral QID PRN Dustin Flock, MD      . enoxaparin (LOVENOX) injection 40 mg  40 mg Subcutaneous Q24H Rocky Morel, RPH      . guaiFENesin-dextromethorphan (ROBITUSSIN DM) 100-10 MG/5ML syrup 10 mL  10 mL Oral Q4H PRN Dustin Flock, MD      . hydrALAZINE (APRESOLINE) injection 10 mg  10 mg Intravenous Q6H PRN Dustin Flock, MD      . Influenza vac split quadrivalent PF (FLUZONE HIGH-DOSE) injection 0.5 mL  0.5 mL Intramuscular Tomorrow-1000 Dustin Flock, MD      . lactase (LACTAID) tablet 6,000 Units  6,000 Units Oral TID WC Dustin Flock,  MD   6,000 Units at 06/21/18 1127  . latanoprost (XALATAN) 0.005 % ophthalmic solution 1 drop  1 drop Both Eyes QHS Dustin Flock, MD   1 drop at 06/20/18 2313  . magnesium hydroxide (MILK OF MAGNESIA) suspension 30 mL  30 mL Oral Daily PRN Dustin Flock, MD      . magnesium oxide (MAG-OX) tablet 200 mg  200 mg Oral Daily Dustin Flock, MD   200 mg at 06/21/18 1128  . Melatonin TABS 5 mg  1 tablet Oral QHS Dustin Flock, MD   5 mg at 06/20/18 2314  . multivitamin with minerals tablet 1 tablet  1 tablet Oral Daily Dustin Flock, MD   1 tablet at 06/21/18 1128  . omega-3 acid ethyl esters (LOVAZA) capsule 1 g  1 g Oral BID Dustin Flock, MD   1 g at 06/21/18 1128  . ondansetron (ZOFRAN) tablet 4 mg  4 mg Oral Q6H PRN Dustin Flock, MD       Or  . ondansetron (ZOFRAN) injection 4 mg  4 mg Intravenous Q6H PRN Dustin Flock, MD      . ondansetron [facility]) tablet 4 mg  4 mg Oral Q8H PRN Dustin Flock, MD      . sertraline (ZOLOFT) tablet 25 mg  25 mg Oral Daily Dustin Flock, MD   25 mg at 06/21/18 1127  . traMADol (ULTRAM) tablet 50 mg  50 mg Oral Q8H PRN Dustin Flock, MD      . vitamin B-12 (CYANOCOBALAMIN) tablet 500 mcg  500 mcg Oral Daily Dustin Flock, MD   500 mcg at 06/21/18 1128     Discharge Medications: Please see discharge summary for a list of discharge medications.  Relevant Imaging Results:  Relevant Lab Results:   Additional Information    Shela Leff, LCSW

## 2018-06-21 NOTE — Progress Notes (Signed)
[facility] [facility] [place] NAME: Sabrina Hall    MR#:  [id number]  DATE OF BIRTH:  08-13-1923  SUBJECTIVE:  CHIEF COMPLAINT:   Chief Complaint  Patient presents with  . Weakness  . Diarrhea   Had recurrent diarrhea, came with weakness.  Noted to have hypokalemia. Feels slightly better today. Sitting in chair. REVIEW OF SYSTEMS:  CONSTITUTIONAL: No fever, have fatigue or weakness.  EYES: No blurred or double vision.  EARS, NOSE, AND THROAT: No tinnitus or ear pain.  RESPIRATORY: No cough, shortness of breath, wheezing or hemoptysis.  CARDIOVASCULAR: No chest pain, orthopnea, edema.  GASTROINTESTINAL: No nausea, vomiting, have diarrhea or abdominal pain.  GENITOURINARY: No dysuria, hematuria.  ENDOCRINE: No polyuria, nocturia,  HEMATOLOGY: No anemia, easy bruising or bleeding SKIN: No rash or lesion. MUSCULOSKELETAL: No joint pain or arthritis.   NEUROLOGIC: No tingling, numbness, weakness.  PSYCHIATRY: No anxiety or depression.   ROS  DRUG ALLERGIES:   Allergies  Allergen Reactions  . Lactose Dermatitis  . Maxzide [Hydrochlorothiazide W-Triamterene] Other (See Comments)    causes electrolyte disturbance  . Nsaids Other (See Comments)    Reaction: unknown  . Sulfonamide Derivatives Other (See Comments)    Reaction: unknown  . Penicillins Rash and Other (See Comments)    Reaction pulled from Care Everywhere Has patient had a PCN reaction causing immediate rash, facial/tongue/throat swelling, SOB or lightheadedness with hypotension: Unknown Has patient had a PCN reaction causing severe rash involving mucus membranes or skin necrosis: Unknown Has patient had a PCN reaction that required hospitalization: Unknown Has patient had a PCN reaction occurring within the last 10 years: Unknown If all of the above answers are "NO", then may proceed with Cephalosporin use.     VITALS:  Blood pressure (!) 141/58, pulse 71, temperature 97.8  F (36.6 C), temperature source Oral, resp. rate 18, height 5' 1"  (1.549 m), weight 52 kg, SpO2 98 %.  PHYSICAL EXAMINATION:  GENERAL:  84 y.o.-year-old thin patient lying in the bed with no acute distress.  EYES: Pupils equal, round, reactive to light and accommodation. No scleral icterus. Extraocular muscles intact.  HEENT: Head atraumatic, normocephalic. Oropharynx and nasopharynx clear.  NECK:  Supple, no jugular venous distention. No thyroid enlargement, no tenderness.  LUNGS: Normal breath sounds bilaterally, no wheezing, rales,rhonchi or crepitation. No use of accessory muscles of respiration.  CARDIOVASCULAR: S1, S2 normal. No murmurs, rubs, or gallops.  ABDOMEN: Soft, nontender, nondistended. Bowel sounds present. No organomegaly or mass.  EXTREMITIES: No pedal edema, cyanosis, or clubbing. Left UL in sling. NEUROLOGIC: Cranial nerves II through XII are intact. Muscle strength 4/5 in all extremities. Sensation intact. Gait not checked.  PSYCHIATRIC: The patient is alert and oriented x 2.  SKIN: No obvious rash, lesion, or ulcer.   Physical Exam LABORATORY PANEL:   CBC Recent Labs  Lab 06/21/18 0414  WBC 7.7  HGB 8.4*  HCT 24.6*  PLT 363   ------------------------------------------------------------------------------------------------------------------  Chemistries  Recent Labs  Lab 06/19/18 1348 06/19/18 1502  06/21/18 0414  NA 132*  --    < > 142  K 2.5*  --    < > 3.9  CL 100  --    < > 118*  CO2 23  --    < > 22  GLUCOSE 125*  --    < > 113*  BUN 63*  --    < > 30*  CREATININE 1.36*  --    < >  0.57  CALCIUM 8.7*  --    < > 8.2*  MG  --  2.0  --   --   AST 16  --   --   --   ALT 16  --   --   --   ALKPHOS 70  --   --   --   BILITOT 0.7  --   --   --    < > = values in this interval not displayed.   ------------------------------------------------------------------------------------------------------------------  Cardiac Enzymes No results for input(s):  TROPONINI in the last 168 hours. ------------------------------------------------------------------------------------------------------------------  RADIOLOGY:  No results found.  ASSESSMENT AND PLAN:   Active Problems:   ARF (acute renal failure) (HCC)   Patient is 84 year old with history of Crohn's disease as well as intermittent chronic diarrhea presents with generalized weakness  1.  Acute renal failure due to dehydration   IV fluids monitor renal function, improved.  2.  Severe hypokalemia due to diarrhea we will replace her potassium  Mg normal   Normal K now.  3.  Chronic diarrhea I will continue her regimen  change her Imodium to schedule for the next 4 doses  4.  Abnormal UA , have 100 K E coli colonies   Will give oral keflex.  5.  Hypertension due to acute renal failure   hold her Cozaar will place her on IV hydralazine as needed  6.  History of Crohn's disease continue her home regimen  7. Anemia   Seems chronic due to iron deficiency    May need supplement on d/c.     Slight worse now due to IV fluids.  All the records are reviewed and case discussed with Care Management/Social Workerr. Management plans discussed with the patient, family and they are in agreement.  CODE STATUS: DNR  TOTAL TIME TAKING CARE OF THIS PATIENT: 35 minutes.    POSSIBLE D/C IN 1-2 DAYS, DEPENDING ON CLINICAL CONDITION.   Vaughan Basta M.D on 06/21/2018   Between 7am to 6pm - Pager - [phone]  After 6pm go to [email or web address] - password [email or web address] [place] [facility]  Office  [phone]  CC: Primary care physician; Venia Carbon, MD  Note: This dictation was prepared with Dragon dictation along with smaller phrase technology. Any transcriptional errors that result from this process are unintentional.

## 2018-06-22 DIAGNOSIS — N179 Acute kidney failure, unspecified: Secondary | ICD-10-CM

## 2018-06-22 DIAGNOSIS — I959 Hypotension, unspecified: Secondary | ICD-10-CM

## 2018-06-22 DIAGNOSIS — S42302A Unspecified fracture of shaft of humerus, left arm, initial encounter for closed fracture: Secondary | ICD-10-CM

## 2018-06-22 DIAGNOSIS — F39 Unspecified mood [affective] disorder: Secondary | ICD-10-CM | POA: Diagnosis not present

## 2018-06-22 LAB — BASIC METABOLIC PANEL
ANION GAP: 5 (ref 5–15)
BUN: 14 mg/dL (ref 8–23)
CO2: 24 mmol/L (ref 22–32)
Calcium: 8.4 mg/dL — ABNORMAL LOW (ref 8.9–10.3)
Chloride: 109 mmol/L (ref 98–111)
Creatinine, Ser: 0.5 mg/dL (ref 0.44–1.00)
Glucose, Bld: 108 mg/dL — ABNORMAL HIGH (ref 70–99)
POTASSIUM: 3.4 mmol/L — AB (ref 3.5–5.1)
SODIUM: 138 mmol/L (ref 135–145)

## 2018-06-22 LAB — CBC
HEMATOCRIT: 25.3 % — AB (ref 35.0–47.0)
HEMOGLOBIN: 8.7 g/dL — AB (ref 12.0–16.0)
MCH: 27.7 pg (ref 26.0–34.0)
MCHC: 34.4 g/dL (ref 32.0–36.0)
MCV: 80.4 fL (ref 80.0–100.0)
Platelets: UNDETERMINED 10*3/uL (ref 150–440)
RBC: 3.14 MIL/uL — ABNORMAL LOW (ref 3.80–5.20)
RDW: 15 % — ABNORMAL HIGH (ref 11.5–14.5)
WBC: 10.8 10*3/uL (ref 3.6–11.0)

## 2018-06-22 MED ORDER — POTASSIUM CHLORIDE ER 10 MEQ PO TBCR: 10.0000 meq | EXTENDED_RELEASE_TABLET | Freq: Two times a day (BID) | ORAL | 0 refills | Status: DC

## 2018-06-22 MED ORDER — CEPHALEXIN 500 MG PO CAPS: 500.0000 mg | ORAL_CAPSULE | Freq: Two times a day (BID) | ORAL | 0 refills | Status: AC

## 2018-06-22 MED ORDER — FERROUS SULFATE 325 (65 FE) MG PO TBEC: 325.0000 mg | DELAYED_RELEASE_TABLET | Freq: Two times a day (BID) | ORAL | 3 refills | Status: DC

## 2018-06-22 NOTE — Discharge Summary (Signed)
[facility] NAME: Sabrina Hall    MR#:  [id number]  DATE OF BIRTH:  July 02, 1923  DATE OF ADMISSION:  06/19/2018 ADMITTING PHYSICIAN: Dustin Flock, MD  DATE OF DISCHARGE: 06/22/2018   PRIMARY CARE PHYSICIAN: Venia Carbon, MD    ADMISSION DIAGNOSIS:  Dehydration [E86.0] Hypokalemia [E87.6] Lower urinary tract infectious disease [N39.0] Weakness [R53.1] AKI (acute kidney injury) ([place]) [N17.9] Sepsis, due to unspecified organism ([place]) [A41.9]  DISCHARGE DIAGNOSIS:  Active Problems:   ARF (acute renal failure) ([place])   UTI  SECONDARY DIAGNOSIS:   Past Medical History:  Diagnosis Date  . Arthritis   . Cataract   . Crohn's   . Deviated septum   . Eczema   . H/O seasonal allergies   . History of hemorrhoids   . History of shingles   . Hypertension   . Psoriasis   . Vitamin B12 deficiency     HOSPITAL COURSE:   Patient is 84 year old with history of Crohn's disease as well as intermittent chronic diarrhea presents with generalized weakness  1.Acute renal failure due to dehydration   IV fluids monitor renal function, improved.  2.Severe hypokalemia due to diarrhea we will replace her potassium  Mg normal   Normal K now. Will give low dose supplement on d/c;  3.Chronic diarrhea I will continue her regimen  change her Imodium to schedule for the next 4 doses  4.UTI- Abnormal UA , have 100 K E coli colonies   Will give oral keflex.  5.Hypertension due to acute renal failure   hold her Cozaar will place her on IV hydralazine as needed  6.History of Crohn's disease continue her home regimen  7. Anemia   Seems chronic due to iron deficiency    May need supplement on d/c.     Slight worse now due to IV fluids.  DISCHARGE CONDITIONS:   Stable.  CONSULTS OBTAINED:    DRUG ALLERGIES:   Allergies  Allergen Reactions  . Lactose Dermatitis  . Maxzide [Hydrochlorothiazide  W-Triamterene] Other (See Comments)    causes electrolyte disturbance  . Nsaids Other (See Comments)    Reaction: unknown  . Sulfonamide Derivatives Other (See Comments)    Reaction: unknown  . Penicillins Rash and Other (See Comments)    Reaction pulled from Care Everywhere Has patient had a PCN reaction causing immediate rash, facial/tongue/throat swelling, SOB or lightheadedness with hypotension: Unknown Has patient had a PCN reaction causing severe rash involving mucus membranes or skin necrosis: Unknown Has patient had a PCN reaction that required hospitalization: Unknown Has patient had a PCN reaction occurring within the last 10 years: Unknown If all of the above answers are "NO", then may proceed with Cephalosporin use.     DISCHARGE MEDICATIONS:   Allergies as of 06/22/2018      Reactions   Lactose Dermatitis   Maxzide [hydrochlorothiazide W-triamterene] Other (See Comments)   causes electrolyte disturbance   Nsaids Other (See Comments)   Reaction: unknown   Sulfonamide Derivatives Other (See Comments)   Reaction: unknown   Penicillins Rash, Other (See Comments)   Reaction pulled from Care Everywhere Has patient had a PCN reaction causing immediate rash, facial/tongue/throat swelling, SOB or lightheadedness with hypotension: Unknown Has patient had a PCN reaction causing severe rash involving mucus membranes or skin necrosis: Unknown Has patient had a PCN reaction that required hospitalization: Unknown Has patient had a PCN reaction occurring within the last 10 years: Unknown If all  of the above answers are "NO", then may proceed with Cephalosporin use.      Medication List    STOP taking these medications   losartan 100 MG tablet Commonly known as:  COZAAR     TAKE these medications   acetaminophen 500 MG tablet Commonly known as:  TYLENOL Take 500 mg by mouth 2 (two) times daily.   acetaminophen 325 MG tablet Commonly known as:  TYLENOL Take 325-650 mg by  mouth every 4 (four) hours as needed for fever (general discomfort).   alum & mag hydroxide-simeth 200-200-20 MG/5ML suspension Commonly known as:  MAALOX/MYLANTA Take 30 mLs by mouth every 4 (four) hours as needed for indigestion or flatulence (upset stomach).   amLODipine 5 MG tablet Commonly known as:  NORVASC TAKE ONE TABLET BY MOUTH TWICE DAILY   aspirin 81 MG tablet Take 81 mg by mouth daily.   balsalazide 750 MG capsule Commonly known as:  COLAZAL Take 1,500 mg by mouth 2 (two) times daily.   bismuth subsalicylate 814 GY/18HU suspension Commonly known as:  PEPTO BISMOL Take 10 mLs by mouth 3 (three) times daily as needed for diarrhea or loose stools.   carbamide peroxide 6.5 % OTIC solution Commonly known as:  DEBROX Place 5 drops into both ears daily as needed (cerumen impaction). Instill x5 days   cephALEXin 500 MG capsule Commonly known as:  KEFLEX Take 1 capsule (500 mg total) by mouth every 12 (twelve) hours for 4 days.   cycloSPORINE 0.05 % ophthalmic emulsion Commonly known as:  RESTASIS Place 1 drop into both eyes 2 (two) times daily.   cycloSPORINE 0.05 % ophthalmic emulsion Commonly known as:  RESTASIS Place 1 drop into both eyes 2 (two) times daily as needed (dry eyes).   dextromethorphan-guaiFENesin 10-100 MG/5ML liquid Commonly known as:  ROBITUSSIN-DM Take 10 mLs by mouth every 4 (four) hours as needed for cough.   diazepam 5 MG tablet Commonly known as:  VALIUM Take 2.5 mg by mouth every 12 (twelve) hours as needed for anxiety.   dicyclomine 10 MG capsule Commonly known as:  BENTYL Take 10 mg by mouth 4 (four) times daily as needed (diarrhea/ abdominal pain).   ferrous sulfate 325 (65 FE) MG EC tablet Take 1 tablet (325 mg total) by mouth 2 (two) times daily.   fish oil-omega-3 fatty acids 1000 MG capsule Take 1 g by mouth 2 (two) times daily.   lactase 3000 units tablet Commonly known as:  LACTAID Take 6,000 Units by mouth 3 (three)  times daily with meals.   latanoprost 0.005 % ophthalmic solution Commonly known as:  XALATAN Place 1 drop into both eyes at bedtime.   loperamide 2 MG capsule Commonly known as:  IMODIUM Take 4 mg by mouth 2 (two) times daily as needed for diarrhea or loose stools.   magnesium hydroxide 400 MG/5ML suspension Commonly known as:  MILK OF MAGNESIA Take 30 mLs by mouth daily as needed for mild constipation.   magnesium oxide 400 MG tablet Commonly known as:  MAG-OX Take 200 mg by mouth daily.   Melatonin 3 MG Tabs Take 1 tablet by mouth at bedtime.   multivitamin tablet Take 1 tablet by mouth daily.   nystatin powder Generic drug:  nystatin Apply 1 g topically 2 (two) times daily as needed (rash).   ondansetron 4 MG tablet Commonly known as:  ZOFRAN Take 4 mg by mouth every 8 (eight) hours as needed for nausea or vomiting.   potassium chloride  10 MEQ tablet Commonly known as:  K-DUR Take 1 tablet (10 mEq total) by mouth 2 (two) times daily.   sertraline 25 MG tablet Commonly known as:  ZOLOFT Take 1 tablet (25 mg total) by mouth daily.   traMADol 50 MG tablet Commonly known as:  ULTRAM Take 1 tablet (50 mg total) by mouth every 8 (eight) hours as needed for moderate pain.   Vitamin B-12 500 MCG Subl Place 500 mcg under the tongue daily.        DISCHARGE INSTRUCTIONS:    Follow with PMD in 1 week to check on renal func and potassium/  If you experience worsening of your admission symptoms, develop shortness of breath, life threatening emergency, suicidal or homicidal thoughts you must seek medical attention immediately by calling 911 or calling your MD immediately  if symptoms less severe.  You Must read complete instructions/literature along with all the possible adverse reactions/side effects for all the Medicines you take and that have been prescribed to you. Take any new Medicines after you have completely understood and accept all the possible adverse  reactions/side effects.   Please note  You were cared for by a hospitalist during your hospital stay. If you have any questions about your discharge medications or the care you received while you were in the hospital after you are discharged, you can call the unit and asked to speak with the hospitalist on call if the hospitalist that took care of you is not available. Once you are discharged, your primary care physician will handle any further medical issues. Please note that NO REFILLS for any discharge medications will be authorized once you are discharged, as it is imperative that you return to your primary care physician (or establish a relationship with a primary care physician if you do not have one) for your aftercare needs so that they can reassess your need for medications and monitor your lab values.    Today   CHIEF COMPLAINT:   Chief Complaint  Patient presents with  . Weakness  . Diarrhea    HISTORY OF PRESENT ILLNESS:  Mallori Araque  is a 84 y.o. female with a known history of osteoarthritis, Crohn's disease, essential hypertension, psoriasis and history of intermittent chronic diarrhea.  Who is presenting to the hospital with complaint of generalized weakness and lack of appetite.  Patient was just in the hospital on the 14th and discharge back to her assisted living.  Patient states that her diarrhea is much worse over the past few days.  And has gotten very tired and not have appetite.  She does not have any fevers or chills.  Denies any urinary symptoms.  Patient had positive leukocytes in her urine but otherwise was not impressive.   VITAL SIGNS:  Blood pressure (!) 152/63, pulse 79, temperature 98.2 F (36.8 C), temperature source Oral, resp. rate 18, height 5' 1"  (1.549 m), weight 52 kg, SpO2 100 %.  I/O:    Intake/Output Summary (Last 24 hours) at 06/22/2018 1115 Last data filed at 06/21/2018 1900 Gross per 24 hour  Intake 240 ml  Output 150 ml  Net 90 ml     PHYSICAL EXAMINATION:   GENERAL:  84 y.o.-year-old thin patient lying in the bed with no acute distress.  EYES: Pupils equal, round, reactive to light and accommodation. No scleral icterus. Extraocular muscles intact.  HEENT: Head atraumatic, normocephalic. Oropharynx and nasopharynx clear.  NECK:  Supple, no jugular venous distention. No thyroid enlargement, no tenderness.  LUNGS:  Normal breath sounds bilaterally, no wheezing, rales,rhonchi or crepitation. No use of accessory muscles of respiration.  CARDIOVASCULAR: S1, S2 normal. No murmurs, rubs, or gallops.  ABDOMEN: Soft, nontender, nondistended. Bowel sounds present. No organomegaly or mass.  EXTREMITIES: No pedal edema, cyanosis, or clubbing. Left UL in sling. NEUROLOGIC: Cranial nerves II through XII are intact. Muscle strength 4/5 in all extremities. Sensation intact. Gait not checked.  PSYCHIATRIC: The patient is alert and oriented x 2.  SKIN: No obvious rash, lesion, or ulcer.   DATA REVIEW:   CBC Recent Labs  Lab 06/22/18 0258  WBC 10.8  HGB 8.7*  HCT 25.3*  PLT PLATELET CLUMPS NOTED ON SMEAR, UNABLE TO ESTIMATE    Chemistries  Recent Labs  Lab 06/19/18 1348 06/19/18 1502  06/22/18 0258  NA 132*  --    < > 138  K 2.5*  --    < > 3.4*  CL 100  --    < > 109  CO2 23  --    < > 24  GLUCOSE 125*  --    < > 108*  BUN 63*  --    < > 14  CREATININE 1.36*  --    < > 0.50  CALCIUM 8.7*  --    < > 8.4*  MG  --  2.0  --   --   AST 16  --   --   --   ALT 16  --   --   --   ALKPHOS 70  --   --   --   BILITOT 0.7  --   --   --    < > = values in this interval not displayed.    Cardiac Enzymes No results for input(s): TROPONINI in the last 168 hours.  Microbiology Results  Results for orders placed or performed during the hospital encounter of 06/19/18  Urine Culture     Status: Abnormal   Collection Time: 06/19/18  2:55 PM  Result Value Ref Range Status   Specimen Description   Final    URINE,  CATHETERIZED Performed at [facility], [address] [place], [place] [address]    Special Requests   Final    Normal Performed at [facility], [address] [place], [place] [address]    Culture >=100,000 COLONIES/mL ESCHERICHIA COLI (A)  Final   Report Status 06/21/2018 FINAL  Final   Organism ID, Bacteria ESCHERICHIA COLI (A)  Final      Susceptibility   Escherichia coli - MIC*    AMPICILLIN 4 SENSITIVE Sensitive     CEFAZOLIN <=4 SENSITIVE Sensitive     CEFTRIAXONE <=1 SENSITIVE Sensitive     CIPROFLOXACIN <=0.25 SENSITIVE Sensitive     GENTAMICIN <=1 SENSITIVE Sensitive     IMIPENEM <=0.25 SENSITIVE Sensitive     NITROFURANTOIN <=16 SENSITIVE Sensitive     TRIMETH/SULFA <=20 SENSITIVE Sensitive     AMPICILLIN/SULBACTAM <=2 SENSITIVE Sensitive     PIP/TAZO <=4 SENSITIVE Sensitive     Extended ESBL NEGATIVE Sensitive     * >=100,000 COLONIES/mL ESCHERICHIA COLI  Blood Culture (routine x 2)     Status: None (Preliminary result)   Collection Time: 06/19/18  4:09 PM  Result Value Ref Range Status   Specimen Description BLOOD BLOOD RIGHT HAND  Final   Special Requests   Final    BOTTLES DRAWN AEROBIC AND ANAEROBIC Blood Culture adequate volume   Culture   Final  NO GROWTH 3 DAYS Performed at [facility], [address] [place], [place] [address]    Report Status PENDING  Incomplete  Blood Culture (routine x 2)     Status: None (Preliminary result)   Collection Time: 06/19/18  4:09 PM  Result Value Ref Range Status   Specimen Description BLOOD RIGHT ANTECUBITAL  Final   Special Requests   Final    BOTTLES DRAWN AEROBIC AND ANAEROBIC Blood Culture adequate volume   Culture   Final    NO GROWTH 3 DAYS Performed at [facility], [address], [place], [place] [address]    Report Status PENDING  Incomplete  MRSA PCR Screening     Status: None   Collection Time: 06/19/18  6:38 PM  Result Value Ref Range Status   MRSA by PCR  NEGATIVE NEGATIVE Final    Comment:        The GeneXpert MRSA Assay (FDA approved for NASAL specimens only), is one component of a comprehensive MRSA colonization surveillance program. It is not intended to diagnose MRSA infection nor to guide or monitor treatment for MRSA infections. Performed at [facility], [address], [place], [place] [address]     RADIOLOGY:  No results found.  EKG:   Orders placed or performed during the hospital encounter of 06/19/18  . ED EKG  . ED EKG  . EKG 12-Lead  . EKG 12-Lead  . EKG 12-Lead  . EKG 12-Lead  . EKG      Management plans discussed with the patient, family and they are in agreement.  CODE STATUS:     Code Status Orders  (From admission, onward)         Start     Ordered   06/19/18 1711  Do not attempt resuscitation (DNR)  Continuous    Question Answer Comment  In the event of cardiac or respiratory ARREST Do not call a "code blue"   In the event of cardiac or respiratory ARREST Do not perform Intubation, CPR, defibrillation or ACLS   In the event of cardiac or respiratory ARREST Use medication by any route, position, wound care, and other measures to relive pain and suffering. May use oxygen, suction and manual treatment of airway obstruction as needed for comfort.      06/19/18 1710        Code Status History    Date Active Date Inactive Code Status Order ID Comments User Context   06/09/2018 0411 06/12/2018 1715 DNR [id number]  Harrie Foreman, MD Inpatient   06/07/2018 0105 06/07/2018 2054 DNR [id number]  Lance Coon, MD Inpatient   03/25/2017 0305 03/27/2017 1830 DNR [id number]  Saundra Shelling, MD Inpatient   03/25/2017 0205 03/25/2017 0305 Full Code [id number]  Hugelmeyer, Ubaldo Glassing, DO ED    Advance Directive Documentation     Most Recent Value  Type of Advance Directive  Out of facility DNR (pink MOST or yellow form)  Pre-existing out of facility DNR order (yellow form or pink MOST form)  Yellow form  placed in chart (order not valid for inpatient use)  "MOST" Form in Place?  -      TOTAL TIME TAKING CARE OF THIS PATIENT: 35 minutes.    Vaughan Basta M.D on 06/22/2018 at 11:15 AM  Between 7am to 6pm - Pager - [phone]  After 6pm go to [email or web address] - password [email or web address] [facility]  Office  [phone]  CC: Primary care physician; Venia Carbon, MD  Note: This dictation was prepared with Dragon dictation along with smaller phrase technology. Any transcriptional errors that result from this process are unintentional.

## 2018-06-22 NOTE — Clinical Social Work Note (Signed)
CSW aware physician is discharging patient today to return to [facility]. CSW has sent discharge information to Seth Bake at [facility]. CSW notified patient's granddaughter: Berenice Primas: [phone]. She prefers [employer]. CSW notified Seth Bake at [facility] and they will transport. Nurse aware. Shela Leff MsW,LCSW [phone]

## 2018-06-22 NOTE — Care Management Important Message (Signed)
Copy of signed IM left with patient in room.  

## 2018-06-24 LAB — CULTURE, BLOOD (ROUTINE X 2)
Culture: NO GROWTH
Culture: NO GROWTH
SPECIAL REQUESTS: ADEQUATE
Special Requests: ADEQUATE

## 2018-07-03 ENCOUNTER — Emergency Department: Payer: Medicare Other

## 2018-07-03 ENCOUNTER — Other Ambulatory Visit: Payer: Self-pay

## 2018-07-03 ENCOUNTER — Inpatient Hospital Stay
Admission: EM | Admit: 2018-07-03 | Discharge: 2018-07-06 | DRG: 378 | Disposition: A | Discharge status: Hospice | Payer: Medicare Other | Attending: Internal Medicine | Admitting: Internal Medicine

## 2018-07-03 DIAGNOSIS — Y9301 Activity, walking, marching and hiking: Secondary | ICD-10-CM | POA: Diagnosis present

## 2018-07-03 DIAGNOSIS — Z9109 Other allergy status, other than to drugs and biological substances: Secondary | ICD-10-CM

## 2018-07-03 DIAGNOSIS — D62 Acute posthemorrhagic anemia: Secondary | ICD-10-CM | POA: Diagnosis present

## 2018-07-03 DIAGNOSIS — Z886 Allergy status to analgesic agent status: Secondary | ICD-10-CM

## 2018-07-03 DIAGNOSIS — S42209A Unspecified fracture of upper end of unspecified humerus, initial encounter for closed fracture: Secondary | ICD-10-CM

## 2018-07-03 DIAGNOSIS — F0390 Unspecified dementia without behavioral disturbance: Secondary | ICD-10-CM | POA: Diagnosis present

## 2018-07-03 DIAGNOSIS — Z880 Allergy status to penicillin: Secondary | ICD-10-CM | POA: Diagnosis not present

## 2018-07-03 DIAGNOSIS — L309 Dermatitis, unspecified: Secondary | ICD-10-CM | POA: Diagnosis present

## 2018-07-03 DIAGNOSIS — S42302A Unspecified fracture of shaft of humerus, left arm, initial encounter for closed fracture: Secondary | ICD-10-CM | POA: Diagnosis not present

## 2018-07-03 DIAGNOSIS — Z87891 Personal history of nicotine dependence: Secondary | ICD-10-CM

## 2018-07-03 DIAGNOSIS — Z79899 Other long term (current) drug therapy: Secondary | ICD-10-CM | POA: Diagnosis not present

## 2018-07-03 DIAGNOSIS — K5090 Crohn's disease, unspecified, without complications: Secondary | ICD-10-CM | POA: Diagnosis present

## 2018-07-03 DIAGNOSIS — Z85828 Personal history of other malignant neoplasm of skin: Secondary | ICD-10-CM | POA: Diagnosis not present

## 2018-07-03 DIAGNOSIS — M1990 Unspecified osteoarthritis, unspecified site: Secondary | ICD-10-CM | POA: Diagnosis present

## 2018-07-03 DIAGNOSIS — Z8619 Personal history of other infectious and parasitic diseases: Secondary | ICD-10-CM | POA: Diagnosis not present

## 2018-07-03 DIAGNOSIS — H409 Unspecified glaucoma: Secondary | ICD-10-CM | POA: Diagnosis present

## 2018-07-03 DIAGNOSIS — W1830XA Fall on same level, unspecified, initial encounter: Secondary | ICD-10-CM | POA: Diagnosis present

## 2018-07-03 DIAGNOSIS — I10 Essential (primary) hypertension: Secondary | ICD-10-CM | POA: Diagnosis present

## 2018-07-03 DIAGNOSIS — Z7982 Long term (current) use of aspirin: Secondary | ICD-10-CM | POA: Diagnosis not present

## 2018-07-03 DIAGNOSIS — K922 Gastrointestinal hemorrhage, unspecified: Principal | ICD-10-CM | POA: Diagnosis present

## 2018-07-03 DIAGNOSIS — D509 Iron deficiency anemia, unspecified: Secondary | ICD-10-CM | POA: Diagnosis present

## 2018-07-03 DIAGNOSIS — Z882 Allergy status to sulfonamides status: Secondary | ICD-10-CM | POA: Diagnosis not present

## 2018-07-03 DIAGNOSIS — K921 Melena: Secondary | ICD-10-CM | POA: Diagnosis present

## 2018-07-03 DIAGNOSIS — E538 Deficiency of other specified B group vitamins: Secondary | ICD-10-CM | POA: Diagnosis present

## 2018-07-03 DIAGNOSIS — Z888 Allergy status to other drugs, medicaments and biological substances status: Secondary | ICD-10-CM | POA: Diagnosis not present

## 2018-07-03 DIAGNOSIS — Z9049 Acquired absence of other specified parts of digestive tract: Secondary | ICD-10-CM

## 2018-07-03 DIAGNOSIS — W19XXXA Unspecified fall, initial encounter: Secondary | ICD-10-CM

## 2018-07-03 DIAGNOSIS — Z90710 Acquired absence of both cervix and uterus: Secondary | ICD-10-CM

## 2018-07-03 DIAGNOSIS — F39 Unspecified mood [affective] disorder: Secondary | ICD-10-CM | POA: Diagnosis not present

## 2018-07-03 DIAGNOSIS — Z66 Do not resuscitate: Secondary | ICD-10-CM | POA: Diagnosis present

## 2018-07-03 DIAGNOSIS — L409 Psoriasis, unspecified: Secondary | ICD-10-CM | POA: Diagnosis present

## 2018-07-03 LAB — CBC
HCT: 20.9 % — ABNORMAL LOW (ref 35.0–47.0)
Hemoglobin: 6.8 g/dL — ABNORMAL LOW (ref 12.0–16.0)
MCH: 28.5 pg (ref 26.0–34.0)
MCHC: 32.6 g/dL (ref 32.0–36.0)
MCV: 87.3 fL (ref 80.0–100.0)
PLATELETS: 187 10*3/uL (ref 150–440)
RBC: 2.4 MIL/uL — ABNORMAL LOW (ref 3.80–5.20)
RDW: 21 % — AB (ref 11.5–14.5)
WBC: 9.9 10*3/uL (ref 3.6–11.0)

## 2018-07-03 LAB — BASIC METABOLIC PANEL
ANION GAP: 9 (ref 5–15)
BUN: 42 mg/dL — AB (ref 8–23)
CO2: 23 mmol/L (ref 22–32)
Calcium: 8.1 mg/dL — ABNORMAL LOW (ref 8.9–10.3)
Chloride: 106 mmol/L (ref 98–111)
Creatinine, Ser: 0.74 mg/dL (ref 0.44–1.00)
GFR calc Af Amer: >60 mL/min (ref >60)
GLUCOSE: 109 mg/dL — AB (ref 70–99)
Potassium: 4 mmol/L (ref 3.5–5.1)
Sodium: 138 mmol/L (ref 135–145)

## 2018-07-03 LAB — APTT: aPTT: 25 seconds (ref 24–36)

## 2018-07-03 LAB — HEMOGLOBIN
HEMOGLOBIN: 9.5 g/dL — AB (ref 12.0–16.0)
Hemoglobin: 8.9 g/dL — ABNORMAL LOW (ref 12.0–16.0)

## 2018-07-03 LAB — PROTIME-INR
INR: 1.13
Prothrombin Time: 14.4 seconds (ref 11.4–15.2)

## 2018-07-03 LAB — PREPARE RBC (CROSSMATCH)

## 2018-07-03 LAB — LACTIC ACID, PLASMA: LACTIC ACID, VENOUS: 1.3 mmol/L (ref 0.5–1.9)

## 2018-07-03 MED ORDER — MELATONIN 5 MG PO TABS
5.0000 mg | ORAL_TABLET | Freq: Every day | ORAL | Status: DC
  Administered 2018-07-03 – 2018-07-05 (×3): 5 mg via ORAL
  Filled 2018-07-03 (×5): qty 1

## 2018-07-03 MED ORDER — PANTOPRAZOLE SODIUM 40 MG IV SOLR
40.0000 mg | Freq: Two times a day (BID) | INTRAVENOUS | Status: DC
  Administered 2018-07-03: 40 mg via INTRAVENOUS

## 2018-07-03 MED ORDER — SERTRALINE HCL 50 MG PO TABS
25.0000 mg | ORAL_TABLET | Freq: Every day | ORAL | Status: DC
  Administered 2018-07-03 – 2018-07-04 (×2): 25 mg via ORAL
  Filled 2018-07-03 (×2): qty 1

## 2018-07-03 MED ORDER — ONDANSETRON HCL 4 MG/2ML IJ SOLN: 4.0000 mg | Freq: Four times a day (QID) | INTRAMUSCULAR | Status: DC | PRN

## 2018-07-03 MED ORDER — POTASSIUM CHLORIDE ER 10 MEQ PO TBCR
10.0000 meq | EXTENDED_RELEASE_TABLET | Freq: Two times a day (BID) | ORAL | Status: DC
  Filled 2018-07-03: qty 1

## 2018-07-03 MED ORDER — ACETAMINOPHEN 500 MG PO TABS
500.0000 mg | ORAL_TABLET | Freq: Two times a day (BID) | ORAL | Status: DC
  Administered 2018-07-03 – 2018-07-06 (×6): 500 mg via ORAL
  Filled 2018-07-03 (×6): qty 1

## 2018-07-03 MED ORDER — ONDANSETRON HCL 4 MG PO TABS: 4.0000 mg | ORAL_TABLET | Freq: Four times a day (QID) | ORAL | Status: DC | PRN

## 2018-07-03 MED ORDER — CYCLOSPORINE 0.05 % OP EMUL
1.0000 [drp] | Freq: Two times a day (BID) | OPHTHALMIC | Status: DC
  Administered 2018-07-04 – 2018-07-06 (×3): 1 [drp] via OPHTHALMIC
  Filled 2018-07-03 (×6): qty 1

## 2018-07-03 MED ORDER — TRAMADOL HCL 50 MG PO TABS: 50.0000 mg | ORAL_TABLET | Freq: Three times a day (TID) | ORAL | Status: DC | PRN

## 2018-07-03 MED ORDER — OMEGA-3-ACID ETHYL ESTERS 1 G PO CAPS
1.0000 g | ORAL_CAPSULE | Freq: Two times a day (BID) | ORAL | Status: DC
  Administered 2018-07-03 – 2018-07-06 (×5): 1 g via ORAL
  Filled 2018-07-03 (×5): qty 1

## 2018-07-03 MED ORDER — SODIUM CHLORIDE 0.9 % IV SOLN
8.0000 mg/h | INTRAVENOUS | Status: DC
  Administered 2018-07-03 – 2018-07-05 (×5): 8 mg/h via INTRAVENOUS
  Filled 2018-07-03 (×5): qty 80

## 2018-07-03 MED ORDER — CYCLOSPORINE 0.05 % OP EMUL
1.0000 [drp] | Freq: Two times a day (BID) | OPHTHALMIC | Status: DC | PRN
  Filled 2018-07-03: qty 1

## 2018-07-03 MED ORDER — AMLODIPINE BESYLATE 5 MG PO TABS
5.0000 mg | ORAL_TABLET | Freq: Two times a day (BID) | ORAL | Status: DC
  Administered 2018-07-03 – 2018-07-04 (×3): 5 mg via ORAL
  Filled 2018-07-03 (×3): qty 1

## 2018-07-03 MED ORDER — CYCLOSPORINE 0.05 % OP EMUL
1.0000 [drp] | Freq: Two times a day (BID) | OPHTHALMIC | Status: DC
  Administered 2018-07-03: 1 [drp] via OPHTHALMIC
  Filled 2018-07-03 (×3): qty 1

## 2018-07-03 MED ORDER — POTASSIUM CHLORIDE CRYS ER 10 MEQ PO TBCR
10.0000 meq | EXTENDED_RELEASE_TABLET | Freq: Two times a day (BID) | ORAL | Status: DC
  Administered 2018-07-03 – 2018-07-06 (×5): 10 meq via ORAL
  Filled 2018-07-03 (×4): qty 1

## 2018-07-03 MED ORDER — DIAZEPAM 5 MG PO TABS: 2.5000 mg | ORAL_TABLET | Freq: Two times a day (BID) | ORAL | Status: DC | PRN

## 2018-07-03 MED ORDER — SODIUM CHLORIDE 0.9 % IV SOLN
INTRAVENOUS | Status: DC
  Administered 2018-07-03 – 2018-07-05 (×5): via INTRAVENOUS

## 2018-07-03 MED ORDER — DEXTROMETHORPHAN-GUAIFENESIN 10-100 MG/5ML PO LIQD
10.0000 mL | ORAL | Status: DC | PRN
  Filled 2018-07-03: qty 10

## 2018-07-03 MED ORDER — SODIUM CHLORIDE 0.9 % IV SOLN
10.0000 mL/h | Freq: Once | INTRAVENOUS | Status: AC
  Administered 2018-07-04: 10 mL/h via INTRAVENOUS

## 2018-07-03 MED ORDER — VITAMIN B-12 1000 MCG PO TABS
500.0000 ug | ORAL_TABLET | Freq: Every day | ORAL | Status: DC
  Administered 2018-07-03 – 2018-07-06 (×4): 500 ug via ORAL
  Filled 2018-07-03 (×4): qty 1

## 2018-07-03 MED ORDER — ACETAMINOPHEN 325 MG PO TABS: 325.0000 mg | ORAL_TABLET | ORAL | Status: DC | PRN

## 2018-07-03 MED ORDER — ACETAMINOPHEN 500 MG PO TABS
1000.0000 mg | ORAL_TABLET | Freq: Once | ORAL | Status: AC
  Administered 2018-07-03: 1000 mg via ORAL
  Filled 2018-07-03: qty 2

## 2018-07-03 MED ORDER — LATANOPROST 0.005 % OP SOLN
1.0000 [drp] | Freq: Every day | OPHTHALMIC | Status: DC
  Administered 2018-07-04 – 2018-07-05 (×2): 1 [drp] via OPHTHALMIC
  Filled 2018-07-03 (×2): qty 2.5

## 2018-07-03 MED ORDER — ADULT MULTIVITAMIN W/MINERALS CH
1.0000 | ORAL_TABLET | Freq: Every day | ORAL | Status: DC
  Administered 2018-07-03 – 2018-07-06 (×4): 1 via ORAL
  Filled 2018-07-03 (×5): qty 1

## 2018-07-03 MED ORDER — PANTOPRAZOLE SODIUM 40 MG IV SOLR
INTRAVENOUS | Status: AC
  Filled 2018-07-03: qty 40

## 2018-07-03 MED ORDER — SODIUM CHLORIDE 0.9 % IV SOLN
80.0000 mg | Freq: Once | INTRAVENOUS | Status: AC
  Administered 2018-07-03: 80 mg via INTRAVENOUS
  Filled 2018-07-03: qty 80

## 2018-07-03 NOTE — Progress Notes (Signed)
Advanced care plan.  Purpose of the Encounter: CODE STATUS  Parties in Attendance: Patient herself  Patient's Decision Capacity: Intact  Subjective/Patient's story: Patient is a 84 year old with Crohn's disease with chronic diarrhea presenting with upper GI bleed.  Discussed with the patient regarding her CODE STATUS and desires for cardiac and pulmonary resuscitation    Objective/Medical story Patient states that she would like to remain DNR.   Goals of care determination:  Continue DNR status   CODE STATUS: DNR Time spent discussing advanced care planning: 16 minutes

## 2018-07-03 NOTE — Progress Notes (Signed)
   07/03/18 1600  Clinical Encounter Type  Visited With Patient  Visit Type Follow-up;Spiritual support  Referral From Nurse  Consult/Referral To Chaplain Lavina Hamman)  Recommendations Follow-up on 07/04/2018  Spiritual Encounters  Spiritual Needs Emotional   Chaplain met with the patient on the third attempt. She is tired, groggy, and not sure of the time of day. Chaplain chatted with the patient about [facility] and her extended family. She expressed that she is not clear about what happened after EMS brought her to the hospital. Chaplain notified the patient's nurse, Seth Bake.  Patient did not have energy for a visit today and asked that a chaplain return tomorrow.

## 2018-07-03 NOTE — ED Notes (Signed)
Pt turned, dried, and repositioned

## 2018-07-03 NOTE — H&P (Signed)
[facility] [place] NAME: Sabrina Hall    MR#:  [id number]  DATE OF BIRTH:  12-24-1922  DATE OF ADMISSION:  07/03/2018  PRIMARY CARE PHYSICIAN: Venia Carbon, MD   REQUESTING/REFERRING PHYSICIAN: Kermit Balo MD CHIEF COMPLAINT:   Chief Complaint  Patient presents with  . Blood In Stools  . Fall    HISTORY OF PRESENT ILLNESS: Sabrina Hall  is a 84 y.o. female with a known history of osteoarthritis, cataract, Crohn's disease, essential hypertension, psoriasis, history of B12 deficiency who was recently hospitalized for dehydration and acute renal failure.  Patient has had diarrhea recently which has been going on for the past few weeks.  She she was recently hospitalized for dehydration and acute renal failure.  Patient now presents after fall she is noticed to have a drop in her hemoglobin as well as she is noted to be anemic.  She is also noticed to have tarry stool states that she does not remember seeing dark-colored stools.  She otherwise denies any chest pain palpitation or shortness of breath complains of generalized weakness.  PAST MEDICAL HISTORY:   Past Medical History:  Diagnosis Date  . Arthritis   . Cataract   . Crohn's   . Deviated septum   . Eczema   . H/O seasonal allergies   . History of hemorrhoids   . History of shingles   . Hypertension   . Psoriasis   . Vitamin B12 deficiency     PAST SURGICAL HISTORY:  Past Surgical History:  Procedure Laterality Date  . ABDOMINAL HYSTERECTOMY    . basel cell carcinoma    . CATARACT EXTRACTION     right eye  . CHOLECYSTECTOMY    . HYSTERECTOMY    . ILEOCECOSTOMY    . MOHS SURGERY    . NASAL SEPTUM SURGERY    . NECK SURGERY  2004   mass removed from cervical spine  . NOSE SURGERY     basal cell carcinoma of nose    SOCIAL HISTORY:  Social History   Tobacco Use  . Smoking status: Former Smoker    Packs/day: 0.50    Types: Cigarettes    Last  attempt to quit: 09/09/1981    Years since quitting: 36.8  . Smokeless tobacco: Never Used  Substance Use Topics  . Alcohol use: Yes    Comment: occassionally    FAMILY HISTORY:  Family History  Problem Relation Age of Onset  . Stroke Father   . Cancer Neg Hx   . Diabetes Neg Hx     DRUG ALLERGIES:  Allergies  Allergen Reactions  . Lactose Dermatitis  . Maxzide [Hydrochlorothiazide W-Triamterene] Other (See Comments)    causes electrolyte disturbance  . Nsaids Other (See Comments)    Reaction: unknown  . Sulfonamide Derivatives Other (See Comments)    Reaction: unknown  . Penicillins Rash and Other (See Comments)    Reaction pulled from Care Everywhere Has patient had a PCN reaction causing immediate rash, facial/tongue/throat swelling, SOB or lightheadedness with hypotension: Unknown Has patient had a PCN reaction causing severe rash involving mucus membranes or skin necrosis: Unknown Has patient had a PCN reaction that required hospitalization: Unknown Has patient had a PCN reaction occurring within the last 10 years: Unknown If all of the above answers are "NO", then may proceed with Cephalosporin use.     REVIEW OF SYSTEMS:   CONSTITUTIONAL: No fever, fatigue or positive weakness.  EYES:  No blurred or double vision.  EARS, NOSE, AND THROAT: No tinnitus or ear pain.  RESPIRATORY: No cough, shortness of breath, wheezing or hemoptysis.  CARDIOVASCULAR: No chest pain, orthopnea, edema.  GASTROINTESTINAL: No nausea, vomiting, diarrhea or abdominal pain.  GENITOURINARY: No dysuria, hematuria.  ENDOCRINE: No polyuria, nocturia,  HEMATOLOGY: No anemia, easy bruising or bleeding SKIN: No rash or lesion. MUSCULOSKELETAL: No joint pain or arthritis.   NEUROLOGIC: No tingling, numbness, weakness.  PSYCHIATRY: No anxiety or depression.   MEDICATIONS AT HOME:  Prior to Admission medications   Medication Sig Start Date End Date Taking? Authorizing Provider  acetaminophen  (TYLENOL) 325 MG tablet Take 325-650 mg by mouth every 4 (four) hours as needed for fever (general discomfort).    [provider]  acetaminophen (TYLENOL) 500 MG tablet Take 500 mg by mouth 2 (two) times daily.    [provider]  alum & mag hydroxide-simeth (MAALOX/MYLANTA) 200-200-20 MG/5ML suspension Take 30 mLs by mouth every 4 (four) hours as needed for indigestion or flatulence (upset stomach).    [provider]  amLODipine (NORVASC) 5 MG tablet TAKE ONE TABLET BY MOUTH TWICE DAILY 08/27/15   Minna Merritts, MD  aspirin 81 MG tablet Take 81 mg by mouth daily.    [provider]  balsalazide (COLAZAL) 750 MG capsule Take 1,500 mg by mouth 2 (two) times daily.    [provider]  bismuth subsalicylate (PEPTO BISMOL) 262 MG/15ML suspension Take 10 mLs by mouth 3 (three) times daily as needed for diarrhea or loose stools.    [provider]  carbamide peroxide (DEBROX) 6.5 % OTIC solution Place 5 drops into both ears daily as needed (cerumen impaction). Instill x5 days    [provider]  Cyanocobalamin (VITAMIN B-12) 500 MCG SUBL Place 500 mcg under the tongue daily.    [provider]  cycloSPORINE (RESTASIS) 0.05 % ophthalmic emulsion Place 1 drop into both eyes 2 (two) times daily.    [provider]  cycloSPORINE (RESTASIS) 0.05 % ophthalmic emulsion Place 1 drop into both eyes 2 (two) times daily as needed (dry eyes).    [provider]  dextromethorphan-guaiFENesin (ROBITUSSIN-DM) 10-100 MG/5ML liquid Take 10 mLs by mouth every 4 (four) hours as needed for cough.    [provider]  diazepam (VALIUM) 5 MG tablet Take 2.5 mg by mouth every 12 (twelve) hours as needed for anxiety.     [provider]  dicyclomine (BENTYL) 10 MG capsule Take 10 mg by mouth 4 (four) times daily as needed (diarrhea/ abdominal pain).    [provider]  ferrous sulfate 325 (65 FE) MG EC tablet  Take 1 tablet (325 mg total) by mouth 2 (two) times daily. 06/22/18 06/22/19  Vaughan Basta, MD  fish oil-omega-3 fatty acids 1000 MG capsule Take 1 g by mouth 2 (two) times daily.     [provider]  lactase (LACTAID) 3000 units tablet Take 6,000 Units by mouth 3 (three) times daily with meals.    [provider]  latanoprost (XALATAN) 0.005 % ophthalmic solution Place 1 drop into both eyes at bedtime. 03/23/17   [provider]  loperamide (IMODIUM) 2 MG capsule Take 4 mg by mouth 2 (two) times daily as needed for diarrhea or loose stools.     [provider]  magnesium hydroxide (MILK OF MAGNESIA) 400 MG/5ML suspension Take 30 mLs by mouth daily as needed for mild constipation.    [provider]  magnesium oxide (MAG-OX) 400 MG tablet Take 200 mg by mouth daily.     [provider]  Melatonin 3 MG TABS Take 1 tablet by mouth at bedtime.    [provider]  Multiple Vitamin (MULTIVITAMIN) tablet Take 1 tablet by mouth daily.      [provider]  nystatin (NYSTATIN) powder Apply 1 g topically 2 (two) times daily as needed (rash).    [provider]  ondansetron (ZOFRAN) 4 MG tablet Take 4 mg by mouth every 8 (eight) hours as needed for nausea or vomiting.    [provider]  potassium chloride (K-DUR) 10 MEQ tablet Take 1 tablet (10 mEq total) by mouth 2 (two) times daily. 06/22/18   Vaughan Basta, MD  sertraline (ZOLOFT) 25 MG tablet Take 1 tablet (25 mg total) by mouth daily. 05/27/18   Venia Carbon, MD  traMADol (ULTRAM) 50 MG tablet Take 1 tablet (50 mg total) by mouth every 8 (eight) hours as needed for moderate pain. 06/12/18   Fritzi Mandes, MD      PHYSICAL EXAMINATION:   VITAL SIGNS: Blood pressure (!) 124/58, pulse 77, temperature 98 F (36.7 C), temperature source Oral, resp. rate (!) 21, height 5' 2"  (1.575 m), weight 49.4 kg, SpO2 (!) 79 %.  GENERAL:  84 y.o.-year-old  patient lying in the bed with no acute distress.  EYES: Pupils equal, round, reactive to light and accommodation. No scleral icterus. Extraocular muscles intact.  Pale conjunctiva HEENT: Head atraumatic, normocephalic. Oropharynx and nasopharynx clear.  NECK:  Supple, no jugular venous distention. No thyroid enlargement, no tenderness.  LUNGS: Normal breath sounds bilaterally, no wheezing, rales,rhonchi or crepitation. No use of accessory muscles of respiration.  CARDIOVASCULAR: S1, S2 normal. No murmurs, rubs, or gallops.  ABDOMEN: Soft, nontender, nondistended. Bowel sounds present. No organomegaly or mass.  EXTREMITIES: No pedal edema, cyanosis, or clubbing.  NEUROLOGIC: Cranial nerves II through XII are intact. Muscle strength 5/5 in all extremities. Sensation intact. Gait not checked.  PSYCHIATRIC: The patient is alert and oriented x 3.  SKIN: No obvious rash, lesion, or ulcer.   LABORATORY PANEL:   CBC Recent Labs  Lab 07/03/18 0756  WBC 9.9  HGB 6.8*  HCT 20.9*  PLT 187  MCV 87.3  MCH 28.5  MCHC 32.6  RDW 21.0*   ------------------------------------------------------------------------------------------------------------------  Chemistries  Recent Labs  Lab 07/03/18 0756  NA 138  K 4.0  CL 106  CO2 23  GLUCOSE 109*  BUN 42*  CREATININE 0.74  CALCIUM 8.1*   ------------------------------------------------------------------------------------------------------------------ estimated creatinine clearance is 35 mL/min (by C-G formula based on SCr of 0.74 mg/dL). ------------------------------------------------------------------------------------------------------------------ No results for input(s): TSH, T4TOTAL, T3FREE, THYROIDAB in the last 72 hours.  Invalid input(s): FREET3   Coagulation profile No results for input(s): INR, PROTIME in the last 168  hours. ------------------------------------------------------------------------------------------------------------------- No results for input(s): DDIMER in the last 72 hours. -------------------------------------------------------------------------------------------------------------------  Cardiac Enzymes No results for input(s): CKMB, TROPONINI, MYOGLOBIN in the last 168 hours.  Invalid input(s): CK ------------------------------------------------------------------------------------------------------------------ Invalid input(s): POCBNP  ---------------------------------------------------------------------------------------------------------------  Urinalysis    Component Value Date/Time   COLORURINE AMBER (A) 06/19/2018 1330   APPEARANCEUR CLOUDY (A) 06/19/2018 1330   APPEARANCEUR Hazy 10/07/2014 1323   LABSPEC 1.012 06/19/2018 1330   LABSPEC 1.011 10/07/2014 1323   PHURINE 5.0 06/19/2018 1330   GLUCOSEU NEGATIVE 06/19/2018 1330   GLUCOSEU 150 mg/dL 10/07/2014 1323   HGBUR NEGATIVE 06/19/2018 1330   BILIRUBINUR NEGATIVE 06/19/2018 1330   BILIRUBINUR Negative 10/07/2014  [place] 06/19/2018 1330   PROTEINUR 30 (A) 06/19/2018 1330   NITRITE NEGATIVE 06/19/2018 1330   LEUKOCYTESUR LARGE (A) 06/19/2018 1330   LEUKOCYTESUR 2+ 10/07/2014 1323     RADIOLOGY: Ct Head Wo Contrast  Result Date: 07/03/2018 CLINICAL DATA:  Minor head trauma from a fall today. EXAM: CT HEAD WITHOUT CONTRAST TECHNIQUE: Contiguous axial images were obtained from the base of the skull through the vertex without intravenous contrast. COMPARISON:  06/08/2018. FINDINGS: Brain: Stable moderately enlarged ventricles and subarachnoid spaces. Stable moderate patchy white matter low density in both cerebral hemispheres. No intracranial hemorrhage, mass lesion or CT evidence of acute infarction. Vascular: No hyperdense vessel or unexpected calcification. Skull: Normal. Negative for fracture or  focal lesion. Sinuses/Orbits: Mild right anterior ethmoid sinus mucosal thickening. The included orbits are unremarkable. Other: None. IMPRESSION: 1. No acute abnormality. 2. Stable atrophy and chronic small vessel white matter ischemic changes. Electronically Signed   By: Claudie Revering M.D.   On: 07/03/2018 09:06    EKG: Orders placed or performed during the hospital encounter of 07/03/18  . EKG 12-Lead  . EKG 12-Lead    IMPRESSION AND PLAN: Patient is 84 year old being admitted with dark tarry stools and drop in hemoglobin  1.  Upper GI bleed continue Protonix drip Plan for 2 units of packed RBCs I have notified GI of the admission  2.  Chronic diarrhea I due to GI bleed I will hold her current regimen   3.  Essential hypertension blood pressure currently stable continue home regimen we will hold if needed   4.  History of Crohn's disease with GI bleed I will hold her current medication   5.  Glaucoma continue eyedrops  6.  Miscellaneous DVT prophylaxis with SCDs All the records are reviewed and case discussed with ED provider. Management plans discussed with the patient, family and they are in agreement.  CODE STATUS: Code Status History    Date Active Date Inactive Code Status Order ID Comments User Context   06/19/2018 1710 06/22/2018 1643 DNR [id number]  Dustin Flock, MD Inpatient   06/09/2018 0411 06/12/2018 1715 DNR [id number]  Harrie Foreman, MD Inpatient   06/07/2018 0105 06/07/2018 2054 DNR [id number]  Lance Coon, MD Inpatient   03/25/2017 0305 03/27/2017 1830 DNR [id number]  Saundra Shelling, MD Inpatient   03/25/2017 0205 03/25/2017 0305 Full Code [id number]  Hugelmeyer, Ubaldo Glassing, DO ED    Questions for Most Recent Historical Code Status (Order [id number])    Question Answer Comment   In the event of cardiac or respiratory ARREST Do not call a "code blue"    In the event of cardiac or respiratory ARREST Do not perform Intubation, CPR, defibrillation or ACLS    In the event  of cardiac or respiratory ARREST Use medication by any route, position, wound care, and other measures to relive pain and suffering. May use oxygen, suction and manual treatment of airway obstruction as needed for comfort.         Advance Directive Documentation     Most Recent Value  Type of Advance Directive  Out of facility DNR (pink MOST or yellow form)  Pre-existing out of facility DNR order (yellow form or pink MOST form)  -  "MOST" Form in Place?  -       TOTAL TIME TAKING CARE OF THIS PATIENT:3mnutes.    SDustin FlockM.D on 07/03/2018 at 9:23 AM  Between 7am to 6pm - Pager - [phone]  After 6pm  go to [email or web address] - password [email or web address]  [facility] Office  [phone]  CC: Primary care physician; Venia Carbon, MD

## 2018-07-03 NOTE — NC FL2 (Signed)
[place] LEVEL OF CARE SCREENING TOOL     IDENTIFICATION  Patient Name: Sabrina Hall Birthdate: 10-Sep-1923 Sex: female Admission Date (Current Location): 07/03/2018  [place] and [place] Number:  Engineering geologist and Address:  [facility], [address] [place], [place] [address]      Provider Number: [id number]  Attending Physician Name and Address:  Dustin Flock, MD  Relative Name and Phone Number:       Current Level of Care: Hospital Recommended Level of Care: [place] Prior Approval Number:    Date Approved/Denied:   PASRR Number: [id number]  Discharge Plan: SNF    Current Diagnoses: Patient Active Problem List   Diagnosis Date Noted  . Upper GI bleed 07/03/2018  . ARF (acute renal failure) ([place]) 06/19/2018  . Humerus fracture 06/09/2018  . Anxiety 06/06/2018  . Hyponatremia 06/06/2018  . Acute urinary retention 06/06/2018  . Vitamin B12 deficiency   . Mood disorder ([place]) 01/31/2016  . Sleep disorder 12/07/2015  . POLYP, COLON 11/22/2007  . Essential hypertension 11/22/2007  . Crohn's disease ([place]) 11/22/2007  . DIVERTICULITIS OF COLON 11/22/2007  . Generalized osteoarthritis of multiple sites 11/22/2007  . SKIN CANCER, HX OF 11/22/2007    Orientation RESPIRATION BLADDER Height & Weight     Self, Time, Situation, Place  Normal Continent Weight: 109 lb (49.4 kg) Height:  5' 2"  (157.5 cm)  BEHAVIORAL SYMPTOMS/MOOD NEUROLOGICAL BOWEL NUTRITION STATUS  (none) (none) Continent Diet(heart healthy)  AMBULATORY STATUS COMMUNICATION OF NEEDS Skin   Extensive Assist Verbally Normal                       Personal Care Assistance Level of Assistance  Bathing, Feeding, Dressing Bathing Assistance: Maximum assistance Feeding assistance: Maximum assistance Dressing Assistance: Maximum assistance     Functional Limitations Info  Hearing, Sight, Speech Sight Info: Adequate Hearing  Info: Impaired Speech Info: Adequate    SPECIAL CARE FACTORS FREQUENCY  PT (By licensed PT), OT (By licensed OT)     PT Frequency: x5 OT Frequency: x5            Contractures Contractures Info: Not present    Additional Factors Info  Code Status Code Status Info: DNR Allergies Info: Lactose,maxide,nsaids,sulpha derivitives,pcn           Current Medications (07/03/2018):  This is the current hospital active medication list Current Facility-Administered Medications  Medication Dose Route Frequency Provider Last Rate Last Dose  . 0.9 %  sodium chloride infusion  10 mL/hr Intravenous Once Alfred Levins, [place], MD      . pantoprazole (PROTONIX) 80 mg in sodium chloride 0.9 % 250 mL (0.32 mg/mL) infusion  8 mg/hr Intravenous Continuous Alfred Levins, [place], MD 25 mL/hr at 07/03/18 1012 8 mg/hr at 07/03/18 1012  . [START ON 07/06/2018] pantoprazole (PROTONIX) injection 40 mg  40 mg Intravenous Q12H Alfred Levins, [place], MD   40 mg at 07/03/18 0908   Current Outpatient Medications  Medication Sig Dispense Refill  . acetaminophen (TYLENOL) 325 MG tablet Take 325-650 mg by mouth every 4 (four) hours as needed for fever (general discomfort).    Marland Kitchen acetaminophen (TYLENOL) 500 MG tablet Take 500 mg by mouth 2 (two) times daily.    Marland Kitchen alum & mag hydroxide-simeth (MAALOX/MYLANTA) 200-200-20 MG/5ML suspension Take 30 mLs by mouth every 4 (four) hours as needed for indigestion or flatulence (upset stomach).    Marland Kitchen aspirin 81 MG chewable tablet Chew 81 mg by  mouth daily.     . balsalazide (COLAZAL) 750 MG capsule Take 1,500 mg by mouth 2 (two) times daily.    Marland Kitchen bismuth subsalicylate (PEPTO BISMOL) 262 MG/15ML suspension Take 10 mLs by mouth 3 (three) times daily as needed for diarrhea or loose stools.    . carbamide peroxide (DEBROX) 6.5 % OTIC solution Place 5 drops into both ears daily as needed (cerumen impaction). Instill x5 days    . Cyanocobalamin (VITAMIN B-12) 500 MCG SUBL Place 500 mcg under the  tongue daily.    . cycloSPORINE (RESTASIS) 0.05 % ophthalmic emulsion Place 1 drop into both eyes 2 (two) times daily.    . cycloSPORINE (RESTASIS) 0.05 % ophthalmic emulsion Place 1 drop into both eyes 2 (two) times daily as needed (dry eyes).    . ferrous sulfate 325 (65 FE) MG EC tablet Take 1 tablet (325 mg total) by mouth 2 (two) times daily. 60 tablet 3  . guaifenesin (ROBITUSSIN) 100 MG/5ML syrup Take 200 mg by mouth 4 (four) times daily as needed for cough.    . lactase (LACTAID) 3000 units tablet Take 6,000 Units by mouth 3 (three) times daily with meals.    . latanoprost (XALATAN) 0.005 % ophthalmic solution Place 1 drop into both eyes at bedtime.    Marland Kitchen loperamide (IMODIUM) 2 MG capsule Take 4 mg by mouth 2 (two) times daily as needed for diarrhea or loose stools.     . magnesium hydroxide (MILK OF MAGNESIA) 400 MG/5ML suspension Take 30 mLs by mouth daily as needed for mild constipation.    . magnesium oxide (MAG-OX) 400 MG tablet Take 200 mg by mouth daily.     . Melatonin 3 MG TABS Take 1 tablet by mouth at bedtime.    . Multiple Vitamin (MULTIVITAMIN) tablet Take 1 tablet by mouth daily.      Marland Kitchen nystatin (NYSTATIN) powder Apply 1 g topically 2 (two) times daily as needed (rash).    . ondansetron (ZOFRAN) 4 MG tablet Take 4 mg by mouth every 8 (eight) hours as needed for nausea or vomiting.    . potassium chloride (K-DUR) 10 MEQ tablet Take 1 tablet (10 mEq total) by mouth 2 (two) times daily. 60 tablet 0  . traMADol (ULTRAM) 50 MG tablet Take 1 tablet (50 mg total) by mouth every 8 (eight) hours as needed for moderate pain. 20 tablet 0  . amLODipine (NORVASC) 5 MG tablet TAKE ONE TABLET BY MOUTH TWICE DAILY (Patient not taking: Reported on 07/03/2018) 60 tablet 3  . dextromethorphan-guaiFENesin (ROBITUSSIN-DM) 10-100 MG/5ML liquid Take 10 mLs by mouth every 4 (four) hours as needed for cough.    . diazepam (VALIUM) 5 MG tablet Take 2.5 mg by mouth every 12 (twelve) hours as needed for  anxiety.     . dicyclomine (BENTYL) 10 MG capsule Take 10 mg by mouth 4 (four) times daily as needed (diarrhea/ abdominal pain).    . fish oil-omega-3 fatty acids 1000 MG capsule Take 1 g by mouth 2 (two) times daily.     . sertraline (ZOLOFT) 25 MG tablet Take 1 tablet (25 mg total) by mouth daily. (Patient not taking: Reported on 07/03/2018) 30 tablet 11     Discharge Medications: Please see discharge summary for a list of discharge medications.  Relevant Imaging Results:  Relevant Lab Results:   Additional Information [id number]  Joana Reamer, [place]

## 2018-07-03 NOTE — Clinical Social Work Note (Signed)
Clinical Social Work Assessment  Patient Details  Name: Sabrina Hall MRN: [id number] Date of Birth: 1922-10-03  Date of referral:  07/03/18               Reason for consult:  Facility Placement                Permission sought to share information with:  Facility Sport and exercise psychologist, Family Supports Permission granted to share information::  Yes, Verbal Permission Granted  Name::        Agency::     Relationship::     Contact Information:     Housing/Transportation Living arrangements for the past 2 months:  [place] of Information:  Patient, Facility Patient Interpreter Needed:  None Criminal Activity/Legal Involvement Pertinent to Current Situation/Hospitalization:  No - Comment as needed Significant Relationships:  Adult Children Lives with:  Facility Resident Do you feel safe going back to the place where you live?  Yes Need for family participation in patient care:  Yes (Comment)  Care giving concerns:  TBD   Social Worker assessment / plan: LCSW reviewed recent notes and completed assessment. Patient will be admitted and will likely follow up with Seth Bake at [facility]. Patient was just here on Sept 23/19.   Employment status:  Retired Nurse, adult PT Recommendations:  Not assessed at this time [place] / Referral to community resources:  [place]  Patient/Family's Response to care:  TBD  Patient/Family's Understanding of and Emotional Response to Diagnosis, Current Treatment, and Prognosis:  Patient has a good understanding she needs more medical treatment.  Emotional Assessment Appearance:  Appears stated age Attitude/Demeanor/Rapport:  Gracious Affect (typically observed):  Accepting, Adaptable, Calm Orientation:  Oriented to Self, Oriented to Place Alcohol / Substance use:  Not Applicable Psych involvement (Current and /or in the community):  No (Comment)  Discharge Needs   Concerns to be addressed:  Care Coordination Readmission within the last 30 days:  Yes(Just here on Sept 23rd) Current discharge risk:  None Barriers to Discharge:  Continued Medical Work up   [employer], LCSW 07/03/2018, 10:48 AM

## 2018-07-03 NOTE — ED Provider Notes (Signed)
[facility] Emergency Department Provider Note  ____________________________________________  Time seen: Approximately 8:02 AM  I have reviewed the triage vital signs and the nursing notes.   HISTORY  Chief Complaint Blood In Stools and Fall  Level 5 caveat:  Portions of the history and physical were unable to be obtained due to dementia   HPI Sabrina Hall is a 84 y.o. female with history of Crohn's disease, hypertension, and mild dementia who presents for evaluation of fall and bloody stools. Patient had unwitnessed fall this am. Patient thinks that she lost her balance and fell on her bottom but she is not sure. She does not believe she hit her head. She says she "was trying to walk across the room by myself which I know I am not suppose to do and I just fell". She denies headache, neck pain, back pain, extremity pain, chest pain, abdominal pain or shortness of breath.  The staff this morning while he was assisting her out of the floor noticed that she had bloody stools.  Unknown for how long this has been an issue.  Patient denies vomiting or nausea.  Past Medical History:  Diagnosis Date  . Arthritis   . Cataract   . Crohn's   . Deviated septum   . Eczema   . H/O seasonal allergies   . History of hemorrhoids   . History of shingles   . Hypertension   . Psoriasis   . Vitamin B12 deficiency     Patient Active Problem List   Diagnosis Date Noted  . ARF (acute renal failure) ([place]) 06/19/2018  . Humerus fracture 06/09/2018  . Anxiety 06/06/2018  . Hyponatremia 06/06/2018  . Acute urinary retention 06/06/2018  . Vitamin B12 deficiency   . Mood disorder ([place]) 01/31/2016  . Sleep disorder 12/07/2015  . POLYP, COLON 11/22/2007  . Essential hypertension 11/22/2007  . Crohn's disease ([place]) 11/22/2007  . DIVERTICULITIS OF COLON 11/22/2007  . Generalized osteoarthritis of multiple sites 11/22/2007  . SKIN CANCER, HX OF 11/22/2007    Past  Surgical History:  Procedure Laterality Date  . ABDOMINAL HYSTERECTOMY    . basel cell carcinoma    . CATARACT EXTRACTION     right eye  . CHOLECYSTECTOMY    . HYSTERECTOMY    . ILEOCECOSTOMY    . MOHS SURGERY    . NASAL SEPTUM SURGERY    . NECK SURGERY  2004   mass removed from cervical spine  . NOSE SURGERY     basal cell carcinoma of nose    Prior to Admission medications   Medication Sig Start Date End Date Taking? Authorizing Provider  acetaminophen (TYLENOL) 325 MG tablet Take 325-650 mg by mouth every 4 (four) hours as needed for fever (general discomfort).    [provider]  acetaminophen (TYLENOL) 500 MG tablet Take 500 mg by mouth 2 (two) times daily.    [provider]  alum & mag hydroxide-simeth (MAALOX/MYLANTA) 200-200-20 MG/5ML suspension Take 30 mLs by mouth every 4 (four) hours as needed for indigestion or flatulence (upset stomach).    [provider]  amLODipine (NORVASC) 5 MG tablet TAKE ONE TABLET BY MOUTH TWICE DAILY 08/27/15   Minna Merritts, MD  aspirin 81 MG tablet Take 81 mg by mouth daily.    [provider]  balsalazide (COLAZAL) 750 MG capsule Take 1,500 mg by mouth 2 (two) times daily.    [provider]  bismuth subsalicylate (PEPTO BISMOL) 262  MG/15ML suspension Take 10 mLs by mouth 3 (three) times daily as needed for diarrhea or loose stools.    [provider]  carbamide peroxide (DEBROX) 6.5 % OTIC solution Place 5 drops into both ears daily as needed (cerumen impaction). Instill x5 days    [provider]  Cyanocobalamin (VITAMIN B-12) 500 MCG SUBL Place 500 mcg under the tongue daily.    [provider]  cycloSPORINE (RESTASIS) 0.05 % ophthalmic emulsion Place 1 drop into both eyes 2 (two) times daily.    [provider]  cycloSPORINE (RESTASIS) 0.05 % ophthalmic emulsion Place 1 drop into both eyes 2 (two) times daily as needed (dry eyes).    [provider]   dextromethorphan-guaiFENesin (ROBITUSSIN-DM) 10-100 MG/5ML liquid Take 10 mLs by mouth every 4 (four) hours as needed for cough.    [provider]  diazepam (VALIUM) 5 MG tablet Take 2.5 mg by mouth every 12 (twelve) hours as needed for anxiety.     [provider]  dicyclomine (BENTYL) 10 MG capsule Take 10 mg by mouth 4 (four) times daily as needed (diarrhea/ abdominal pain).    [provider]  ferrous sulfate 325 (65 FE) MG EC tablet Take 1 tablet (325 mg total) by mouth 2 (two) times daily. 06/22/18 06/22/19  Vaughan Basta, MD  fish oil-omega-3 fatty acids 1000 MG capsule Take 1 g by mouth 2 (two) times daily.     [provider]  lactase (LACTAID) 3000 units tablet Take 6,000 Units by mouth 3 (three) times daily with meals.    [provider]  latanoprost (XALATAN) 0.005 % ophthalmic solution Place 1 drop into both eyes at bedtime. 03/23/17   [provider]  loperamide (IMODIUM) 2 MG capsule Take 4 mg by mouth 2 (two) times daily as needed for diarrhea or loose stools.     [provider]  magnesium hydroxide (MILK OF MAGNESIA) 400 MG/5ML suspension Take 30 mLs by mouth daily as needed for mild constipation.    [provider]  magnesium oxide (MAG-OX) 400 MG tablet Take 200 mg by mouth daily.     [provider]  Melatonin 3 MG TABS Take 1 tablet by mouth at bedtime.    [provider]  Multiple Vitamin (MULTIVITAMIN) tablet Take 1 tablet by mouth daily.      [provider]  nystatin (NYSTATIN) powder Apply 1 g topically 2 (two) times daily as needed (rash).    [provider]  ondansetron (ZOFRAN) 4 MG tablet Take 4 mg by mouth every 8 (eight) hours as needed for nausea or vomiting.    [provider]  potassium chloride (K-DUR) 10 MEQ tablet Take 1 tablet (10 mEq total) by mouth 2 (two) times daily. 06/22/18   Vaughan Basta, MD  sertraline (ZOLOFT) 25 MG  tablet Take 1 tablet (25 mg total) by mouth daily. 05/27/18   Venia Carbon, MD  traMADol (ULTRAM) 50 MG tablet Take 1 tablet (50 mg total) by mouth every 8 (eight) hours as needed for moderate pain. 06/12/18   Fritzi Mandes, MD    Allergies Lactose; Maxzide [hydrochlorothiazide w-triamterene]; Nsaids; Sulfonamide derivatives; and Penicillins  Family History  Problem Relation Age of Onset  . Stroke Father   . Cancer Neg Hx   . Diabetes Neg Hx     Social History Social History   Tobacco Use  . Smoking status: Former Smoker    Packs/day: 0.50    Types: Cigarettes  Last attempt to quit: 09/09/1981    Years since quitting: 36.8  . Smokeless tobacco: Never Used  Substance Use Topics  . Alcohol use: Yes    Comment: occassionally  . Drug use: No    Review of Systems Constitutional: Negative for fever. Eyes: Negative for visual changes. ENT: Negative for facial injury or neck injury Cardiovascular: Negative for chest injury. Respiratory: Negative for shortness of breath. Negative for chest wall injury. Gastrointestinal: Negative for abdominal pain or injury. + bloody stools Genitourinary: Negative for dysuria. Musculoskeletal: Negative for back injury, negative for arm or leg pain. Skin: Negative for laceration/abrasions. Neurological: Negative for head injury.   ____________________________________________   PHYSICAL EXAM:  VITAL SIGNS: ED Triage Vitals  Enc Vitals Group     BP 07/03/18 0752 (!) 131/55     Pulse Rate 07/03/18 0752 97     Resp 07/03/18 0752 17     Temp 07/03/18 0752 98 F (36.7 C)     Temp Source 07/03/18 0752 Oral     SpO2 07/03/18 0752 98 %     Weight 07/03/18 0749 109 lb (49.4 kg)     Height 07/03/18 0749 5' 2"  (1.575 m)     Head Circumference --      Peak Flow --      Pain Score 07/03/18 0748 5     Pain Loc --      Pain Edu? --      Excl. in [place]? --    Full spinal precautions maintained throughout the trauma exam. Constitutional: Alert  and oriented, pale. No acute distress. Does not appear intoxicated. HEENT Head: Normocephalic and atraumatic. Face: No facial bony tenderness. Stable midface Ears: No hemotympanum bilaterally. No Battle sign Eyes: No eye injury. PERRL. No raccoon eyes Nose: Nontender. No epistaxis. No rhinorrhea Mouth/Throat: Mucous membranes are moist. No oropharyngeal blood. No dental injury. Airway patent without stridor. Normal voice. Neck: no C-collar in place. No midline c-spine tenderness.  Cardiovascular: Normal rate, regular rhythm. Normal and symmetric distal pulses are present in all extremities. Pulmonary/Chest: Chest wall is stable and nontender to palpation/compression. Normal respiratory effort. Breath sounds are normal. No crepitus.  Abdominal: Soft, nontender, non distended. Rectal exam showing melena guaiac positive Musculoskeletal: Nontender with normal full range of motion in all extremities. No deformities. No thoracic or lumbar midline spinal tenderness. Pelvis is stable. Skin: Skin is warm, dry and intact. No abrasions or contutions. Psychiatric: Speech and behavior are appropriate. Neurological: Normal speech and language. Moves all extremities to command. No gross focal neurologic deficits are appreciated.  Glascow Coma Score: 4 - Opens eyes on own 6 - Follows simple motor commands 5 - Alert and oriented GCS: 14   ____________________________________________   LABS (all labs ordered are listed, but only abnormal results are displayed)  Labs Reviewed  CBC - Abnormal; Notable for the following components:      Result Value   RBC 2.40 (*)    Hemoglobin 6.8 (*)    HCT 20.9 (*)    RDW 21.0 (*)    All other components within normal limits  BASIC METABOLIC PANEL - Abnormal; Notable for the following components:   Glucose, Bld 109 (*)    BUN 42 (*)    Calcium 8.1 (*)    All other components within normal limits  LACTIC ACID, PLASMA  PROTIME-INR  APTT  TYPE AND SCREEN    PREPARE RBC (CROSSMATCH)   ____________________________________________  EKG  ED ECG REPORT I, Rudene Re, the attending  physician, personally viewed and interpreted this ECG.  Normal sinus rhythm, rate of 99, incomplete right bundle branch block, normal QTC, borderline right axis deviation with no ST elevations or depressions. Unchanged from prior ____________________________________________  RADIOLOGY  I have personally reviewed the images performed during this visit and I agree with the Radiologist's read.   Interpretation by Radiologist:  Ct Head Wo Contrast  Result Date: 07/03/2018 CLINICAL DATA:  Minor head trauma from a fall today. EXAM: CT HEAD WITHOUT CONTRAST TECHNIQUE: Contiguous axial images were obtained from the base of the skull through the vertex without intravenous contrast. COMPARISON:  06/08/2018. FINDINGS: Brain: Stable moderately enlarged ventricles and subarachnoid spaces. Stable moderate patchy white matter low density in both cerebral hemispheres. No intracranial hemorrhage, mass lesion or CT evidence of acute infarction. Vascular: No hyperdense vessel or unexpected calcification. Skull: Normal. Negative for fracture or focal lesion. Sinuses/Orbits: Mild right anterior ethmoid sinus mucosal thickening. The included orbits are unremarkable. Other: None. IMPRESSION: 1. No acute abnormality. 2. Stable atrophy and chronic small vessel white matter ischemic changes. Electronically Signed   By: Claudie Revering M.D.   On: 07/03/2018 09:06      ____________________________________________   PROCEDURES  Procedure(s) performed: None Procedures Critical Care performed: yes  CRITICAL CARE Performed by: Rudene Re  ?  Total critical care time: 35 min  Critical care time was exclusive of separately billable procedures and treating other patients.  Critical care was necessary to treat or prevent imminent or life-threatening deterioration.  Critical  care was time spent personally by me on the following activities: development of treatment plan with patient and/or surrogate as well as nursing, discussions with consultants, evaluation of patient's response to treatment, examination of patient, obtaining history from patient or surrogate, ordering and performing treatments and interventions, ordering and review of laboratory studies, ordering and review of radiographic studies, pulse oximetry and re-evaluation of patient's condition.  ____________________________________________   INITIAL IMPRESSION / ASSESSMENT AND PLAN / ED COURSE  84 y.o. female with history of Crohn's disease, hypertension, and mild dementia who presents for evaluation of fall and bloody stools. No obvious signs of trauma on exam. Patient looks extremely pale on exam and has gross melena on rectal exam.  She is otherwise hemodynamically stable.  She is not on any blood thinners.  Will check labs including CBC, coags, BMP, and lactic.  We will send a type and screen.  Will start protonix bolus and gtt. Since the fall was not witnessed and patient has slight dementia and is unsure if she hit her head I am going to send her for CT of her head.    _________________________ 9:20 AM on 07/03/2018 -----------------------------------------  CT negative for intracranial injury.  Hemoglobin of 6.2, will give 1 unit of emergent release blood since patient is actively having melena.  2 more units from pharmacy are pending.  She remains hemodynamically stable.  Presentation most likely concerning for peptic ulcer disease versus gastritis in the setting of aspirin.  Discussed with Dr. Verdell Carmine for admission  As part of my medical decision making, I reviewed the following data within the [place] notes reviewed and incorporated, Labs reviewed , EKG interpreted , Old EKG reviewed, Old chart reviewed, Radiograph reviewed , Discussed with admitting physician , Notes from  prior ED visits and [place] Controlled Substance Database    Pertinent labs & imaging results that were available during my care of the patient were reviewed by me and considered in my medical  decision making (see chart for details).    ____________________________________________   FINAL CLINICAL IMPRESSION(S) / ED DIAGNOSES  Final diagnoses:  Fall, initial encounter  UGIB (upper gastrointestinal bleed)  Acute on chronic blood loss anemia      NEW MEDICATIONS STARTED DURING THIS VISIT:  ED Discharge Orders    None       Note:  This document was prepared using Dragon voice recognition software and may include unintentional dictation errors.    Rudene Re, MD 07/03/18 [phone]

## 2018-07-03 NOTE — ED Notes (Signed)
This nurse was reviewing chart to find reason that be had not been assigned yet and found note from Sabrina Hall that he notified this nurse at 1030am bed was ready - this phone call never occurred and report will be called on this pt now

## 2018-07-03 NOTE — ED Notes (Signed)
Blood verified with Cyndie Mull RN

## 2018-07-03 NOTE — Consult Note (Signed)
Sabrina Hall , MD [address], [place], [place], [place], [address] [address] [place], [place], [place], [address] Phone: [phone]  Fax: [phone]  Consultation  Referring Provider:  Dr Posey Pronto  Primary Care Physician:  Venia Carbon, MD Primary Gastroenterologist:  [facility]          Reason for Consultation:    GI bleed  Date of Admission:  07/03/2018 Date of Consultation:  07/03/2018         HPI:   Sabrina Hall is a 84 y.o. female is a patient of [facility] was last seen in July 2018.  Her last GI note she has a history of Crohn's disease and has been on prednisone.  In the past she has been on Lialda.  Status post hemicolectomy.  She presented to the hospital earlier today with diarrhea.  To have a drop in her hemoglobin and being anemic.  She has been on Pepto-Bismol as per her medication record.  12 days back her hemoglobin was 10 g.  On admission her hemoglobin was 6.8 g.  With an MCV of 87.3.  She says that her stool has been dark brown in color the last few days.  Denies being black in color.  She says that she takes aspirin but could not clearly tell me if she took any other NSAIDs.  Denies any abdominal pain discomfort at this point of time.  Appeared very comfortable in her bed.  Past Medical History:  Diagnosis Date  . Arthritis   . Cataract   . Crohn's   . Deviated septum   . Eczema   . H/O seasonal allergies   . History of hemorrhoids   . History of shingles   . Hypertension   . Psoriasis   . Vitamin B12 deficiency     Past Surgical History:  Procedure Laterality Date  . ABDOMINAL HYSTERECTOMY    . basel cell carcinoma    . CATARACT EXTRACTION     right eye  . CHOLECYSTECTOMY    . HYSTERECTOMY    . ILEOCECOSTOMY    . MOHS SURGERY    . NASAL SEPTUM SURGERY    . NECK SURGERY  2004   mass removed from cervical spine  . NOSE SURGERY     basal cell carcinoma of nose    Prior to Admission medications   Medication Sig Start  Date End Date Taking? Authorizing Provider  acetaminophen (TYLENOL) 325 MG tablet Take 325-650 mg by mouth every 4 (four) hours as needed for fever (general discomfort).   Yes [provider]  acetaminophen (TYLENOL) 500 MG tablet Take 500 mg by mouth 2 (two) times daily.   Yes [provider]  alum & mag hydroxide-simeth (MAALOX/MYLANTA) 200-200-20 MG/5ML suspension Take 30 mLs by mouth every 4 (four) hours as needed for indigestion or flatulence (upset stomach).   Yes [provider]  aspirin 81 MG chewable tablet Chew 81 mg by mouth daily.    Yes [provider]  balsalazide (COLAZAL) 750 MG capsule Take 1,500 mg by mouth 2 (two) times daily.   Yes [provider]  bismuth subsalicylate (PEPTO BISMOL) 262 MG/15ML suspension Take 10 mLs by mouth 3 (three) times daily as needed for diarrhea or loose stools.   Yes [provider]  carbamide peroxide (DEBROX) 6.5 % OTIC solution Place 5 drops into both ears daily as needed (cerumen impaction). Instill x5 days   Yes [provider]  Cyanocobalamin (VITAMIN  B-12) 500 MCG SUBL Place 500 mcg under the tongue daily.   Yes [provider]  cycloSPORINE (RESTASIS) 0.05 % ophthalmic emulsion Place 1 drop into both eyes 2 (two) times daily.   Yes [provider]  cycloSPORINE (RESTASIS) 0.05 % ophthalmic emulsion Place 1 drop into both eyes 2 (two) times daily as needed (dry eyes).   Yes [provider]  ferrous sulfate 325 (65 FE) MG EC tablet Take 1 tablet (325 mg total) by mouth 2 (two) times daily. 06/22/18 06/22/19 Yes Vaughan Basta, MD  guaifenesin (ROBITUSSIN) 100 MG/5ML syrup Take 200 mg by mouth 4 (four) times daily as needed for cough.   Yes [provider]  lactase (LACTAID) 3000 units tablet Take 6,000 Units by mouth 3 (three) times daily with meals.   Yes [provider]  latanoprost (XALATAN) 0.005 % ophthalmic solution Place 1 drop  into both eyes at bedtime. 03/23/17  Yes [provider]  loperamide (IMODIUM) 2 MG capsule Take 4 mg by mouth 2 (two) times daily as needed for diarrhea or loose stools.    Yes [provider]  magnesium hydroxide (MILK OF MAGNESIA) 400 MG/5ML suspension Take 30 mLs by mouth daily as needed for mild constipation.   Yes [provider]  magnesium oxide (MAG-OX) 400 MG tablet Take 200 mg by mouth daily.    Yes [provider]  Melatonin 3 MG TABS Take 1 tablet by mouth at bedtime.   Yes [provider]  Multiple Vitamin (MULTIVITAMIN) tablet Take 1 tablet by mouth daily.     Yes [provider]  nystatin (NYSTATIN) powder Apply 1 g topically 2 (two) times daily as needed (rash).   Yes [provider]  ondansetron (ZOFRAN) 4 MG tablet Take 4 mg by mouth every 8 (eight) hours as needed for nausea or vomiting.   Yes [provider]  potassium chloride (K-DUR) 10 MEQ tablet Take 1 tablet (10 mEq total) by mouth 2 (two) times daily. 06/22/18  Yes Vaughan Basta, MD  traMADol (ULTRAM) 50 MG tablet Take 1 tablet (50 mg total) by mouth every 8 (eight) hours as needed for moderate pain. 06/12/18  Yes Fritzi Mandes, MD  amLODipine (NORVASC) 5 MG tablet TAKE ONE TABLET BY MOUTH TWICE DAILY Patient not taking: Reported on 07/03/2018 08/27/15   Minna Merritts, MD  dextromethorphan-guaiFENesin (ROBITUSSIN-DM) 10-100 MG/5ML liquid Take 10 mLs by mouth every 4 (four) hours as needed for cough.    [provider]  diazepam (VALIUM) 5 MG tablet Take 2.5 mg by mouth every 12 (twelve) hours as needed for anxiety.     [provider]  dicyclomine (BENTYL) 10 MG capsule Take 10 mg by mouth 4 (four) times daily as needed (diarrhea/ abdominal pain).    [provider]  fish oil-omega-3 fatty acids 1000 MG capsule Take 1 g by mouth 2 (two) times daily.     [provider]  sertraline (ZOLOFT) 25 MG tablet Take 1  tablet (25 mg total) by mouth daily. Patient not taking: Reported on 07/03/2018 05/27/18   Venia Carbon, MD    Family History  Problem Relation Age of Onset  . Stroke Father   . Cancer Neg Hx   . Diabetes Neg Hx      Social History   Tobacco Use  . Smoking status: Former Smoker    Packs/day: 0.50    Types: Cigarettes    Last attempt to quit: 09/09/1981    Years  since quitting: 36.8  . Smokeless tobacco: Never Used  Substance Use Topics  . Alcohol use: Yes    Comment: occassionally  . Drug use: No    Allergies as of 07/03/2018 - Review Complete 07/03/2018  Allergen Reaction Noted  . Lactose Dermatitis   . Maxzide [hydrochlorothiazide w-triamterene] Other (See Comments) 03/23/2012  . Nsaids Other (See Comments)   . Sulfonamide derivatives Other (See Comments)   . Penicillins Rash and Other (See Comments) 03/23/2012    Review of Systems:    All systems reviewed and negative except where noted in HPI.   Physical Exam:  Vital signs in last 24 hours: Temp:  [98 F (36.7 C)-98.7 F (37.1 C)] 98.7 F (37.1 C) (10/05 1241) Pulse Rate:  [77-103] 91 (10/05 1241) Resp:  [16-39] 16 (10/05 1241) BP: (120-157)/(52-72) 157/67 (10/05 1241) SpO2:  [79 %-98 %] 98 % (10/05 1241) Weight:  [49.4 kg] 49.4 kg (10/05 0749) Last BM Date: (patient unaware) General:   Pleasant, bit drowsy Head:  Normocephalic and atraumatic. Eyes:   No icterus.   Conjunctiva pink. PERRLA. Ears:  Normal auditory acuity. Neck:  Supple; no masses or thyroidomegaly Lungs: Respirations even and unlabored. Lungs clear to auscultation bilaterally.   No wheezes, crackles, or rhonchi.  Heart:  Regular rate and rhythm;  Without murmur, clicks, rubs or gallops Abdomen:  Soft, nondistended, nontender. Normal bowel sounds. No appreciable masses or hepatomegaly.  No rebound or guarding.  Neurologic:  Alert and oriented x3;  grossly normal neurologically. Skin:  Intact without significant lesions or  rashes. Cervical Nodes:  No significant cervical adenopathy. Psych:  Alert and cooperative. Normal affect.  LAB RESULTS: Recent Labs    07/03/18 0756 07/03/18 1341  WBC 9.9  --   HGB 6.8* 9.5*  HCT 20.9*  --   PLT 187  --    BMET Recent Labs    07/03/18 0756  NA 138  K 4.0  CL 106  CO2 23  GLUCOSE 109*  BUN 42*  CREATININE 0.74  CALCIUM 8.1*   LFT No results for input(s): PROT, ALBUMIN, AST, ALT, ALKPHOS, BILITOT, BILIDIR, IBILI in the last 72 hours. PT/INR Recent Labs    07/03/18 0923  LABPROT 14.4  INR 1.13    STUDIES: Ct Head Wo Contrast  Result Date: 07/03/2018 CLINICAL DATA:  Minor head trauma from a fall today. EXAM: CT HEAD WITHOUT CONTRAST TECHNIQUE: Contiguous axial images were obtained from the base of the skull through the vertex without intravenous contrast. COMPARISON:  06/08/2018. FINDINGS: Brain: Stable moderately enlarged ventricles and subarachnoid spaces. Stable moderate patchy white matter low density in both cerebral hemispheres. No intracranial hemorrhage, mass lesion or CT evidence of acute infarction. Vascular: No hyperdense vessel or unexpected calcification. Skull: Normal. Negative for fracture or focal lesion. Sinuses/Orbits: Mild right anterior ethmoid sinus mucosal thickening. The included orbits are unremarkable. Other: None. IMPRESSION: 1. No acute abnormality. 2. Stable atrophy and chronic small vessel white matter ischemic changes. Electronically Signed   By: Claudie Revering M.D.   On: 07/03/2018 09:06      Impression / Plan:   Sabrina Hall is a 84 y.o. y/o female who is a patient of [facility].  Last seen at their office in July 2018.  Does not appear that she is on any substantial medications for her Crohn's disease.  Not seen her GI for over a year.  Been admitted today with acute drop in her hemoglobin by about 4 g over the last 2  weeks.  She is not a good historian but based on her history she denies any melena hematemesis.   Denies any abdominal pain.  Not clear if she takes any NSAIDs she does state that she takes some form of aspirin.  I explained to her that to determine if she is bleeding from her GI tract would involve an endoscopy.  She stated that she did not want any endoscopy at this point of time.  Based on her decision I would suggest to monitor her CBC closely and transfuse as needed.  If she were to have overt bleeding then we would have to reevaluate our options.  It would also help if the color of her stools can be documented and  the number of bowel movements.  In the meanwhile advised to continue on PPI.   Thank you for involving me in the care of this patient.      LOS: 0 days   Sabrina Bellows, MD  07/03/2018, 2:13 PM

## 2018-07-03 NOTE — ED Notes (Signed)
Patient transported to CT 

## 2018-07-03 NOTE — Progress Notes (Signed)
UNMATCHED BLOOD PRODUCT NOTE  Compare the patient ID on the blood tag to the patient ID on the hospital armband and Blood Bank armband. Then confirm the unit number on the blood tag matches the unit number on the blood product.  If a discrepancy is discovered return the product to blood bank immediately.   Blood Product Type: Packed Red Blood Cells  Unit #: (Found on blood product bag, begins with W) [id number]  Product Code #: (Found on blood product bag, begins with E) [id number]   Start Time: 0958  Starting Rate: 125 ml/hr  Rate increase/decreased  (if applicable):      ml/hr  Rate changed time (if applicable):    Stop Time:    All Other Documentation should be documented within the Blood Admin Flowsheet per policy.

## 2018-07-03 NOTE — Progress Notes (Signed)
Low bed has been ordered for patient there was no bed available in supply so size wise had to be contacted. Mats placed at bedside and bed alarm active. Patient will be placed in low bed when it is delivered.

## 2018-07-03 NOTE — ED Notes (Signed)
Permission given by patient to speak to her daughter Dorian Pod about her treatment/care - Dorian Pod (pt daughter) is in [place]

## 2018-07-03 NOTE — ED Triage Notes (Signed)
Pt arrived via ems for report of fall this am and then afterwards blood was noted in stool - pt was inpatient a few weeks ago and dx with septic uti and low hgb - pt is alert and oriented x3 at this time - skin warm/dry - color pale - respirations even and unlabored - pt does have fx of left humerus that has not healed

## 2018-07-04 ENCOUNTER — Other Ambulatory Visit: Payer: Self-pay

## 2018-07-04 DIAGNOSIS — K922 Gastrointestinal hemorrhage, unspecified: Principal | ICD-10-CM

## 2018-07-04 LAB — BASIC METABOLIC PANEL
ANION GAP: 3 — AB (ref 5–15)
BUN: 35 mg/dL — AB (ref 8–23)
CALCIUM: 7.6 mg/dL — AB (ref 8.9–10.3)
CO2: 24 mmol/L (ref 22–32)
Chloride: 112 mmol/L — ABNORMAL HIGH (ref 98–111)
Creatinine, Ser: 0.68 mg/dL (ref 0.44–1.00)
GFR calc Af Amer: >60 mL/min (ref >60)
GLUCOSE: 108 mg/dL — AB (ref 70–99)
Potassium: 3.7 mmol/L (ref 3.5–5.1)
Sodium: 139 mmol/L (ref 135–145)

## 2018-07-04 LAB — HEMOGLOBIN AND HEMATOCRIT, BLOOD
HCT: 27.1 % — ABNORMAL LOW (ref 35.0–47.0)
HEMATOCRIT: 28.9 % — AB (ref 35.0–47.0)
Hemoglobin: 9.7 g/dL — ABNORMAL LOW (ref 12.0–16.0)
Hemoglobin: 9.8 g/dL — ABNORMAL LOW (ref 12.0–16.0)

## 2018-07-04 LAB — CBC
HCT: 23.1 % — ABNORMAL LOW (ref 35.0–47.0)
Hemoglobin: 7.8 g/dL — ABNORMAL LOW (ref 12.0–16.0)
MCH: 29.6 pg (ref 26.0–34.0)
MCHC: 33.6 g/dL (ref 32.0–36.0)
MCV: 88.1 fL (ref 80.0–100.0)
PLATELETS: 102 10*3/uL — AB (ref 150–440)
RBC: 2.62 MIL/uL — ABNORMAL LOW (ref 3.80–5.20)
RDW: 19.9 % — AB (ref 11.5–14.5)
WBC: 6.6 10*3/uL (ref 3.6–11.0)

## 2018-07-04 LAB — HEMOGLOBIN
Hemoglobin: 7.9 g/dL — ABNORMAL LOW (ref 12.0–16.0)
Hemoglobin: 8.5 g/dL — ABNORMAL LOW (ref 12.0–16.0)

## 2018-07-04 LAB — PREPARE RBC (CROSSMATCH)

## 2018-07-04 LAB — ABO/RH: ABO/RH(D): O POS

## 2018-07-04 MED ORDER — SODIUM CHLORIDE 0.9% IV SOLUTION
Freq: Once | INTRAVENOUS | Status: AC
  Administered 2018-07-04: 11:00:00 via INTRAVENOUS

## 2018-07-04 MED ORDER — SODIUM CHLORIDE 0.9% IV SOLUTION: Freq: Once | INTRAVENOUS | Status: DC

## 2018-07-04 NOTE — Progress Notes (Addendum)
Per Dr. Posey Pronto hold transfusion ordered unless active bleeding noted again due to stat H&H results. Will continue to monitor patient for signs and symptoms of bleeding.

## 2018-07-04 NOTE — Progress Notes (Signed)
[place] at [facility]                                                                                                                                                                                  Patient Demographics   Sabrina Hall, is a 84 y.o. female, DOB - May 23, 1923, GYF:[id number]  Admit date - 07/03/2018   Admitting Physician Dustin Flock, MD  Outpatient Primary MD for the patient is Venia Carbon, MD   LOS - 1  Subjective: Patient continues to have dark tarry stools,     Review of Systems:   CONSTITUTIONAL: No documented fever. No fatigue, weakness. No weight gain, no weight loss.  EYES: No blurry or double vision.  ENT: No tinnitus. No postnasal drip. No redness of the oropharynx.  RESPIRATORY: No cough, no wheeze, no hemoptysis. No dyspnea.  CARDIOVASCULAR: No chest pain. No orthopnea. No palpitations. No syncope.  GASTROINTESTINAL: No nausea, no vomiting or diarrhea. No abdominal pain. No melena or hematochezia.  GENITOURINARY: No dysuria or hematuria.  ENDOCRINE: No polyuria or nocturia. No heat or cold intolerance.  HEMATOLOGY: No anemia. No bruising. No bleeding.  INTEGUMENTARY: No rashes. No lesions.  MUSCULOSKELETAL: No arthritis. No swelling. No gout.  NEUROLOGIC: No numbness, tingling, or ataxia. No seizure-type activity.  PSYCHIATRIC: No anxiety. No insomnia. No ADD.    Vitals:   Vitals:   07/04/18 0929 07/04/18 1053 07/04/18 1108 07/04/18 1230  BP: (!) 148/65 (!) 146/84 121/67 (!) 143/65  Pulse:  94 93 95  Resp:  17 18 17   Temp:  98.4 F (36.9 C) 98.7 F (37.1 C) 98 F (36.7 C)  TempSrc:  Oral Oral Oral  SpO2:  98% 97% 96%  Weight:      Height:        Wt Readings from Last 3 Encounters:  07/03/18 49.4 kg  06/19/18 52 kg  06/12/18 53.5 kg     Intake/Output Summary (Last 24 hours) at 07/04/2018 1345 Last data filed at 07/04/2018 1000 Gross per 24 hour  Intake 868.76 ml  Output 200 ml  Net 668.76 ml     Physical Exam:   GENERAL: Pleasant-appearing in no apparent distress.  HEAD, EYES, EARS, NOSE AND THROAT: Atraumatic, normocephalic. Extraocular muscles are intact. Pupils equal and reactive to light. Sclerae anicteric. No conjunctival injection. No oro-pharyngeal erythema.  NECK: Supple. There is no jugular venous distention. No bruits, no lymphadenopathy, no thyromegaly.  HEART: Regular rate and rhythm,. No murmurs, no rubs, no clicks.  LUNGS: Clear to auscultation bilaterally. No rales or rhonchi. No wheezes.  ABDOMEN: Soft, flat, nontender, nondistended. Has good bowel sounds. No hepatosplenomegaly appreciated.  EXTREMITIES: No  evidence of any cyanosis, clubbing, or peripheral edema.  +2 pedal and radial pulses bilaterally.  NEUROLOGIC: The patient is alert, awake, and oriented x3 with no focal motor or sensory deficits appreciated bilaterally.  SKIN: Moist and warm with no rashes appreciated.  Psych: Not anxious, depressed LN: No inguinal LN enlargement    Antibiotics   Anti-infectives (From admission, onward)   None      Medications   Scheduled Meds: . acetaminophen  500 mg Oral BID  . amLODipine  5 mg Oral BID  . cycloSPORINE  1 drop Both Eyes BID  . latanoprost  1 drop Both Eyes QHS  . Melatonin  5 mg Oral QHS  . multivitamin with minerals  1 tablet Oral Daily  . omega-3 acid ethyl esters  1 g Oral BID  . [START ON 07/06/2018] pantoprazole  40 mg Intravenous Q12H  . potassium chloride  10 mEq Oral BID  . sertraline  25 mg Oral Daily  . vitamin B-12  500 mcg Oral Daily   Continuous Infusions: . sodium chloride 75 mL/hr at 07/04/18 0456  . pantoprozole (PROTONIX) infusion 8 mg/hr (07/04/18 1038)   PRN Meds:.acetaminophen, cycloSPORINE, dextromethorphan-guaiFENesin, diazepam, ondansetron **OR** ondansetron (ZOFRAN) IV, traMADol   Data Review:   Micro Results No results found for this or any previous visit (from the past 240 hour(s)).  Radiology Reports Dg  Thoracic Spine 2 View  Result Date: 06/09/2018 CLINICAL DATA:  Found on floor in shower, fall, hit back of head and LEFT shoulder, LEFT shoulder pain EXAM: THORACIC SPINE 2 VIEWS COMPARISON:  None Correlation: Abdominal radiographs 10/07/2014 FINDINGS: Osseous demineralization. 13 pairs of ribs. Superior endplate compression fracture of a lower thoracic vertebra, likely T10, with 50% anterior height loss; this is not identified on prior abdominal radiographs. No additional fracture, dislocation, or bone destruction. IMPRESSION: Superior endplate compression fracture of approximately T10 vertebral body with 50% anterior height loss. Electronically Signed   By: Lavonia Dana M.D.   On: 06/09/2018 01:00   Ct Head Wo Contrast  Result Date: 07/03/2018 CLINICAL DATA:  Minor head trauma from a fall today. EXAM: CT HEAD WITHOUT CONTRAST TECHNIQUE: Contiguous axial images were obtained from the base of the skull through the vertex without intravenous contrast. COMPARISON:  06/08/2018. FINDINGS: Brain: Stable moderately enlarged ventricles and subarachnoid spaces. Stable moderate patchy white matter low density in both cerebral hemispheres. No intracranial hemorrhage, mass lesion or CT evidence of acute infarction. Vascular: No hyperdense vessel or unexpected calcification. Skull: Normal. Negative for fracture or focal lesion. Sinuses/Orbits: Mild right anterior ethmoid sinus mucosal thickening. The included orbits are unremarkable. Other: None. IMPRESSION: 1. No acute abnormality. 2. Stable atrophy and chronic small vessel white matter ischemic changes. Electronically Signed   By: Claudie Revering M.D.   On: 07/03/2018 09:06   Ct Head Wo Contrast  Result Date: 06/09/2018 CLINICAL DATA:  Unwitnessed fall in the shower. Struck back of head. EXAM: CT HEAD WITHOUT CONTRAST CT CERVICAL SPINE WITHOUT CONTRAST TECHNIQUE: Multidetector CT imaging of the head and cervical spine was performed following the standard protocol  without intravenous contrast. Multiplanar CT image reconstructions of the cervical spine were also generated. COMPARISON:  Cervical spine CT 12/04/2014, head CT 10/07/2013 FINDINGS: CT HEAD FINDINGS Brain: No intracranial hemorrhage, mass effect, or midline shift. Unchanged degree of atrophy and chronic small vessel ischemia. Unchanged 7 mm slightly hyperattenuating subependymal nodule in the lateral body of the left lateral ventricle. No hydrocephalus. The basilar cisterns are patent. No evidence of territorial  infarct or acute ischemia. No extra-axial or intracranial fluid collection. Vascular: Atherosclerosis of skullbase vasculature without hyperdense vessel or abnormal calcification. Skull: No fracture or focal lesion. Sinuses/Orbits: No acute finding.  Prior right cataract resection. Other: None. CT CERVICAL SPINE FINDINGS Alignment: Unchanged from prior exam with mild anterolisthesis of C4 on C5, C5 on C6, C6 on C7, likely facet mediated. Exaggerated cervical lordosis. No traumatic subluxation. Skull base and vertebrae: No acute fracture. The dens and skull base are intact. Soft tissues and spinal canal: No prevertebral fluid or swelling. No visible canal hematoma. Disc levels: Diffuse disc space narrowing and endplate spurring. Slight progression of degenerative disc disease at C6-C7 since prior exam. Multilevel facet hypertrophy. Upper chest: Negative. Other: Carotid calcifications. IMPRESSION: 1.  No acute intracranial abnormality.  No skull fracture. 2. Multilevel degenerative change throughout the cervical spine without acute fracture. Electronically Signed   By: Keith Rake M.D.   On: 06/09/2018 00:18   Ct Cervical Spine Wo Contrast  Result Date: 06/09/2018 CLINICAL DATA:  Unwitnessed fall in the shower. Struck back of head. EXAM: CT HEAD WITHOUT CONTRAST CT CERVICAL SPINE WITHOUT CONTRAST TECHNIQUE: Multidetector CT imaging of the head and cervical spine was performed following the standard  protocol without intravenous contrast. Multiplanar CT image reconstructions of the cervical spine were also generated. COMPARISON:  Cervical spine CT 12/04/2014, head CT 10/07/2013 FINDINGS: CT HEAD FINDINGS Brain: No intracranial hemorrhage, mass effect, or midline shift. Unchanged degree of atrophy and chronic small vessel ischemia. Unchanged 7 mm slightly hyperattenuating subependymal nodule in the lateral body of the left lateral ventricle. No hydrocephalus. The basilar cisterns are patent. No evidence of territorial infarct or acute ischemia. No extra-axial or intracranial fluid collection. Vascular: Atherosclerosis of skullbase vasculature without hyperdense vessel or abnormal calcification. Skull: No fracture or focal lesion. Sinuses/Orbits: No acute finding.  Prior right cataract resection. Other: None. CT CERVICAL SPINE FINDINGS Alignment: Unchanged from prior exam with mild anterolisthesis of C4 on C5, C5 on C6, C6 on C7, likely facet mediated. Exaggerated cervical lordosis. No traumatic subluxation. Skull base and vertebrae: No acute fracture. The dens and skull base are intact. Soft tissues and spinal canal: No prevertebral fluid or swelling. No visible canal hematoma. Disc levels: Diffuse disc space narrowing and endplate spurring. Slight progression of degenerative disc disease at C6-C7 since prior exam. Multilevel facet hypertrophy. Upper chest: Negative. Other: Carotid calcifications. IMPRESSION: 1.  No acute intracranial abnormality.  No skull fracture. 2. Multilevel degenerative change throughout the cervical spine without acute fracture. Electronically Signed   By: Keith Rake M.D.   On: 06/09/2018 00:18   Dg Shoulder Left  Result Date: 06/09/2018 CLINICAL DATA:  Found on floor in shower, fall, LEFT shoulder pain EXAM: LEFT SHOULDER - 2+ VIEW COMPARISON:  None FINDINGS: Diffuse osseous demineralization. AC joint alignment normal. Displaced fracture at surgical neck LEFT humerus. No  dislocation. No additional fractures identified. IMPRESSION: Displaced fracture at surgical neck of LEFT humerus. Electronically Signed   By: Lavonia Dana M.D.   On: 06/09/2018 00:57     CBC Recent Labs  Lab 07/03/18 0756 07/03/18 1341 07/03/18 1723 07/04/18 0128 07/04/18 0416 07/04/18 0902  WBC 9.9  --   --   --  6.6  --   HGB 6.8* 9.5* 8.9* 7.9* 7.8* 8.5*  HCT 20.9*  --   --   --  23.1*  --   PLT 187  --   --   --  102*  --   MCV  87.3  --   --   --  88.1  --   MCH 28.5  --   --   --  29.6  --   MCHC 32.6  --   --   --  33.6  --   RDW 21.0*  --   --   --  19.9*  --     Chemistries  Recent Labs  Lab 07/03/18 0756 07/04/18 0416  NA 138 139  K 4.0 3.7  CL 106 112*  CO2 23 24  GLUCOSE 109* 108*  BUN 42* 35*  CREATININE 0.74 0.68  CALCIUM 8.1* 7.6*   ------------------------------------------------------------------------------------------------------------------ estimated creatinine clearance is 35 mL/min (by C-G formula based on SCr of 0.68 mg/dL). ------------------------------------------------------------------------------------------------------------------ No results for input(s): HGBA1C in the last 72 hours. ------------------------------------------------------------------------------------------------------------------ No results for input(s): CHOL, HDL, LDLCALC, TRIG, CHOLHDL, LDLDIRECT in the last 72 hours. ------------------------------------------------------------------------------------------------------------------ No results for input(s): TSH, T4TOTAL, T3FREE, THYROIDAB in the last 72 hours.  Invalid input(s): FREET3 ------------------------------------------------------------------------------------------------------------------ No results for input(s): VITAMINB12, FOLATE, FERRITIN, TIBC, IRON, RETICCTPCT in the last 72 hours.  Coagulation profile Recent Labs  Lab 07/03/18 0923  INR 1.13    No results for input(s): DDIMER in the last 72  hours.  Cardiac Enzymes No results for input(s): CKMB, TROPONINI, MYOGLOBIN in the last 168 hours.  Invalid input(s): CK ------------------------------------------------------------------------------------------------------------------ Invalid input(s): [place]   Patient is 84 year old being admitted with dark tarry stools and drop in hemoglobin  1.  Upper GI bleed continue Protonix drip Transfuse one unit of blood Patient had initially refused any EGD now she states that she is willing to get the EGD done after my discussion with her I have notified the GI doctor regarding this  2.Chronic diarrhea I due to GI bleed I will hold her current regimen   3.  Essential hypertension blood pressure currently stable continue home regimen we will hold if needed   4.History of Crohn's disease with GI bleed I will hold her current medication   5.  Glaucoma continue eyedrops  6.  Miscellaneous DVT prophylaxis with SCDs All the records are reviewed and case discussed with ED provider      Code Status Orders  (From admission, onward)         Start     Ordered   07/03/18 1241  Do not attempt resuscitation (DNR)  Continuous    Question Answer Comment  In the event of cardiac or respiratory ARREST Do not call a "code blue"   In the event of cardiac or respiratory ARREST Do not perform Intubation, CPR, defibrillation or ACLS   In the event of cardiac or respiratory ARREST Use medication by any route, position, wound care, and other measures to relive pain and suffering. May use oxygen, suction and manual treatment of airway obstruction as needed for comfort.      07/03/18 1240        Code Status History    Date Active Date Inactive Code Status Order ID Comments User Context   06/19/2018 1710 06/22/2018 1643 DNR [id number]  Dustin Flock, MD Inpatient   06/09/2018 0411 06/12/2018 1715 DNR [id number]  Harrie Foreman, MD Inpatient   06/07/2018 0105  06/07/2018 2054 DNR [id number]  Lance Coon, MD Inpatient   03/25/2017 0305 03/27/2017 1830 DNR [id number]  Saundra Shelling, MD Inpatient   03/25/2017 0205 03/25/2017 0305 Full Code [id number]  Hugelmeyer, Ubaldo Glassing, DO ED    Advance Directive Documentation  Most Recent Value  Type of Advance Directive  Living will  Pre-existing out of facility DNR order (yellow form or pink MOST form)  -  "MOST" Form in Place?  -           Consults  ana  DVT Prophylaxis  scd's  Lab Results  Component Value Date   PLT 102 (L) 07/04/2018     Time Spent in minutes 76mn Greater than 50% of time spent in care coordination and counseling patient regarding the condition and plan of care.   SDustin FlockM.D on 07/04/2018 at 1:45 PM  Between 7am to 6pm - Pager - [phone]  After 6pm go to [email or web address] - [email or web address]   Office  [phone]

## 2018-07-04 NOTE — Progress Notes (Signed)
   07/04/18 2005  Clinical Encounter Type  Visited With Patient;Health care provider  Visit Type Follow-up   Chaplain attempted follow up with patient.  She declined visit but asked chaplain to find staff to assist her with a 'diaper change.'  Chaplain relayed patient request to staff.

## 2018-07-04 NOTE — Progress Notes (Signed)
Dr Estanislado Pandy notified pt refused unit of blood and also EGD, Stated would address EGD in am. Orders given to hold blood.

## 2018-07-04 NOTE — Progress Notes (Signed)
Per Dr. Vicente Males monitor patient to see how she does through the night with bleeding from rectum and then he will determine if RBC scan by nuclear medicine should be done tomorrow as stat order was placed today, but they are not present to perform scan.

## 2018-07-04 NOTE — Progress Notes (Signed)
Sabrina Hall , MD [address], [place], [place], [place], [address] [address], [place], [place], [place], [address] Phone: [phone]  Fax: [phone]   Sabrina Hall is being followed for Melena  Day 1 of follow up   Subjective: Having further melena, no abdominal pain    Objective: Vital signs in last 24 hours: Vitals:   07/04/18 0437 07/04/18 0929 07/04/18 1053 07/04/18 1108  BP: (!) 95/55 (!) 148/65 (!) 146/84 121/67  Pulse: 91  94 93  Resp: 20  17 18   Temp: 98.3 F (36.8 C)  98.4 F (36.9 C) 98.7 F (37.1 C)  TempSrc: Oral  Oral Oral  SpO2: 97%  98% 97%  Weight:      Height:       Weight change:   Intake/Output Summary (Last 24 hours) at 07/04/2018 1129 Last data filed at 07/04/2018 1000 Gross per 24 hour  Intake 1178.76 ml  Output 200 ml  Net 978.76 ml     Exam: Heart:: Regular rate and rhythm, S1S2 present or without murmur or extra heart sounds Lungs: normal, clear to auscultation and clear to auscultation and percussion Abdomen: soft, nontender, normal bowel sounds   Lab Results: @LABTEST2 @ Micro Results: No results found for this or any previous visit (from the past 240 hour(s)). Studies/Results: Ct Head Wo Contrast  Result Date: 07/03/2018 CLINICAL DATA:  Minor head trauma from a fall today. EXAM: CT HEAD WITHOUT CONTRAST TECHNIQUE: Contiguous axial images were obtained from the base of the skull through the vertex without intravenous contrast. COMPARISON:  06/08/2018. FINDINGS: Brain: Stable moderately enlarged ventricles and subarachnoid spaces. Stable moderate patchy white matter low density in both cerebral hemispheres. No intracranial hemorrhage, mass lesion or CT evidence of acute infarction. Vascular: No hyperdense vessel or unexpected calcification. Skull: Normal. Negative for fracture or focal lesion. Sinuses/Orbits: Mild right anterior ethmoid sinus mucosal thickening. The included orbits are unremarkable. Other: None.  IMPRESSION: 1. No acute abnormality. 2. Stable atrophy and chronic small vessel white matter ischemic changes. Electronically Signed   By: Claudie Revering M.D.   On: 07/03/2018 09:06   Medications: I have reviewed the patient's current medications. Scheduled Meds: . acetaminophen  500 mg Oral BID  . amLODipine  5 mg Oral BID  . cycloSPORINE  1 drop Both Eyes BID  . latanoprost  1 drop Both Eyes QHS  . Melatonin  5 mg Oral QHS  . multivitamin with minerals  1 tablet Oral Daily  . omega-3 acid ethyl esters  1 g Oral BID  . [START ON 07/06/2018] pantoprazole  40 mg Intravenous Q12H  . potassium chloride  10 mEq Oral BID  . sertraline  25 mg Oral Daily  . vitamin B-12  500 mcg Oral Daily   Continuous Infusions: . sodium chloride 75 mL/hr at 07/04/18 0456  . pantoprozole (PROTONIX) infusion 8 mg/hr (07/04/18 1038)   PRN Meds:.acetaminophen, cycloSPORINE, dextromethorphan-guaiFENesin, diazepam, ondansetron **OR** ondansetron (ZOFRAN) IV, traMADol CBC Latest Ref Rng & Units 07/04/2018 07/04/2018 07/04/2018  WBC 3.6 - 11.0 K/uL - 6.6 -  Hemoglobin 12.0 - 16.0 g/dL 8.5(L) 7.8(L) 7.9(L)  Hematocrit 35.0 - 47.0 % - 23.1(L) -  Platelets 150 - 440 K/uL - 102(L) -     Assessment: Active Problems:   Upper GI bleed  Sabrina Hall is a 84 y.o. y/o female who is a patient of [facility].  Last seen at their office in July 2018.  Does not appear that she is on any  substantial medications for her Crohn's disease.  Not seen her GI for over a year.  Been admitted today with acute drop in her hemoglobin by about 4 g over the last 2 weeks.  She is not a good historian but based on her history she denies any melena hematemesis.  Denies any abdominal pain.  Not clear if she takes any NSAIDs she does state that she takes some form of aspirin. Had refused EGD on admission , continues to have melena and now is willing to undergo an EGD  Plan  1. EGD tomorrrow  2. NPO from midnight  3. Continue on PPI.  I  have discussed alternative options, risks & benefits,  which include, but are not limited to, bleeding, infection, perforation,respiratory complication & drug reaction.  The patient agrees with this plan & written consent will be obtained.       LOS: 1 day   Sabrina Bellows, MD 07/04/2018, 11:29 AM

## 2018-07-04 NOTE — Progress Notes (Signed)
Patient is refusing transfusion of RBC. Patient educated as to what could happen if she does not receive the transfusion and continued to refuse the transfusion stating " I think I'd rather you not give it to me". Provider notified. Patient is also stating that she is not going to have the EGD procedure performed 07/05/18.

## 2018-07-04 NOTE — Progress Notes (Signed)
   07/04/18 1515  Clinical Encounter Type  Visited With Patient not available;Health care provider  Visit Type Follow-up   Chaplain attempted follow up visit; patient receiving care, staff asked chaplain to return later.

## 2018-07-04 NOTE — Progress Notes (Signed)
   07/04/18 1745  Clinical Encounter Type  Visited With Patient;Health care provider  Visit Type Follow-up   Chaplain attempted follow up visit.  Patient reported needing cleaned up.  Chaplain alerted nurse tech, will attempt follow up again later.

## 2018-07-05 ENCOUNTER — Encounter: Payer: Self-pay | Admitting: *Deleted

## 2018-07-05 ENCOUNTER — Inpatient Hospital Stay: Payer: Medicare Other | Admitting: Anesthesiology

## 2018-07-05 ENCOUNTER — Inpatient Hospital Stay: Payer: Medicare Other

## 2018-07-05 ENCOUNTER — Encounter
Admission: EM | Disposition: A | Discharge status: Hospice | Payer: Self-pay | Source: Home / Self Care | Attending: Internal Medicine

## 2018-07-05 DIAGNOSIS — K921 Melena: Secondary | ICD-10-CM

## 2018-07-05 HISTORY — PX: ESOPHAGOGASTRODUODENOSCOPY (EGD) WITH PROPOFOL: SHX5813

## 2018-07-05 LAB — BASIC METABOLIC PANEL
Anion gap: 5 (ref 5–15)
BUN: 15 mg/dL (ref 8–23)
CHLORIDE: 113 mmol/L — AB (ref 98–111)
CO2: 22 mmol/L (ref 22–32)
Calcium: 7.6 mg/dL — ABNORMAL LOW (ref 8.9–10.3)
Creatinine, Ser: 0.71 mg/dL (ref 0.44–1.00)
GFR calc Af Amer: >60 mL/min (ref >60)
GFR calc non Af Amer: >60 mL/min (ref >60)
GLUCOSE: 89 mg/dL (ref 70–99)
POTASSIUM: 3.2 mmol/L — AB (ref 3.5–5.1)
Sodium: 140 mmol/L (ref 135–145)

## 2018-07-05 LAB — CBC
HCT: 27.1 % — ABNORMAL LOW (ref 35.0–47.0)
HEMOGLOBIN: 9.2 g/dL — AB (ref 12.0–16.0)
MCH: 30.5 pg (ref 26.0–34.0)
MCHC: 33.9 g/dL (ref 32.0–36.0)
MCV: 90 fL (ref 80.0–100.0)
Platelets: 201 10*3/uL (ref 150–440)
RBC: 3.02 MIL/uL — AB (ref 3.80–5.20)
RDW: 19.4 % — ABNORMAL HIGH (ref 11.5–14.5)
WBC: 7.4 10*3/uL (ref 3.6–11.0)

## 2018-07-05 SURGERY — ESOPHAGOGASTRODUODENOSCOPY (EGD) WITH PROPOFOL
Anesthesia: General

## 2018-07-05 MED ORDER — PROPOFOL 10 MG/ML IV BOLUS
INTRAVENOUS | Status: DC | PRN
  Administered 2018-07-05: 20 mg via INTRAVENOUS

## 2018-07-05 MED ORDER — PANTOPRAZOLE SODIUM 40 MG PO TBEC
40.0000 mg | DELAYED_RELEASE_TABLET | Freq: Every day | ORAL | Status: DC
  Administered 2018-07-05 – 2018-07-06 (×2): 40 mg via ORAL
  Filled 2018-07-05 (×2): qty 1

## 2018-07-05 MED ORDER — PROPOFOL 500 MG/50ML IV EMUL
INTRAVENOUS | Status: DC | PRN
  Administered 2018-07-05: 30 ug/kg/min via INTRAVENOUS

## 2018-07-05 MED ORDER — LIDOCAINE HCL (PF) 2 % IJ SOLN
INTRAMUSCULAR | Status: DC | PRN
  Administered 2018-07-05: 100 mg via INTRADERMAL

## 2018-07-05 NOTE — Consult Note (Signed)
ORTHOPAEDIC CONSULTATION  REQUESTING PHYSICIAN: Dustin Flock, MD  Chief Complaint:   Left shoulder pain.  History of Present Illness: Sabrina Hall is a 84 y.o. female who apparently injured her left shoulder 4 weeks ago.  It was elected to treat her nonsurgically at the time.  The patient recently was admitted for an upper GI bleed.  I have been asked to see this patient in follow-up of her shoulder.  She is not in a shoulder immobilizer at the time of this consultation.  The patient denies any reinjury to the shoulder.  She also denies any numbness or paresthesias to her left hand.  Past Medical History:  Diagnosis Date  . Arthritis   . Cataract   . Crohn's   . Deviated septum   . Eczema   . H/O seasonal allergies   . History of hemorrhoids   . History of shingles   . Hypertension   . Psoriasis   . Vitamin B12 deficiency    Past Surgical History:  Procedure Laterality Date  . ABDOMINAL HYSTERECTOMY    . BACK SURGERY    . basel cell carcinoma    . CATARACT EXTRACTION     right eye  . CHOLECYSTECTOMY    . CHOLECYSTECTOMY    . EYE SURGERY    . HYSTERECTOMY    . ILEOCECOSTOMY    . MOHS SURGERY    . NASAL SEPTUM SURGERY    . NECK SURGERY  2004   mass removed from cervical spine  . NOSE SURGERY     basal cell carcinoma of nose   Social History   Socioeconomic History  . Marital status: Widowed    Spouse name: Not on file  . Number of children: 2  . Years of education: Not on file  . Highest education level: Not on file  Occupational History  . 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 Financial resource strain: Not hard at all  . Food insecurity:    Worry: Never true    Inability: Never true  . Transportation needs:    Medical: No    Non-medical: No  Tobacco Use  . Smoking status: Former Smoker    Packs/day: 0.50    Types: Cigarettes    Last attempt to quit:  09/09/1981    Years since quitting: 36.8  . Smokeless tobacco: Never Used  Substance and Sexual Activity  . Alcohol use: Yes    Comment: occassionally  . Drug use: No  . Sexual activity: Not on file  Lifestyle  . Physical activity:    Days per week: 3 days    Minutes per session: 30 min  . Stress: Not at all  Relationships  . Social connections:    Talks on phone: More than three times a week    Gets together: More than three times a week    Attends religious service: More than 4 times per year    Active member of club or organization: No    Attends meetings of clubs or organizations: Never    Relationship status: Widowed  Other Topics Concern  . Not on file  Social History Narrative   Has living will    Daughter is health care POA   Has DNR order--this is confirmed at visit 12/07/15   No tube feeds if cognitively unaware   Family History  Problem Relation Age of Onset  . Stroke Father   . Cancer Neg Hx   . Diabetes  Neg Hx    Allergies  Allergen Reactions  . Lactose Dermatitis  . Maxzide [Hydrochlorothiazide W-Triamterene] Other (See Comments)    causes electrolyte disturbance  . Nsaids Other (See Comments)    Reaction: unknown  . Sulfonamide Derivatives Other (See Comments)    Reaction: unknown  . Penicillins Rash and Other (See Comments)    Reaction pulled from Care Everywhere Has patient had a PCN reaction causing immediate rash, facial/tongue/throat swelling, SOB or lightheadedness with hypotension: Unknown Has patient had a PCN reaction causing severe rash involving mucus membranes or skin necrosis: Unknown Has patient had a PCN reaction that required hospitalization: Unknown Has patient had a PCN reaction occurring within the last 10 years: Unknown If all of the above answers are "NO", then may proceed with Cephalosporin use.    Prior to Admission medications   Medication Sig Start Date End Date Taking? Authorizing Provider  acetaminophen (TYLENOL) 325 MG  tablet Take 325-650 mg by mouth every 4 (four) hours as needed for fever (general discomfort).   Yes [provider]  acetaminophen (TYLENOL) 500 MG tablet Take 500 mg by mouth 2 (two) times daily.   Yes [provider]  alum & mag hydroxide-simeth (MAALOX/MYLANTA) 200-200-20 MG/5ML suspension Take 30 mLs by mouth every 4 (four) hours as needed for indigestion or flatulence (upset stomach).   Yes [provider]  aspirin 81 MG chewable tablet Chew 81 mg by mouth daily.    Yes [provider]  balsalazide (COLAZAL) 750 MG capsule Take 1,500 mg by mouth 2 (two) times daily.   Yes [provider]  bismuth subsalicylate (PEPTO BISMOL) 262 MG/15ML suspension Take 10 mLs by mouth 3 (three) times daily as needed for diarrhea or loose stools.   Yes [provider]  carbamide peroxide (DEBROX) 6.5 % OTIC solution Place 5 drops into both ears daily as needed (cerumen impaction). Instill x5 days   Yes [provider]  Cyanocobalamin (VITAMIN B-12) 500 MCG SUBL Place 500 mcg under the tongue daily.   Yes [provider]  cycloSPORINE (RESTASIS) 0.05 % ophthalmic emulsion Place 1 drop into both eyes 2 (two) times daily.   Yes [provider]  cycloSPORINE (RESTASIS) 0.05 % ophthalmic emulsion Place 1 drop into both eyes 2 (two) times daily as needed (dry eyes).   Yes [provider]  ferrous sulfate 325 (65 FE) MG EC tablet Take 1 tablet (325 mg total) by mouth 2 (two) times daily. 06/22/18 06/22/19 Yes Vaughan Basta, MD  guaifenesin (ROBITUSSIN) 100 MG/5ML syrup Take 200 mg by mouth 4 (four) times daily as needed for cough.   Yes [provider]  lactase (LACTAID) 3000 units tablet Take 6,000 Units by mouth 3 (three) times daily with meals.   Yes [provider]  latanoprost (XALATAN) 0.005 % ophthalmic solution Place 1 drop into both eyes at bedtime. 03/23/17  Yes [provider]  loperamide  (IMODIUM) 2 MG capsule Take 4 mg by mouth 2 (two) times daily as needed for diarrhea or loose stools.    Yes [provider]  magnesium hydroxide (MILK OF MAGNESIA) 400 MG/5ML suspension Take 30 mLs by mouth daily as needed for mild constipation.   Yes [provider]  magnesium oxide (MAG-OX) 400 MG tablet Take 200 mg by mouth daily.    Yes [provider]  Melatonin 3 MG TABS Take 1 tablet by mouth at bedtime.   Yes [provider]  Multiple Vitamin (MULTIVITAMIN) tablet Take 1  tablet by mouth daily.     Yes [provider]  nystatin (NYSTATIN) powder Apply 1 g topically 2 (two) times daily as needed (rash).   Yes [provider]  ondansetron (ZOFRAN) 4 MG tablet Take 4 mg by mouth every 8 (eight) hours as needed for nausea or vomiting.   Yes [provider]  potassium chloride (K-DUR) 10 MEQ tablet Take 1 tablet (10 mEq total) by mouth 2 (two) times daily. 06/22/18  Yes Vaughan Basta, MD  traMADol (ULTRAM) 50 MG tablet Take 1 tablet (50 mg total) by mouth every 8 (eight) hours as needed for moderate pain. 06/12/18  Yes Fritzi Mandes, MD  amLODipine (NORVASC) 5 MG tablet TAKE ONE TABLET BY MOUTH TWICE DAILY Patient not taking: Reported on 07/03/2018 08/27/15   Minna Merritts, MD  dextromethorphan-guaiFENesin (ROBITUSSIN-DM) 10-100 MG/5ML liquid Take 10 mLs by mouth every 4 (four) hours as needed for cough.    [provider]  diazepam (VALIUM) 5 MG tablet Take 2.5 mg by mouth every 12 (twelve) hours as needed for anxiety.     [provider]  dicyclomine (BENTYL) 10 MG capsule Take 10 mg by mouth 4 (four) times daily as needed (diarrhea/ abdominal pain).    [provider]  fish oil-omega-3 fatty acids 1000 MG capsule Take 1 g by mouth 2 (two) times daily.     [provider]  sertraline (ZOLOFT) 25 MG tablet Take 1 tablet (25 mg total) by mouth daily. Patient not taking: Reported on 07/03/2018  05/27/18   Venia Carbon, MD   No results found.  Positive ROS: All other systems have been reviewed and were otherwise negative with the exception of those mentioned in the HPI and as above.  Physical Exam: General:  Alert, no acute distress, but somewhat confused Psychiatric:  Patient is not competent for consent   Cardiovascular:  No pedal edema Respiratory:  No wheezing, non-labored breathing GI:  Abdomen is soft and non-tender Skin:  No lesions in the area of chief complaint Neurologic:  Sensation intact distally Lymphatic:  No axillary or cervical lymphadenopathy  Orthopedic Exam:  Orthopedic examination is limited to the left shoulder and upper extremity.  There is mild swelling around the shoulder, but no other skin abnormalities are identified.  Specifically, there is no evidence for ecchymosis, abrasions, lacerations, or erythema.  She has mild tenderness to palpation over the proximal shoulder region.  She has more moderate pain with gentle limited range of motion of the shoulder.  She is able to actively flex and extend her elbow.  She is able to actively flex and extend her wrist and able to flex and extend all digits.  She is neurovascularly intact to the left upper extremity and hand.  X-rays:  X-rays of the left shoulder from 4 weeks ago are available for review.  These films demonstrate a 2 part displaced surgical neck fracture which apparently has been treated nonsurgically.  No more recent x-rays are available for review.  Assessment: Status post 2 part surgical neck fracture of left proximal humerus.  Plan: Given the patient's age and level of confusion, I do not think that she would be a good surgical candidate for any type of surgical intervention.  Therefore, she should be treated with continued nonsurgical treatment to include use of a shoulder immobilizer as necessary for comfort.  I will order additional x-rays of her shoulder to be done while she is in the  hospital here.  Thank you  for asking me to participate in the care of this most unfortunate woman.  I will be happy to follow her with you.   Pascal Lux, MD  Beeper #:  [phone]  07/05/2018 5:04 PM

## 2018-07-05 NOTE — Consult Note (Signed)
I have been asked by Dr. Posey Pronto to grant orthopedic clearance for this patient undergo an endoscopic procedure.  This procedure would require her to be lain on her left side.  She is 4 weeks status post a surgical neck fracture of the left proximal humerus.  Review of x-rays from 06/09/2018 confirmed the presence of a displaced surgical neck fracture.  Given the necessity of the endoscopic procedure, I feel that it is okay from an orthopedic standpoint for her to proceed with this procedure.  The patient should be moved carefully and laid gently on the left side with an axillary roll to avoid undue pressure on the shoulder.  After the procedure, the patient should be placed back into a shoulder immobilizer if she is not in one already.  I will come by and see the patient later today and conduct a more formal consultation.  Thank you.

## 2018-07-05 NOTE — Transfer of Care (Signed)
Immediate Anesthesia Transfer of Care Note  Patient: Sabrina Hall  Procedure(s) Performed: ESOPHAGOGASTRODUODENOSCOPY (EGD) WITH PROPOFOL (N/A )  Patient Location: PACU  Anesthesia Type:General  Level of Consciousness: awake and alert   Airway & Oxygen Therapy: Patient Spontanous Breathing and Patient connected to nasal cannula oxygen  Post-op Assessment: Report given to RN and Post -op Vital signs reviewed and stable  Post vital signs: Reviewed and stable  Last Vitals:  Vitals Value Taken Time  BP    Temp    Pulse    Resp    SpO2      Last Pain:  Vitals:   07/05/18 1257  TempSrc: Tympanic  PainSc: 0-No pain      Patients Stated Pain Goal: 0 (09/79/49 9718)  Complications: No apparent anesthesia complications

## 2018-07-05 NOTE — Progress Notes (Signed)
[place] [facility] [place]                                                                                                                                                                                  Patient Demographics   Sabrina Hall, is a 84 y.o. female, DOB - [birth year], FGH:[id number]  Admit date - 07/03/2018   Admitting Physician Dustin Flock, MD  Outpatient Primary MD for the patient is Venia Carbon, MD   LOS - 2  Subjective: No bleeding since this morning patient awaiting EGD  Review of Systems:   CONSTITUTIONAL: No documented fever. No fatigue, + weakness. No weight gain, no weight loss.  EYES: No blurry or double vision.  ENT: No tinnitus. No postnasal drip. No redness of the oropharynx.  RESPIRATORY: No cough, no wheeze, no hemoptysis. No dyspnea.  CARDIOVASCULAR: No chest pain. No orthopnea. No palpitations. No syncope.  GASTROINTESTINAL: No nausea, no vomiting or diarrhea. No abdominal pain. + melena GENITOURINARY: No dysuria or hematuria.  ENDOCRINE: No polyuria or nocturia. No heat or cold intolerance.  HEMATOLOGY: No anemia. No bruising. No bleeding.  INTEGUMENTARY: No rashes. No lesions.  MUSCULOSKELETAL: No arthritis. No swelling. No gout.  NEUROLOGIC: No numbness, tingling, or ataxia. No seizure-type activity.  PSYCHIATRIC: No anxiety. No insomnia. No ADD.    Vitals:   Vitals:   07/04/18 2141 07/05/18 0526 07/05/18 1257 07/05/18 1342  BP: 129/76 113/82 (!) 162/72 (!) 136/54  Pulse: 94 95 91 83  Resp: 20 16 18 18   Temp: 98.3 F (36.8 C) 97.9 F (36.6 C) 97.8 F (36.6 C) (!) 97 F (36.1 C)  TempSrc: Oral Oral Tympanic Tympanic  SpO2: 97% 95% 98% 95%  Weight:      Height:        Wt Readings from Last 3 Encounters:  07/03/18 49.4 kg  06/19/18 52 kg  06/12/18 53.5 kg     Intake/Output Summary (Last 24 hours) at 07/05/2018 1348 Last data filed at 07/05/2018 1329 Gross per 24 hour  Intake 2163.45 ml  Output  -  Net 2163.45 ml    Physical Exam:   GENERAL: Pleasant-appearing  appelars pale HEAD, EYES, EARS, NOSE AND THROAT: Atraumatic, normocephalic. Extraocular muscles are intact. Pupils equal and reactive to light. Sclerae anicteric. No conjunctival injection. No oro-pharyngeal erythema.  NECK: Supple. There is no jugular venous distention. No bruits, no lymphadenopathy, no thyromegaly.  HEART: Regular rate and rhythm,. No murmurs, no rubs, no clicks.  LUNGS: Clear to auscultation bilaterally. No rales or rhonchi. No wheezes.  ABDOMEN: Soft, flat, nontender, nondistended. Has good bowel sounds. No hepatosplenomegaly appreciated.  EXTREMITIES: No evidence of any cyanosis,  clubbing, or peripheral edema.  +2 pedal and radial pulses bilaterally.  NEUROLOGIC: The patient is alert, awake, and oriented x3 with no focal motor or sensory deficits appreciated bilaterally.  SKIN: Moist and warm with no rashes appreciated.  Psych: Not anxious, depressed LN: No inguinal LN enlargement    Antibiotics   Anti-infectives (From admission, onward)   None      Medications   Scheduled Meds: . [MAR Hold] sodium chloride   Intravenous Once  . [MAR Hold] acetaminophen  500 mg Oral BID  . [MAR Hold] amLODipine  5 mg Oral BID  . [MAR Hold] cycloSPORINE  1 drop Both Eyes BID  . [MAR Hold] latanoprost  1 drop Both Eyes QHS  . [MAR Hold] Melatonin  5 mg Oral QHS  . [MAR Hold] multivitamin with minerals  1 tablet Oral Daily  . [MAR Hold] omega-3 acid ethyl esters  1 g Oral BID  . [MAR Hold] pantoprazole  40 mg Intravenous Q12H  . [MAR Hold] potassium chloride  10 mEq Oral BID  . [MAR Hold] sertraline  25 mg Oral Daily  . [MAR Hold] vitamin B-12  500 mcg Oral Daily   Continuous Infusions: . sodium chloride 75 mL/hr at 07/05/18 0500  . pantoprozole (PROTONIX) infusion 8 mg/hr (07/05/18 0813)   PRN Meds:.[MAR Hold] acetaminophen, [MAR Hold] cycloSPORINE, [MAR Hold] dextromethorphan-guaiFENesin, [MAR Hold]  diazepam, [MAR Hold] ondansetron **OR** [MAR Hold] ondansetron (ZOFRAN) IV, [MAR Hold] traMADol   Data Review:   Micro Results No results found for this or any previous visit (from the past 240 hour(s)).  Radiology Reports Dg Thoracic Spine 2 View  Result Date: 06/09/2018 CLINICAL DATA:  Found on floor in shower, fall, hit back of head and LEFT shoulder, LEFT shoulder pain EXAM: THORACIC SPINE 2 VIEWS COMPARISON:  None Correlation: Abdominal radiographs 10/07/2014 FINDINGS: Osseous demineralization. 13 pairs of ribs. Superior endplate compression fracture of a lower thoracic vertebra, likely T10, with 50% anterior height loss; this is not identified on prior abdominal radiographs. No additional fracture, dislocation, or bone destruction. IMPRESSION: Superior endplate compression fracture of approximately T10 vertebral body with 50% anterior height loss. Electronically Signed   By: Lavonia Dana M.D.   On: 06/09/2018 01:00   Ct Head Wo Contrast  Result Date: 07/03/2018 CLINICAL DATA:  Minor head trauma from a fall today. EXAM: CT HEAD WITHOUT CONTRAST TECHNIQUE: Contiguous axial images were obtained from the base of the skull through the vertex without intravenous contrast. COMPARISON:  06/08/2018. FINDINGS: Brain: Stable moderately enlarged ventricles and subarachnoid spaces. Stable moderate patchy white matter low density in both cerebral hemispheres. No intracranial hemorrhage, mass lesion or CT evidence of acute infarction. Vascular: No hyperdense vessel or unexpected calcification. Skull: Normal. Negative for fracture or focal lesion. Sinuses/Orbits: Mild right anterior ethmoid sinus mucosal thickening. The included orbits are unremarkable. Other: None. IMPRESSION: 1. No acute abnormality. 2. Stable atrophy and chronic small vessel white matter ischemic changes. Electronically Signed   By: Claudie Revering M.D.   On: 07/03/2018 09:06   Ct Head Wo Contrast  Result Date: 06/09/2018 CLINICAL DATA:   Unwitnessed fall in the shower. Struck back of head. EXAM: CT HEAD WITHOUT CONTRAST CT CERVICAL SPINE WITHOUT CONTRAST TECHNIQUE: Multidetector CT imaging of the head and cervical spine was performed following the standard protocol without intravenous contrast. Multiplanar CT image reconstructions of the cervical spine were also generated. COMPARISON:  Cervical spine CT 12/04/2014, head CT 10/07/2013 FINDINGS: CT HEAD FINDINGS Brain: No intracranial hemorrhage, mass effect,  or midline shift. Unchanged degree of atrophy and chronic small vessel ischemia. Unchanged 7 mm slightly hyperattenuating subependymal nodule in the lateral body of the left lateral ventricle. No hydrocephalus. The basilar cisterns are patent. No evidence of territorial infarct or acute ischemia. No extra-axial or intracranial fluid collection. Vascular: Atherosclerosis of skullbase vasculature without hyperdense vessel or abnormal calcification. Skull: No fracture or focal lesion. Sinuses/Orbits: No acute finding.  Prior right cataract resection. Other: None. CT CERVICAL SPINE FINDINGS Alignment: Unchanged from prior exam with mild anterolisthesis of C4 on C5, C5 on C6, C6 on C7, likely facet mediated. Exaggerated cervical lordosis. No traumatic subluxation. Skull base and vertebrae: No acute fracture. The dens and skull base are intact. Soft tissues and spinal canal: No prevertebral fluid or swelling. No visible canal hematoma. Disc levels: Diffuse disc space narrowing and endplate spurring. Slight progression of degenerative disc disease at C6-C7 since prior exam. Multilevel facet hypertrophy. Upper chest: Negative. Other: Carotid calcifications. IMPRESSION: 1.  No acute intracranial abnormality.  No skull fracture. 2. Multilevel degenerative change throughout the cervical spine without acute fracture. Electronically Signed   By: Keith Rake M.D.   On: 06/09/2018 00:18   Ct Cervical Spine Wo Contrast  Result Date: 06/09/2018 CLINICAL  DATA:  Unwitnessed fall in the shower. Struck back of head. EXAM: CT HEAD WITHOUT CONTRAST CT CERVICAL SPINE WITHOUT CONTRAST TECHNIQUE: Multidetector CT imaging of the head and cervical spine was performed following the standard protocol without intravenous contrast. Multiplanar CT image reconstructions of the cervical spine were also generated. COMPARISON:  Cervical spine CT 12/04/2014, head CT 10/07/2013 FINDINGS: CT HEAD FINDINGS Brain: No intracranial hemorrhage, mass effect, or midline shift. Unchanged degree of atrophy and chronic small vessel ischemia. Unchanged 7 mm slightly hyperattenuating subependymal nodule in the lateral body of the left lateral ventricle. No hydrocephalus. The basilar cisterns are patent. No evidence of territorial infarct or acute ischemia. No extra-axial or intracranial fluid collection. Vascular: Atherosclerosis of skullbase vasculature without hyperdense vessel or abnormal calcification. Skull: No fracture or focal lesion. Sinuses/Orbits: No acute finding.  Prior right cataract resection. Other: None. CT CERVICAL SPINE FINDINGS Alignment: Unchanged from prior exam with mild anterolisthesis of C4 on C5, C5 on C6, C6 on C7, likely facet mediated. Exaggerated cervical lordosis. No traumatic subluxation. Skull base and vertebrae: No acute fracture. The dens and skull base are intact. Soft tissues and spinal canal: No prevertebral fluid or swelling. No visible canal hematoma. Disc levels: Diffuse disc space narrowing and endplate spurring. Slight progression of degenerative disc disease at C6-C7 since prior exam. Multilevel facet hypertrophy. Upper chest: Negative. Other: Carotid calcifications. IMPRESSION: 1.  No acute intracranial abnormality.  No skull fracture. 2. Multilevel degenerative change throughout the cervical spine without acute fracture. Electronically Signed   By: Keith Rake M.D.   On: 06/09/2018 00:18   Dg Shoulder Left  Result Date: 06/09/2018 CLINICAL DATA:   Found on floor in shower, fall, LEFT shoulder pain EXAM: LEFT SHOULDER - 2+ VIEW COMPARISON:  None FINDINGS: Diffuse osseous demineralization. AC joint alignment normal. Displaced fracture at surgical neck LEFT humerus. No dislocation. No additional fractures identified. IMPRESSION: Displaced fracture at surgical neck of LEFT humerus. Electronically Signed   By: Lavonia Dana M.D.   On: 06/09/2018 00:57     CBC Recent Labs  Lab 07/03/18 0756  07/04/18 0416 07/04/18 0902 07/04/18 1520 07/04/18 2254 07/05/18 0426  WBC 9.9  --  6.6  --   --   --  7.4  HGB 6.8*   < > 7.8* 8.5* 9.7* 9.8* 9.2*  HCT 20.9*  --  23.1*  --  27.1* 28.9* 27.1*  PLT 187  --  102*  --   --   --  201  MCV 87.3  --  88.1  --   --   --  90.0  MCH 28.5  --  29.6  --   --   --  30.5  MCHC 32.6  --  33.6  --   --   --  33.9  RDW 21.0*  --  19.9*  --   --   --  19.4*   < > = values in this interval not displayed.    Chemistries  Recent Labs  Lab 07/03/18 0756 07/04/18 0416 07/05/18 0426  NA 138 139 140  K 4.0 3.7 3.2*  CL 106 112* 113*  CO2 23 24 22   GLUCOSE 109* 108* 89  BUN 42* 35* 15  CREATININE 0.74 0.68 0.71  CALCIUM 8.1* 7.6* 7.6*   ------------------------------------------------------------------------------------------------------------------ estimated creatinine clearance is 35 mL/min (by C-G formula based on SCr of 0.71 mg/dL). ------------------------------------------------------------------------------------------------------------------ No results for input(s): HGBA1C in the last 72 hours. ------------------------------------------------------------------------------------------------------------------ No results for input(s): CHOL, HDL, LDLCALC, TRIG, CHOLHDL, LDLDIRECT in the last 72 hours. ------------------------------------------------------------------------------------------------------------------ No results for input(s): TSH, T4TOTAL, T3FREE, THYROIDAB in the last 72 hours.  Invalid  input(s): FREET3 ------------------------------------------------------------------------------------------------------------------ No results for input(s): VITAMINB12, FOLATE, FERRITIN, TIBC, IRON, RETICCTPCT in the last 72 hours.  Coagulation profile Recent Labs  Lab 07/03/18 0923  INR 1.13    No results for input(s): DDIMER in the last 72 hours.  Cardiac Enzymes No results for input(s): CKMB, TROPONINI, MYOGLOBIN in the last 168 hours.  Invalid input(s): CK ------------------------------------------------------------------------------------------------------------------ Invalid input(s): [place]   Patient is 84 year old being admitted with dark tarry stools and drop in hemoglobin  1.  Upper GI bleed  Status post transfusion Hemoglobin is stable Await EGD Discussed with patient's daughter and granddaughter if EGD does not show anything they may consider patient making patient comfort care  2.Chronic diarrhea I due to GI bleed I will hold her current regimen   3.  Essential hypertension blood pressure currently stable continue home regimen we will hold if needed   4.History of Crohn's disease with GI bleed I will hold her current medication   5.  Glaucoma continue eyedrops  6.  Miscellaneous DVT prophylaxis with SCDs All the records are reviewed and case discussed with ED provider      Code Status Orders  (From admission, onward)         Start     Ordered   07/03/18 1241  Do not attempt resuscitation (DNR)  Continuous    Question Answer Comment  In the event of cardiac or respiratory ARREST Do not call a "code blue"   In the event of cardiac or respiratory ARREST Do not perform Intubation, CPR, defibrillation or ACLS   In the event of cardiac or respiratory ARREST Use medication by any route, position, wound care, and other measures to relive pain and suffering. May use oxygen, suction and manual treatment of airway  obstruction as needed for comfort.      07/03/18 1240        Code Status History    Date Active Date Inactive Code Status Order ID Comments User Context   06/19/2018 1710 06/22/2018 1643 DNR [id number]  Dustin Flock, MD Inpatient   06/09/2018 0411 06/12/2018 1715  DNR [id number]  Harrie Foreman, MD Inpatient   06/07/2018 0105 06/07/2018 2054 DNR [id number]  Lance Coon, MD Inpatient   03/25/2017 0305 03/27/2017 1830 DNR [id number]  Saundra Shelling, MD Inpatient   03/25/2017 0205 03/25/2017 0305 Full Code [id number]  Hugelmeyer, Ubaldo Glassing, DO ED    Advance Directive Documentation     Most Recent Value  Type of Advance Directive  Living will  Pre-existing out of facility DNR order (yellow form or pink MOST form)  -  "MOST" Form in Place?  -           Consults  ana  DVT Prophylaxis  scd's  Lab Results  Component Value Date   PLT 201 07/05/2018     Time Spent in minutes 6mn     SDustin FlockM.D on 07/05/2018 at 1:48 PM  Between 7am to 6pm - Pager - [phone]  After 6pm go to [email or web address] - pProofreader [employer]   Office  [phone]

## 2018-07-05 NOTE — Op Note (Signed)
[facility] Gastroenterology Patient Name: Sabrina Hall Procedure Date: 07/05/2018 12:58 PM MRN: [id number] Account #: [id number] Date of Birth: Jan 04, 1923 Admit Type: Inpatient Age: 84 Room: [facility] ENDO ROOM 2 Gender: Female Note Status: Finalized Procedure:            Upper GI endoscopy Indications:          Melena Providers:            Nazifa Trinka B. Bonna Gains MD, MD Referring MD:         Venia Carbon (Referring MD) Medicines:            Monitored Anesthesia Care Complications:        No immediate complications. Procedure:            Pre-Anesthesia Assessment:                       - Prior to the procedure, a History and Physical was                        performed, and patient medications, allergies and                        sensitivities were reviewed. The patient's tolerance of                        previous anesthesia was reviewed.                       - The risks and benefits of the procedure and the                        sedation options and risks were discussed with the                        patient. All questions were answered and informed                        consent was obtained.                       - Patient identification and proposed procedure were                        verified prior to the procedure by the physician, the                        nurse, the anesthesiologist, the anesthetist and the                        technician. The procedure was verified in the procedure                        room.                       - ASA Grade Assessment: III - A patient with severe                        systemic disease.                       After obtaining  informed consent, the endoscope was                        passed under direct vision. Throughout the procedure,                        the patient's blood pressure, pulse, and oxygen                        saturations were monitored continuously. The Endoscope    was introduced through the mouth, and advanced to the                        third part of duodenum. The upper GI endoscopy was                        accomplished with ease. The patient tolerated the                        procedure well. Findings:      The examined esophagus was normal.      The entire examined stomach was normal.      The duodenal bulb, second portion of the duodenum and examined duodenum       were normal. Clear green bile fluid seen in the second portion of the       duodenum. Impression:           - Normal esophagus.                       - Normal stomach.                       - Normal duodenal bulb, second portion of the duodenum                        and examined duodenum.                       - No specimens collected.                       - No evidence of active or recent bleeding seen                        throughout the exam. Recommendation:       - Continue Serial CBCs and transfuse PRN                       - Colonoscopy was discussed with pt and family to                        evaluate for possible source of anemia and GI bleed.                        They are refusing the procedure and risks of underlying                        colon malignancy was discussed in detail and they                        verbalized  understanding but would not like the                        procedure at this time.                       - Check for iron deficiency and replace with IV iron if                        low                       - Continue present medications.                       - Patient has a contact number available for                        emergencies. The signs and symptoms of potential                        delayed complications were discussed with the patient.                        Return to normal activities tomorrow. Written discharge                        instructions were provided to the patient.                       - The findings and  recommendations were discussed with                        the patient.                       - The findings and recommendations were discussed with                        the patient's family.                       - Please page GI with any signs of active GI bleeding Procedure Code(s):    --- Professional ---                       [phone], Esophagogastroduodenoscopy, flexible, transoral;                        diagnostic, including collection of specimen(s) by                        brushing or washing, when performed (separate procedure) Diagnosis Code(s):    --- Professional ---                       K92.1, Melena (includes Hematochezia) CPT copyright 2017 [employer]. All rights reserved. The codes documented in this report are preliminary and upon coder review may  be revised to meet current compliance requirements.  Vonda Antigua, MD Margretta Sidle B. Bonna Gains MD, MD 07/05/2018 1:53:35 PM This report has been signed electronically. Number of Addenda: 0 Note Initiated On: 07/05/2018 12:58 PM Estimated Blood Loss: Estimated blood  loss: none.      [facility]

## 2018-07-05 NOTE — Progress Notes (Signed)
[facility]                                                                                                                                                                                  Patient Demographics   Sabrina Hall, is a 84 y.o. female, DOB - 1922/10/17, OVZ:[id number]  Admit date - 07/03/2018   Admitting Physician Dustin Flock, MD  Outpatient Primary MD for the patient is Venia Carbon, MD   LOS - 2  Subjective: Called by nurse pt having large amount of tarry stools,  Pt feels weak     Review of Systems:   CONSTITUTIONAL: No documented fever. No fatigue, + weakness. No weight gain, no weight loss.  EYES: No blurry or double vision.  ENT: No tinnitus. No postnasal drip. No redness of the oropharynx.  RESPIRATORY: No cough, no wheeze, no hemoptysis. No dyspnea.  CARDIOVASCULAR: No chest pain. No orthopnea. No palpitations. No syncope.  GASTROINTESTINAL: No nausea, no vomiting or diarrhea. No abdominal pain. + melena GENITOURINARY: No dysuria or hematuria.  ENDOCRINE: No polyuria or nocturia. No heat or cold intolerance.  HEMATOLOGY: No anemia. No bruising. No bleeding.  INTEGUMENTARY: No rashes. No lesions.  MUSCULOSKELETAL: No arthritis. No swelling. No gout.  NEUROLOGIC: No numbness, tingling, or ataxia. No seizure-type activity.  PSYCHIATRIC: No anxiety. No insomnia. No ADD.    Vitals:   Vitals:   07/04/18 1844 07/04/18 1909 07/04/18 2141 07/05/18 0526  BP: 129/77 (!) 89/69 129/76 113/82  Pulse: 95 94 94 95  Resp: 16 17 20 16   Temp: 98.5 F (36.9 C)  98.3 F (36.8 C) 97.9 F (36.6 C)  TempSrc: Oral  Oral Oral  SpO2: 96% 96% 97% 95%  Weight:      Height:        Wt Readings from Last 3 Encounters:  07/03/18 49.4 kg  06/19/18 52 kg  06/12/18 53.5 kg     Intake/Output Summary (Last 24 hours) at 07/05/2018 0941 Last data filed at 07/05/2018 0500 Gross per 24 hour  Intake 2063.45 ml  Output 0 ml  Net  2063.45 ml    Physical Exam:   GENERAL: Pleasant-appearing  appelars pale HEAD, EYES, EARS, NOSE AND THROAT: Atraumatic, normocephalic. Extraocular muscles are intact. Pupils equal and reactive to light. Sclerae anicteric. No conjunctival injection. No oro-pharyngeal erythema.  NECK: Supple. There is no jugular venous distention. No bruits, no lymphadenopathy, no thyromegaly.  HEART: Regular rate and rhythm,. No murmurs, no rubs, no clicks.  LUNGS: Clear to auscultation bilaterally. No rales or rhonchi. No wheezes.  ABDOMEN: Soft, flat, nontender, nondistended. Has good bowel sounds. No hepatosplenomegaly appreciated.  EXTREMITIES:  No evidence of any cyanosis, clubbing, or peripheral edema.  +2 pedal and radial pulses bilaterally.  NEUROLOGIC: The patient is alert, awake, and oriented x3 with no focal motor or sensory deficits appreciated bilaterally.  SKIN: Moist and warm with no rashes appreciated.  Psych: Not anxious, depressed LN: No inguinal LN enlargement    Antibiotics   Anti-infectives (From admission, onward)   None      Medications   Scheduled Meds: . sodium chloride   Intravenous Once  . acetaminophen  500 mg Oral BID  . amLODipine  5 mg Oral BID  . cycloSPORINE  1 drop Both Eyes BID  . latanoprost  1 drop Both Eyes QHS  . Melatonin  5 mg Oral QHS  . multivitamin with minerals  1 tablet Oral Daily  . omega-3 acid ethyl esters  1 g Oral BID  . [START ON 07/06/2018] pantoprazole  40 mg Intravenous Q12H  . potassium chloride  10 mEq Oral BID  . sertraline  25 mg Oral Daily  . vitamin B-12  500 mcg Oral Daily   Continuous Infusions: . sodium chloride 75 mL/hr at 07/05/18 0500  . pantoprozole (PROTONIX) infusion 8 mg/hr (07/05/18 0813)   PRN Meds:.acetaminophen, cycloSPORINE, dextromethorphan-guaiFENesin, diazepam, ondansetron **OR** ondansetron (ZOFRAN) IV, traMADol   Data Review:   Micro Results No results found for this or any previous visit (from the past  240 hour(s)).  Radiology Reports Dg Thoracic Spine 2 View  Result Date: 06/09/2018 CLINICAL DATA:  Found on floor in shower, fall, hit back of head and LEFT shoulder, LEFT shoulder pain EXAM: THORACIC SPINE 2 VIEWS COMPARISON:  None Correlation: Abdominal radiographs 10/07/2014 FINDINGS: Osseous demineralization. 13 pairs of ribs. Superior endplate compression fracture of a lower thoracic vertebra, likely T10, with 50% anterior height loss; this is not identified on prior abdominal radiographs. No additional fracture, dislocation, or bone destruction. IMPRESSION: Superior endplate compression fracture of approximately T10 vertebral body with 50% anterior height loss. Electronically Signed   By: Lavonia Dana M.D.   On: 06/09/2018 01:00   Ct Head Wo Contrast  Result Date: 07/03/2018 CLINICAL DATA:  Minor head trauma from a fall today. EXAM: CT HEAD WITHOUT CONTRAST TECHNIQUE: Contiguous axial images were obtained from the base of the skull through the vertex without intravenous contrast. COMPARISON:  06/08/2018. FINDINGS: Brain: Stable moderately enlarged ventricles and subarachnoid spaces. Stable moderate patchy white matter low density in both cerebral hemispheres. No intracranial hemorrhage, mass lesion or CT evidence of acute infarction. Vascular: No hyperdense vessel or unexpected calcification. Skull: Normal. Negative for fracture or focal lesion. Sinuses/Orbits: Mild right anterior ethmoid sinus mucosal thickening. The included orbits are unremarkable. Other: None. IMPRESSION: 1. No acute abnormality. 2. Stable atrophy and chronic small vessel white matter ischemic changes. Electronically Signed   By: Claudie Revering M.D.   On: 07/03/2018 09:06   Ct Head Wo Contrast  Result Date: 06/09/2018 CLINICAL DATA:  Unwitnessed fall in the shower. Struck back of head. EXAM: CT HEAD WITHOUT CONTRAST CT CERVICAL SPINE WITHOUT CONTRAST TECHNIQUE: Multidetector CT imaging of the head and cervical spine was performed  following the standard protocol without intravenous contrast. Multiplanar CT image reconstructions of the cervical spine were also generated. COMPARISON:  Cervical spine CT 12/04/2014, head CT 10/07/2013 FINDINGS: CT HEAD FINDINGS Brain: No intracranial hemorrhage, mass effect, or midline shift. Unchanged degree of atrophy and chronic small vessel ischemia. Unchanged 7 mm slightly hyperattenuating subependymal nodule in the lateral body of the left lateral ventricle. No hydrocephalus.  The basilar cisterns are patent. No evidence of territorial infarct or acute ischemia. No extra-axial or intracranial fluid collection. Vascular: Atherosclerosis of skullbase vasculature without hyperdense vessel or abnormal calcification. Skull: No fracture or focal lesion. Sinuses/Orbits: No acute finding.  Prior right cataract resection. Other: None. CT CERVICAL SPINE FINDINGS Alignment: Unchanged from prior exam with mild anterolisthesis of C4 on C5, C5 on C6, C6 on C7, likely facet mediated. Exaggerated cervical lordosis. No traumatic subluxation. Skull base and vertebrae: No acute fracture. The dens and skull base are intact. Soft tissues and spinal canal: No prevertebral fluid or swelling. No visible canal hematoma. Disc levels: Diffuse disc space narrowing and endplate spurring. Slight progression of degenerative disc disease at C6-C7 since prior exam. Multilevel facet hypertrophy. Upper chest: Negative. Other: Carotid calcifications. IMPRESSION: 1.  No acute intracranial abnormality.  No skull fracture. 2. Multilevel degenerative change throughout the cervical spine without acute fracture. Electronically Signed   By: Keith Rake M.D.   On: 06/09/2018 00:18   Ct Cervical Spine Wo Contrast  Result Date: 06/09/2018 CLINICAL DATA:  Unwitnessed fall in the shower. Struck back of head. EXAM: CT HEAD WITHOUT CONTRAST CT CERVICAL SPINE WITHOUT CONTRAST TECHNIQUE: Multidetector CT imaging of the head and cervical spine was  performed following the standard protocol without intravenous contrast. Multiplanar CT image reconstructions of the cervical spine were also generated. COMPARISON:  Cervical spine CT 12/04/2014, head CT 10/07/2013 FINDINGS: CT HEAD FINDINGS Brain: No intracranial hemorrhage, mass effect, or midline shift. Unchanged degree of atrophy and chronic small vessel ischemia. Unchanged 7 mm slightly hyperattenuating subependymal nodule in the lateral body of the left lateral ventricle. No hydrocephalus. The basilar cisterns are patent. No evidence of territorial infarct or acute ischemia. No extra-axial or intracranial fluid collection. Vascular: Atherosclerosis of skullbase vasculature without hyperdense vessel or abnormal calcification. Skull: No fracture or focal lesion. Sinuses/Orbits: No acute finding.  Prior right cataract resection. Other: None. CT CERVICAL SPINE FINDINGS Alignment: Unchanged from prior exam with mild anterolisthesis of C4 on C5, C5 on C6, C6 on C7, likely facet mediated. Exaggerated cervical lordosis. No traumatic subluxation. Skull base and vertebrae: No acute fracture. The dens and skull base are intact. Soft tissues and spinal canal: No prevertebral fluid or swelling. No visible canal hematoma. Disc levels: Diffuse disc space narrowing and endplate spurring. Slight progression of degenerative disc disease at C6-C7 since prior exam. Multilevel facet hypertrophy. Upper chest: Negative. Other: Carotid calcifications. IMPRESSION: 1.  No acute intracranial abnormality.  No skull fracture. 2. Multilevel degenerative change throughout the cervical spine without acute fracture. Electronically Signed   By: Keith Rake M.D.   On: 06/09/2018 00:18   Dg Shoulder Left  Result Date: 06/09/2018 CLINICAL DATA:  Found on floor in shower, fall, LEFT shoulder pain EXAM: LEFT SHOULDER - 2+ VIEW COMPARISON:  None FINDINGS: Diffuse osseous demineralization. AC joint alignment normal. Displaced fracture at  surgical neck LEFT humerus. No dislocation. No additional fractures identified. IMPRESSION: Displaced fracture at surgical neck of LEFT humerus. Electronically Signed   By: Lavonia Dana M.D.   On: 06/09/2018 00:57     CBC Recent Labs  Lab 07/03/18 0756  07/04/18 0416 07/04/18 0902 07/04/18 1520 07/04/18 2254 07/05/18 0426  WBC 9.9  --  6.6  --   --   --  7.4  HGB 6.8*   < > 7.8* 8.5* 9.7* 9.8* 9.2*  HCT 20.9*  --  23.1*  --  27.1* 28.9* 27.1*  PLT 187  --  102*  --   --   --  201  MCV 87.3  --  88.1  --   --   --  90.0  MCH 28.5  --  29.6  --   --   --  30.5  MCHC 32.6  --  33.6  --   --   --  33.9  RDW 21.0*  --  19.9*  --   --   --  19.4*   < > = values in this interval not displayed.    Chemistries  Recent Labs  Lab 07/03/18 0756 07/04/18 0416 07/05/18 0426  NA 138 139 140  K 4.0 3.7 3.2*  CL 106 112* 113*  CO2 23 24 22   GLUCOSE 109* 108* 89  BUN 42* 35* 15  CREATININE 0.74 0.68 0.71  CALCIUM 8.1* 7.6* 7.6*   ------------------------------------------------------------------------------------------------------------------ estimated creatinine clearance is 35 mL/min (by C-G formula based on SCr of 0.71 mg/dL). ------------------------------------------------------------------------------------------------------------------ No results for input(s): HGBA1C in the last 72 hours. ------------------------------------------------------------------------------------------------------------------ No results for input(s): CHOL, HDL, LDLCALC, TRIG, CHOLHDL, LDLDIRECT in the last 72 hours. ------------------------------------------------------------------------------------------------------------------ No results for input(s): TSH, T4TOTAL, T3FREE, THYROIDAB in the last 72 hours.  Invalid input(s): FREET3 ------------------------------------------------------------------------------------------------------------------ No results for input(s): VITAMINB12, FOLATE, FERRITIN, TIBC,  IRON, RETICCTPCT in the last 72 hours.  Coagulation profile Recent Labs  Lab 07/03/18 0923  INR 1.13    No results for input(s): DDIMER in the last 72 hours.  Cardiac Enzymes No results for input(s): CKMB, TROPONINI, MYOGLOBIN in the last 168 hours.  Invalid input(s): CK ------------------------------------------------------------------------------------------------------------------ Invalid input(s): [place]   Patient is 84 year old being admitted with dark tarry stools and drop in hemoglobin  1.  Upper GI bleed  State transfusion of one unit of PRCS Bleeding scan if able to do  GI seeing patient now, egd in morining   2.Chronic diarrhea I due to GI bleed I will hold her current regimen   3.  Essential hypertension blood pressure currently stable continue home regimen we will hold if needed   4.History of Crohn's disease with GI bleed I will hold her current medication   5.  Glaucoma continue eyedrops  6.  Miscellaneous DVT prophylaxis with SCDs All the records are reviewed and case discussed with ED provider      Code Status Orders  (From admission, onward)         Start     Ordered   07/03/18 1241  Do not attempt resuscitation (DNR)  Continuous    Question Answer Comment  In the event of cardiac or respiratory ARREST Do not call a "code blue"   In the event of cardiac or respiratory ARREST Do not perform Intubation, CPR, defibrillation or ACLS   In the event of cardiac or respiratory ARREST Use medication by any route, position, wound care, and other measures to relive pain and suffering. May use oxygen, suction and manual treatment of airway obstruction as needed for comfort.      07/03/18 1240        Code Status History    Date Active Date Inactive Code Status Order ID Comments User Context   06/19/2018 1710 06/22/2018 1643 DNR [id number]  Dustin Flock, MD Inpatient   06/09/2018 0411 06/12/2018 1715 DNR [id number]   Harrie Foreman, MD Inpatient   06/07/2018 0105 06/07/2018 2054 DNR [id number]  Lance Coon, MD Inpatient   03/25/2017 0305 03/27/2017 1830 DNR [id number]  Saundra Shelling, MD Inpatient  03/25/2017 0205 03/25/2017 0305 Full Code [id number]  Hugelmeyer, Ubaldo Glassing, DO ED    Advance Directive Documentation     Most Recent Value  Type of Advance Directive  Living will  Pre-existing out of facility DNR order (yellow form or pink MOST form)  -  "MOST" Form in Place?  -           Consults  ana  DVT Prophylaxis  scd's  Lab Results  Component Value Date   PLT 201 07/05/2018     Time Spent in minutes 61mn additional time spent 3:00 pm to 345 pm.   SDustin FlockM.D on 07/05/2018 at 9:41 AM  Between 7am to 6pm - Pager - [phone]  After 6pm go to [email or web address] - [email or web address]   Office  [phone]

## 2018-07-05 NOTE — Anesthesia Preprocedure Evaluation (Addendum)
Anesthesia Evaluation  Patient identified by MRN, date of birth, ID band Patient awake    Reviewed: Allergy & Precautions, H&P , NPO status , Patient's Chart, lab work & pertinent test results  Airway Mallampati: III  TM Distance: >3 FB    Comment: Very dry lips, tongue Dental   Pulmonary neg COPD, former smoker,           Cardiovascular hypertension, (-) Past MI      Neuro/Psych PSYCHIATRIC DISORDERS Anxiety negative neurological ROS  negative psych ROS   GI/Hepatic negative GI ROS, Neg liver ROS,   Endo/Other  negative endocrine ROS  Renal/GU ARFRenal diseasenegative Renal ROS  negative genitourinary   Musculoskeletal  (+) Arthritis ,   Abdominal   Peds  Hematology negative hematology ROS (+)   Anesthesia Other Findings Past Medical History: No date: Arthritis No date: Cataract No date: Crohn's No date: Deviated septum No date: Eczema No date: H/O seasonal allergies No date: History of hemorrhoids No date: History of shingles No date: Hypertension No date: Psoriasis No date: Vitamin B12 deficiency  Past Surgical History: No date: ABDOMINAL HYSTERECTOMY No date: BACK SURGERY No date: basel cell carcinoma No date: CATARACT EXTRACTION     Comment:  right eye No date: CHOLECYSTECTOMY No date: CHOLECYSTECTOMY No date: EYE SURGERY No date: HYSTERECTOMY No date: ILEOCECOSTOMY No date: MOHS SURGERY No date: NASAL SEPTUM SURGERY 2004: NECK SURGERY     Comment:  mass removed from cervical spine No date: NOSE SURGERY     Comment:  basal cell carcinoma of nose  BMI    Body Mass Index:  19.94 kg/m      Reproductive/Obstetrics negative OB ROS                            Anesthesia Physical Anesthesia Plan  ASA: III  Anesthesia Plan: General   Post-op Pain Management:    Induction:   PONV Risk Score and Plan: Propofol infusion and TIVA  Airway Management Planned:    Additional Equipment:   Intra-op Plan:   Post-operative Plan:   Informed Consent: I have reviewed the patients History and Physical, chart, labs and discussed the procedure including the risks, benefits and alternatives for the proposed anesthesia with the patient or authorized representative who has indicated his/her understanding and acceptance.   Dental Advisory Given  Plan Discussed with: Anesthesiologist, CRNA and Surgeon  Anesthesia Plan Comments:         Anesthesia Quick Evaluation

## 2018-07-05 NOTE — Progress Notes (Signed)
Sabrina Antigua, MD [address] [place], [place], [place], [address] [address], [place], [place], [place], [address] Phone: [phone]  Fax: [phone]   Subjective: Patient denies any abdominal pain at this time.  No nausea or vomiting.  Had an episode of melena yesterday.   Objective: Exam: Vital signs in last 24 hours: Vitals:   07/04/18 1844 07/04/18 1909 07/04/18 2141 07/05/18 0526  BP: 129/77 (!) 89/69 129/76 113/82  Pulse: 95 94 94 95  Resp: 16 17 20 16   Temp: 98.5 F (36.9 C)  98.3 F (36.8 C) 97.9 F (36.6 C)  TempSrc: Oral  Oral Oral  SpO2: 96% 96% 97% 95%  Weight:      Height:       Weight change:   Intake/Output Summary (Last 24 hours) at 07/05/2018 1250 Last data filed at 07/05/2018 0500 Gross per 24 hour  Intake 2063.45 ml  Output -  Net 2063.45 ml    General: No acute distress, AAO x3 Abd: Soft, NT/ND, No HSM Skin: Warm, no rashes Neck: Supple, Trachea midline   Lab Results: Lab Results  Component Value Date   WBC 7.4 07/05/2018   HGB 9.2 (L) 07/05/2018   HCT 27.1 (L) 07/05/2018   MCV 90.0 07/05/2018   PLT 201 07/05/2018   Micro Results: No results found for this or any previous visit (from the past 240 hour(s)). Studies/Results: No results found. Medications:  Scheduled Meds: . sodium chloride   Intravenous Once  . acetaminophen  500 mg Oral BID  . amLODipine  5 mg Oral BID  . cycloSPORINE  1 drop Both Eyes BID  . latanoprost  1 drop Both Eyes QHS  . Melatonin  5 mg Oral QHS  . multivitamin with minerals  1 tablet Oral Daily  . omega-3 acid ethyl esters  1 g Oral BID  . [START ON 07/06/2018] pantoprazole  40 mg Intravenous Q12H  . potassium chloride  10 mEq Oral BID  . sertraline  25 mg Oral Daily  . vitamin B-12  500 mcg Oral Daily   Continuous Infusions: . sodium chloride 75 mL/hr at 07/05/18 0500  . pantoprozole (PROTONIX) infusion 8 mg/hr (07/05/18 0813)   PRN Meds:.acetaminophen, cycloSPORINE,  dextromethorphan-guaiFENesin, diazepam, ondansetron **OR** ondansetron (ZOFRAN) IV, traMADol   Assessment: Active Problems:   Upper GI bleed    Plan: Patient evaluated by Dr. Vicente Males over the weekend Plan is to proceed with EGD today This was discussed with the patient and she is agreeable EGD is to evaluate for any upper sources of GI bleed including peptic ulcer disease esophagitis or malignancy  I have discussed alternative options, risks & benefits,  which include, but are not limited to, bleeding, infection, perforation,respiratory complication & drug reaction.  The patient agrees with this plan & written consent will be obtained.    PPI IV twice daily  Continue serial CBCs and transfuse PRN Avoid NSAIDs Maintain 2 large-bore IV lines Please page GI with any acute hemodynamic changes, or signs of active GI bleeding  Patient had a surgical neck fracture of the left proximal humerus a month ago.  Dr. Roland Rack from orthopedics has reviewed this and given recommendations as far as the EGD is concerned.  Please see his note.  We will try to do the EGD on the patient's back to avoid any strain on the left side.  If needed, we can turn her to her left side with the instructions recommended by Dr. Roland Rack     LOS:  2 days   Sabrina Antigua, MD 07/05/2018, 12:50 PM

## 2018-07-05 NOTE — Anesthesia Post-op Follow-up Note (Signed)
Anesthesia QCDR form completed.        

## 2018-07-06 DIAGNOSIS — F39 Unspecified mood [affective] disorder: Secondary | ICD-10-CM | POA: Diagnosis not present

## 2018-07-06 DIAGNOSIS — K922 Gastrointestinal hemorrhage, unspecified: Secondary | ICD-10-CM | POA: Diagnosis not present

## 2018-07-06 DIAGNOSIS — S42302A Unspecified fracture of shaft of humerus, left arm, initial encounter for closed fracture: Secondary | ICD-10-CM | POA: Diagnosis not present

## 2018-07-06 DIAGNOSIS — I10 Essential (primary) hypertension: Secondary | ICD-10-CM | POA: Diagnosis not present

## 2018-07-06 LAB — TYPE AND SCREEN
ABO/RH(D): O POS
Antibody Screen: NEGATIVE
UNIT DIVISION: 0
UNIT DIVISION: 0
Unit division: 0
Unit division: 0

## 2018-07-06 LAB — CBC WITH DIFFERENTIAL/PLATELET
ABS IMMATURE GRANULOCYTES: 0.02 10*3/uL (ref 0.00–0.07)
BASOS PCT: 0 %
Basophils Absolute: 0 10*3/uL (ref 0.0–0.1)
EOS PCT: 3 %
Eosinophils Absolute: 0.3 10*3/uL (ref 0.0–0.5)
HCT: 27.1 % — ABNORMAL LOW (ref 36.0–46.0)
Hemoglobin: 8.9 g/dL — ABNORMAL LOW (ref 12.0–15.0)
Immature Granulocytes: 0 %
Lymphocytes Relative: 9 %
Lymphs Abs: 0.8 10*3/uL (ref 0.7–4.0)
MCH: 30.1 pg (ref 26.0–34.0)
MCHC: 32.8 g/dL (ref 30.0–36.0)
MCV: 91.6 fL (ref 80.0–100.0)
MONO ABS: 0.7 10*3/uL (ref 0.1–1.0)
MONOS PCT: 8 %
Neutro Abs: 6.5 10*3/uL (ref 1.7–7.7)
Neutrophils Relative %: 80 %
PLATELETS: 261 10*3/uL (ref 150–400)
RBC: 2.96 MIL/uL — AB (ref 3.87–5.11)
RDW: 19.6 % — ABNORMAL HIGH (ref 11.5–15.5)
WBC: 8.2 10*3/uL (ref 4.0–10.5)
nRBC: 0 % (ref 0.0–0.2)

## 2018-07-06 LAB — BPAM RBC
BLOOD PRODUCT EXPIRATION DATE: 201910282359
Blood Product Expiration Date: 201910282359
Blood Product Expiration Date: 201911052359
Blood Product Expiration Date: 201911052359
ISSUE DATE / TIME: 201910050938
ISSUE DATE / TIME: 201910061908
ISSUE DATE / TIME: 201910050938
ISSUE DATE / TIME: 201910061053
UNIT TYPE AND RH: 5100
UNIT TYPE AND RH: 5100
UNIT TYPE AND RH: 5100
Unit Type and Rh: 5100

## 2018-07-06 MED ORDER — ZINC OXIDE 40 % EX OINT
TOPICAL_OINTMENT | Freq: Three times a day (TID) | CUTANEOUS | Status: DC
  Administered 2018-07-06: 12:00:00 via TOPICAL
  Filled 2018-07-06: qty 113

## 2018-07-06 MED ORDER — MORPHINE SULFATE 20 MG/5ML PO SOLN: 2.5000 mg | ORAL | 0 refills | Status: AC | PRN

## 2018-07-06 NOTE — Discharge Summary (Signed)
Sabrina Hall at [facility], 84 y.o., DOB [birth year], MRN [id number]. Admission date: 07/03/2018 Discharge Date 07/06/2018 Primary MD Venia Carbon, MD Admitting Physician Dustin Flock, MD  Admission Diagnosis  UGIB (upper gastrointestinal bleed) [K92.2] Fall, initial encounter [W19.XXXA] Acute on chronic blood loss anemia [D62]  Discharge Diagnosis   Active Problems: GI bleed likely lower been ruled Acute blood loss anemia due to GI bleed Chronic diarrhea Essential hypertension History of crohns disease Glaucoma      Hospital Course Patient is 84 year old who presented with dark tarry stools.  Patient initially was thought to have upper GI bleed.  Patient received multiple units of packed RBCs continue to have bleeding.  She was seen by GI and underwent EGD which did not show source of bleeding.  The patient and family did not want any further interventions done based on her age.  After multiple discussions they have decided on patient going back to her facility with assisted living with hospice following her there.            Consults  GI  Significant Tests:  See full reports for all details     Dg Thoracic Spine 2 View  Result Date: 06/09/2018 CLINICAL DATA:  Found on floor in shower, fall, hit back of head and LEFT shoulder, LEFT shoulder pain EXAM: THORACIC SPINE 2 VIEWS COMPARISON:  None Correlation: Abdominal radiographs 10/07/2014 FINDINGS: Osseous demineralization. 13 pairs of ribs. Superior endplate compression fracture of a lower thoracic vertebra, likely T10, with 50% anterior height loss; this is not identified on prior abdominal radiographs. No additional fracture, dislocation, or bone destruction. IMPRESSION: Superior endplate compression fracture of approximately T10 vertebral body with 50% anterior height loss. Electronically Signed   By: Lavonia Dana M.D.   On: 06/09/2018 01:00   Ct Head Wo Contrast  Result  Date: 07/03/2018 CLINICAL DATA:  Minor head trauma from a fall today. EXAM: CT HEAD WITHOUT CONTRAST TECHNIQUE: Contiguous axial images were obtained from the base of the skull through the vertex without intravenous contrast. COMPARISON:  06/08/2018. FINDINGS: Brain: Stable moderately enlarged ventricles and subarachnoid spaces. Stable moderate patchy white matter low density in both cerebral hemispheres. No intracranial hemorrhage, mass lesion or CT evidence of acute infarction. Vascular: No hyperdense vessel or unexpected calcification. Skull: Normal. Negative for fracture or focal lesion. Sinuses/Orbits: Mild right anterior ethmoid sinus mucosal thickening. The included orbits are unremarkable. Other: None. IMPRESSION: 1. No acute abnormality. 2. Stable atrophy and chronic small vessel white matter ischemic changes. Electronically Signed   By: Claudie Revering M.D.   On: 07/03/2018 09:06   Ct Head Wo Contrast  Result Date: 06/09/2018 CLINICAL DATA:  Unwitnessed fall in the shower. Struck back of head. EXAM: CT HEAD WITHOUT CONTRAST CT CERVICAL SPINE WITHOUT CONTRAST TECHNIQUE: Multidetector CT imaging of the head and cervical spine was performed following the standard protocol without intravenous contrast. Multiplanar CT image reconstructions of the cervical spine were also generated. COMPARISON:  Cervical spine CT 12/04/2014, head CT 10/07/2013 FINDINGS: CT HEAD FINDINGS Brain: No intracranial hemorrhage, mass effect, or midline shift. Unchanged degree of atrophy and chronic small vessel ischemia. Unchanged 7 mm slightly hyperattenuating subependymal nodule in the lateral body of the left lateral ventricle. No hydrocephalus. The basilar cisterns are patent. No evidence of territorial infarct or acute ischemia. No extra-axial or intracranial fluid collection. Vascular: Atherosclerosis of skullbase vasculature without hyperdense vessel or abnormal calcification. Skull: No fracture or focal lesion. Sinuses/Orbits:  No  acute finding.  Prior right cataract resection. Other: None. CT CERVICAL SPINE FINDINGS Alignment: Unchanged from prior exam with mild anterolisthesis of C4 on C5, C5 on C6, C6 on C7, likely facet mediated. Exaggerated cervical lordosis. No traumatic subluxation. Skull base and vertebrae: No acute fracture. The dens and skull base are intact. Soft tissues and spinal canal: No prevertebral fluid or swelling. No visible canal hematoma. Disc levels: Diffuse disc space narrowing and endplate spurring. Slight progression of degenerative disc disease at C6-C7 since prior exam. Multilevel facet hypertrophy. Upper chest: Negative. Other: Carotid calcifications. IMPRESSION: 1.  No acute intracranial abnormality.  No skull fracture. 2. Multilevel degenerative change throughout the cervical spine without acute fracture. Electronically Signed   By: Keith Rake M.D.   On: 06/09/2018 00:18   Ct Cervical Spine Wo Contrast  Result Date: 06/09/2018 CLINICAL DATA:  Unwitnessed fall in the shower. Struck back of head. EXAM: CT HEAD WITHOUT CONTRAST CT CERVICAL SPINE WITHOUT CONTRAST TECHNIQUE: Multidetector CT imaging of the head and cervical spine was performed following the standard protocol without intravenous contrast. Multiplanar CT image reconstructions of the cervical spine were also generated. COMPARISON:  Cervical spine CT 12/04/2014, head CT 10/07/2013 FINDINGS: CT HEAD FINDINGS Brain: No intracranial hemorrhage, mass effect, or midline shift. Unchanged degree of atrophy and chronic small vessel ischemia. Unchanged 7 mm slightly hyperattenuating subependymal nodule in the lateral body of the left lateral ventricle. No hydrocephalus. The basilar cisterns are patent. No evidence of territorial infarct or acute ischemia. No extra-axial or intracranial fluid collection. Vascular: Atherosclerosis of skullbase vasculature without hyperdense vessel or abnormal calcification. Skull: No fracture or focal lesion.  Sinuses/Orbits: No acute finding.  Prior right cataract resection. Other: None. CT CERVICAL SPINE FINDINGS Alignment: Unchanged from prior exam with mild anterolisthesis of C4 on C5, C5 on C6, C6 on C7, likely facet mediated. Exaggerated cervical lordosis. No traumatic subluxation. Skull base and vertebrae: No acute fracture. The dens and skull base are intact. Soft tissues and spinal canal: No prevertebral fluid or swelling. No visible canal hematoma. Disc levels: Diffuse disc space narrowing and endplate spurring. Slight progression of degenerative disc disease at C6-C7 since prior exam. Multilevel facet hypertrophy. Upper chest: Negative. Other: Carotid calcifications. IMPRESSION: 1.  No acute intracranial abnormality.  No skull fracture. 2. Multilevel degenerative change throughout the cervical spine without acute fracture. Electronically Signed   By: Keith Rake M.D.   On: 06/09/2018 00:18   Dg Shoulder Left  Result Date: 06/09/2018 CLINICAL DATA:  Found on floor in shower, fall, LEFT shoulder pain EXAM: LEFT SHOULDER - 2+ VIEW COMPARISON:  None FINDINGS: Diffuse osseous demineralization. AC joint alignment normal. Displaced fracture at surgical neck LEFT humerus. No dislocation. No additional fractures identified. IMPRESSION: Displaced fracture at surgical neck of LEFT humerus. Electronically Signed   By: Lavonia Dana M.D.   On: 06/09/2018 00:57   Dg Shoulder Left Port  Result Date: 07/05/2018 CLINICAL DATA:  Proximal left humeral fracture, follow-up exam EXAM: LEFT SHOULDER - 1 VIEW COMPARISON:  06/09/2018 FINDINGS: There is a fracture of the proximal left humerus involving the surgical neck. The overall appearance is stable from the prior exam. Some fragmentation extending into the humeral head is noted although no significant displacement is seen. No dislocation is noted. The underlying bony thorax is within normal limits. IMPRESSION: Relatively stable appearance of the known proximal left  humeral fracture. Electronically Signed   By: Inez Catalina M.D.   On: 07/05/2018 18:55  Today   Subjective:   Sabrina Hall patient continued to have bleeding  Objective:   Blood pressure (!) 147/64, pulse (!) 102, temperature 98.4 F (36.9 C), temperature source Oral, resp. rate 18, height 5' 2"  (1.575 m), weight 49.4 kg, SpO2 97 %.  .  Intake/Output Summary (Last 24 hours) at 07/06/2018 1157 Last data filed at 07/05/2018 1329 Gross per 24 hour  Intake 100 ml  Output -  Net 100 ml    Exam VITAL SIGNS: Blood pressure (!) 147/64, pulse (!) 102, temperature 98.4 F (36.9 C), temperature source Oral, resp. rate 18, height 5' 2"  (1.575 m), weight 49.4 kg, SpO2 97 %.  GENERAL:  83 y.o.-year-old patient lying in the bed with no acute distress.  EYES: Pupils equal, round, reactive to light and accommodation. No scleral icterus. Extraocular muscles intact.  HEENT: Head atraumatic, normocephalic. Oropharynx and nasopharynx clear.  NECK:  Supple, no jugular venous distention. No thyroid enlargement, no tenderness.  LUNGS: Normal breath sounds bilaterally, no wheezing, rales,rhonchi or crepitation. No use of accessory muscles of respiration.  CARDIOVASCULAR: S1, S2 normal. No murmurs, rubs, or gallops.  ABDOMEN: Soft, nontender, nondistended. Bowel sounds present. No organomegaly or mass.  EXTREMITIES: No pedal edema, cyanosis, or clubbing.  NEUROLOGIC: Cranial nerves II through XII are intact. Muscle strength 5/5 in all extremities. Sensation intact. Gait not checked.  PSYCHIATRIC: The patient is alert and oriented x 3.  SKIN: No obvious rash, lesion, or ulcer.   Data Review     CBC w Diff:  Lab Results  Component Value Date   WBC 8.2 07/06/2018   HGB 8.9 (L) 07/06/2018   HGB 14.8 10/07/2014   HCT 27.1 (L) 07/06/2018   HCT 45.0 10/07/2014   PLT 261 07/06/2018   PLT 169 10/07/2014   LYMPHOPCT 9 07/06/2018   LYMPHOPCT 19.1 10/07/2014   MONOPCT 8 07/06/2018   MONOPCT  12.3 10/07/2014   EOSPCT 3 07/06/2018   EOSPCT 2.2 10/07/2014   BASOPCT 0 07/06/2018   BASOPCT 0.8 10/07/2014   CMP:  Lab Results  Component Value Date   NA 140 07/05/2018   NA 135 (L) 10/07/2014   K 3.2 (L) 07/05/2018   K 3.5 10/07/2014   CL 113 (H) 07/05/2018   CL 96 (L) 10/07/2014   CO2 22 07/05/2018   CO2 31 10/07/2014   BUN 15 07/05/2018   BUN 16 10/07/2014   CREATININE 0.71 07/05/2018   CREATININE 0.96 10/07/2014   PROT 6.7 06/19/2018   PROT 7.5 10/07/2014   ALBUMIN 3.4 (L) 06/19/2018   ALBUMIN 3.4 10/07/2014   BILITOT 0.7 06/19/2018   BILITOT 0.6 10/07/2014   ALKPHOS 70 06/19/2018   ALKPHOS 69 10/07/2014   AST 16 06/19/2018   AST 29 10/07/2014   ALT 16 06/19/2018   ALT 25 10/07/2014  .  Micro Results No results found for this or any previous visit (from the past 240 hour(s)).      Code Status Orders  (From admission, onward)         Start     Ordered   07/03/18 1241  Do not attempt resuscitation (DNR)  Continuous    Question Answer Comment  In the event of cardiac or respiratory ARREST Do not call a "code blue"   In the event of cardiac or respiratory ARREST Do not perform Intubation, CPR, defibrillation or ACLS   In the event of cardiac or respiratory ARREST Use medication by any route, position, wound care, and other measures to  relive pain and suffering. May use oxygen, suction and manual treatment of airway obstruction as needed for comfort.      07/03/18 1240        Code Status History    Date Active Date Inactive Code Status Order ID Comments User Context   06/19/2018 1710 06/22/2018 1643 DNR [id number]  Dustin Flock, MD Inpatient   06/09/2018 0411 06/12/2018 1715 DNR [id number]  Harrie Foreman, MD Inpatient   06/07/2018 0105 06/07/2018 2054 DNR [id number]  Lance Coon, MD Inpatient   03/25/2017 0305 03/27/2017 1830 DNR [id number]  Saundra Shelling, MD Inpatient   03/25/2017 0205 03/25/2017 0305 Full Code [id number]  Hugelmeyer, Ubaldo Glassing, DO ED     Advance Directive Documentation     Most Recent Value  Type of Advance Directive  Living will  Pre-existing out of facility DNR order (yellow form or pink MOST form)  -  "MOST" Form in Place?  -          Contact information for after-discharge care    Destination    [facility] .   Service:  Skilled Nursing Contact information: [address] [place] [phone]              Discharge Medications   Allergies as of 07/06/2018      Reactions   Lactose Dermatitis   Maxzide [hydrochlorothiazide W-triamterene] Other (See Comments)   causes electrolyte disturbance   Nsaids Other (See Comments)   Reaction: unknown   Sulfonamide Derivatives Other (See Comments)   Reaction: unknown   Penicillins Rash, Other (See Comments)   Reaction pulled from Care Everywhere Has patient had a PCN reaction causing immediate rash, facial/tongue/throat swelling, SOB or lightheadedness with hypotension: Unknown Has patient had a PCN reaction causing severe rash involving mucus membranes or skin necrosis: Unknown Has patient had a PCN reaction that required hospitalization: Unknown Has patient had a PCN reaction occurring within the last 10 years: Unknown If all of the above answers are "NO", then may proceed with Cephalosporin use.      Medication List    STOP taking these medications   alum & mag hydroxide-simeth 200-200-20 MG/5ML suspension Commonly known as:  MAALOX/MYLANTA   amLODipine 5 MG tablet Commonly known as:  NORVASC   aspirin 81 MG chewable tablet   balsalazide 750 MG capsule Commonly known as:  COLAZAL   bismuth subsalicylate 974 BU/38GT suspension Commonly known as:  PEPTO BISMOL   carbamide peroxide 6.5 % OTIC solution Commonly known as:  DEBROX   dextromethorphan-guaiFENesin 10-100 MG/5ML liquid Commonly known as:  ROBITUSSIN-DM   dicyclomine 10 MG capsule Commonly known as:  BENTYL   ferrous sulfate 325  (65 FE) MG EC tablet   fish oil-omega-3 fatty acids 1000 MG capsule   guaifenesin 100 MG/5ML syrup Commonly known as:  ROBITUSSIN   lactase 3000 units tablet Commonly known as:  LACTAID   loperamide 2 MG capsule Commonly known as:  IMODIUM   magnesium hydroxide 400 MG/5ML suspension Commonly known as:  MILK OF MAGNESIA   magnesium oxide 400 MG tablet Commonly known as:  MAG-OX   multivitamin tablet   ondansetron 4 MG tablet Commonly known as:  ZOFRAN   potassium chloride 10 MEQ tablet Commonly known as:  K-DUR   sertraline 25 MG tablet Commonly known as:  ZOLOFT   traMADol 50 MG tablet Commonly known as:  ULTRAM   Vitamin B-12 500 MCG Subl     TAKE these medications  acetaminophen 500 MG tablet Commonly known as:  TYLENOL Take 500 mg by mouth 2 (two) times daily.   acetaminophen 325 MG tablet Commonly known as:  TYLENOL Take 325-650 mg by mouth every 4 (four) hours as needed for fever (general discomfort).   cycloSPORINE 0.05 % ophthalmic emulsion Commonly known as:  RESTASIS Place 1 drop into both eyes 2 (two) times daily.   cycloSPORINE 0.05 % ophthalmic emulsion Commonly known as:  RESTASIS Place 1 drop into both eyes 2 (two) times daily as needed (dry eyes).   diazepam 5 MG tablet Commonly known as:  VALIUM Take 2.5 mg by mouth every 12 (twelve) hours as needed for anxiety.   latanoprost 0.005 % ophthalmic solution Commonly known as:  XALATAN Place 1 drop into both eyes at bedtime.   Melatonin 3 MG Tabs Take 1 tablet by mouth at bedtime.   morphine 20 MG/5ML solution Take 0.6 mLs (2.4 mg total) by mouth every 2 (two) hours as needed for up to 7 days for pain.   nystatin powder Generic drug:  nystatin Apply 1 g topically 2 (two) times daily as needed (rash).          Total Time in preparing paper work, data evaluation and todays exam - 85 minutes  Dustin Flock M.D on 07/06/2018 at 11:57 AM [place]  [phone]

## 2018-07-06 NOTE — Care Management Important Message (Signed)
Copy of signed IM left with patient in room.  

## 2018-07-06 NOTE — Anesthesia Postprocedure Evaluation (Signed)
Anesthesia Post Note  Patient: Sabrina Hall  Procedure(s) Performed: ESOPHAGOGASTRODUODENOSCOPY (EGD) WITH PROPOFOL (N/A )  Patient location during evaluation: PACU Anesthesia Type: General Level of consciousness: awake and alert Pain management: pain level controlled Vital Signs Assessment: post-procedure vital signs reviewed and stable Respiratory status: spontaneous breathing, nonlabored ventilation, respiratory function stable and patient connected to nasal cannula oxygen Cardiovascular status: blood pressure returned to baseline and stable Postop Assessment: no apparent nausea or vomiting Anesthetic complications: no     Last Vitals:  Vitals:   07/05/18 1428 07/05/18 2114  BP: 111/85 (!) 147/64  Pulse: 89 (!) 102  Resp:  18  Temp:  36.9 C  SpO2: 96% 97%    Last Pain:  Vitals:   07/05/18 2308  TempSrc:   PainSc: 0-No pain                 Durenda Hurt

## 2018-07-06 NOTE — Progress Notes (Signed)
Advanced care plan.  Purpose of the Encounter: goal of care Parties in Attendance: patient and d/w daughter over phone  Patient's Decision Capacity:intact  Subjective/Patient's story:  Pt is 84 y/o admittted with gi bleed continues to bleed, I d/w patient and daughter goal of care  Objective/Medical story I d/w patient poor prognosis , have decided to discharge pt back to [facility] with hospices services   Goals of care determination:  [facility] with hospices services   CODE STATUS:  dnr  Time spent discussing advanced care planning: 16 minutes

## 2018-07-06 NOTE — Progress Notes (Signed)
New referral for [facility] services at [facility] received from [place]. Patient is a 84 year old woman with a known history of Crohn's disease, Left shoulder fracture, HTN, arthritis, deviated septum, shingles, B12 deficiency, hemorrhoids, eczema and psoriasis admitted to [facility] on 10/5 from [facility] for evaluation of dark stools. She did receive several units of blood and GI was consulted. Upper endoscopy did not reveal any source of bleeding, family declined further work up and have chosen to have patient return to [facility] with the support of hospice services.  Patient seen lying in bed, cousin Mrs. Vanstory present. Patient able to converse and answer questions. She is now needing assistance with feeding and is dependent for ADL's, she is incontinent of bowel and bladder. Shoulder immobilizer in place to left shoulder from previous fall/admission. Last hemoglobin 8.2, was 7.4 yesterday. Appetite fair. PPS 40%. Plan is for discharge via EMS today. Contact numbers confirmed with CSW Shela Leff and Mrs. Vanstory. Patient information faxed to referral. No DME needed. Signed DNR in place in discharge packet. Hospital Care team updated. Patient information faxed to referral. Flo Shanks RN, BSN, [facility] [place], hospital liaison [phone]

## 2018-07-06 NOTE — Discharge Instructions (Signed)
Gastrointestinal Bleeding Gastrointestinal bleeding is bleeding somewhere along the path food travels through the body (digestive tract). This path is anywhere between the mouth and the opening of the butt (anus). You may have blood in your poop (stools) or have black poop. If you throw up (vomit), there may be blood in it. This condition can be mild, serious, or even life-threatening. If you have a lot of bleeding, you may need to stay in the hospital. Follow these instructions at home:  Take over-the-counter and prescription medicines only as told by your doctor.  Eat foods that have a lot of fiber in them. These foods include whole grains, fruits, and vegetables. You can also try eating 1-3 prunes each day.  Drink enough fluid to keep your pee (urine) clear or pale yellow.  Keep all follow-up visits as told by your doctor. This is important. Contact a doctor if:  Your symptoms do not get better. Get help right away if:  Your bleeding gets worse.  You feel dizzy or you pass out (faint).  You feel weak.  You have very bad cramps in your back or belly (abdomen).  You pass large clumps of blood (clots) in your poop.  Your symptoms are getting worse. This information is not intended to replace advice given to you by your health care provider. Make sure you discuss any questions you have with your health care provider. Document Released: 06/24/2008 Document Revised: 02/21/2016 Document Reviewed: 03/05/2015 Elsevier Interactive Patient Education  2018 [employer].

## 2018-08-11 DIAGNOSIS — I10 Essential (primary) hypertension: Secondary | ICD-10-CM

## 2018-08-11 DIAGNOSIS — S42302A Unspecified fracture of shaft of humerus, left arm, initial encounter for closed fracture: Secondary | ICD-10-CM

## 2018-08-11 DIAGNOSIS — K922 Gastrointestinal hemorrhage, unspecified: Secondary | ICD-10-CM

## 2018-08-11 DIAGNOSIS — F39 Unspecified mood [affective] disorder: Secondary | ICD-10-CM | POA: Diagnosis not present

## 2018-09-03 DIAGNOSIS — I10 Essential (primary) hypertension: Secondary | ICD-10-CM

## 2018-09-03 DIAGNOSIS — B351 Tinea unguium: Secondary | ICD-10-CM | POA: Diagnosis not present

## 2018-09-03 DIAGNOSIS — K922 Gastrointestinal hemorrhage, unspecified: Secondary | ICD-10-CM

## 2018-09-03 DIAGNOSIS — F39 Unspecified mood [affective] disorder: Secondary | ICD-10-CM

## 2018-09-03 DIAGNOSIS — S42302A Unspecified fracture of shaft of humerus, left arm, initial encounter for closed fracture: Secondary | ICD-10-CM

## 2018-10-12 DIAGNOSIS — S50811A Abrasion of right forearm, initial encounter: Secondary | ICD-10-CM | POA: Diagnosis not present

## 2018-11-05 DIAGNOSIS — B351 Tinea unguium: Secondary | ICD-10-CM

## 2018-11-15 DIAGNOSIS — I10 Essential (primary) hypertension: Secondary | ICD-10-CM | POA: Diagnosis not present

## 2018-11-15 DIAGNOSIS — E440 Moderate protein-calorie malnutrition: Secondary | ICD-10-CM | POA: Diagnosis not present

## 2018-11-15 DIAGNOSIS — F39 Unspecified mood [affective] disorder: Secondary | ICD-10-CM

## 2018-11-15 DIAGNOSIS — K5000 Crohn's disease of small intestine without complications: Secondary | ICD-10-CM | POA: Diagnosis not present

## 2019-01-14 ENCOUNTER — Telehealth: Payer: Self-pay

## 2019-01-14 DIAGNOSIS — E43 Unspecified severe protein-calorie malnutrition: Secondary | ICD-10-CM

## 2019-01-14 DIAGNOSIS — F39 Unspecified mood [affective] disorder: Secondary | ICD-10-CM

## 2019-01-14 DIAGNOSIS — I10 Essential (primary) hypertension: Secondary | ICD-10-CM

## 2019-01-14 DIAGNOSIS — K5000 Crohn's disease of small intestine without complications: Secondary | ICD-10-CM | POA: Diagnosis not present

## 2019-01-14 NOTE — Telephone Encounter (Signed)
Called patient from recall.  No answer. VM was full.  Trying to schedule an evisit

## 2019-01-28 NOTE — Telephone Encounter (Signed)
Called patient from recall.  No answer. VM was full.  This is the 2nd attempt.

## 2019-02-02 NOTE — Telephone Encounter (Signed)
Called patient from recall.  No answer.  Number is disconnected.  This is the 3rd attempt per recall list.  Will delete recall.

## 2019-03-10 DIAGNOSIS — K5090 Crohn's disease, unspecified, without complications: Secondary | ICD-10-CM

## 2019-03-10 DIAGNOSIS — E441 Mild protein-calorie malnutrition: Secondary | ICD-10-CM

## 2019-03-10 DIAGNOSIS — F39 Unspecified mood [affective] disorder: Secondary | ICD-10-CM

## 2019-03-10 DIAGNOSIS — I10 Essential (primary) hypertension: Secondary | ICD-10-CM | POA: Diagnosis not present

## 2019-03-22 DIAGNOSIS — F39 Unspecified mood [affective] disorder: Secondary | ICD-10-CM

## 2019-05-06 DIAGNOSIS — F39 Unspecified mood [affective] disorder: Secondary | ICD-10-CM

## 2019-05-06 DIAGNOSIS — I10 Essential (primary) hypertension: Secondary | ICD-10-CM

## 2019-05-06 DIAGNOSIS — K5090 Crohn's disease, unspecified, without complications: Secondary | ICD-10-CM

## 2019-05-06 DIAGNOSIS — E43 Unspecified severe protein-calorie malnutrition: Secondary | ICD-10-CM

## 2019-05-06 DIAGNOSIS — M1990 Unspecified osteoarthritis, unspecified site: Secondary | ICD-10-CM

## 2019-06-02 DIAGNOSIS — B351 Tinea unguium: Secondary | ICD-10-CM | POA: Diagnosis not present

## 2019-07-10 IMAGING — CR DG SHOULDER 2+V*L*
2 series · 2 of 2 positions shown · non-contrast
Comparison: None

CLINICAL DATA: Found on floor in shower, fall, LEFT shoulder pain

EXAM:
LEFT SHOULDER - 2+ VIEW

[shoulder grashey]
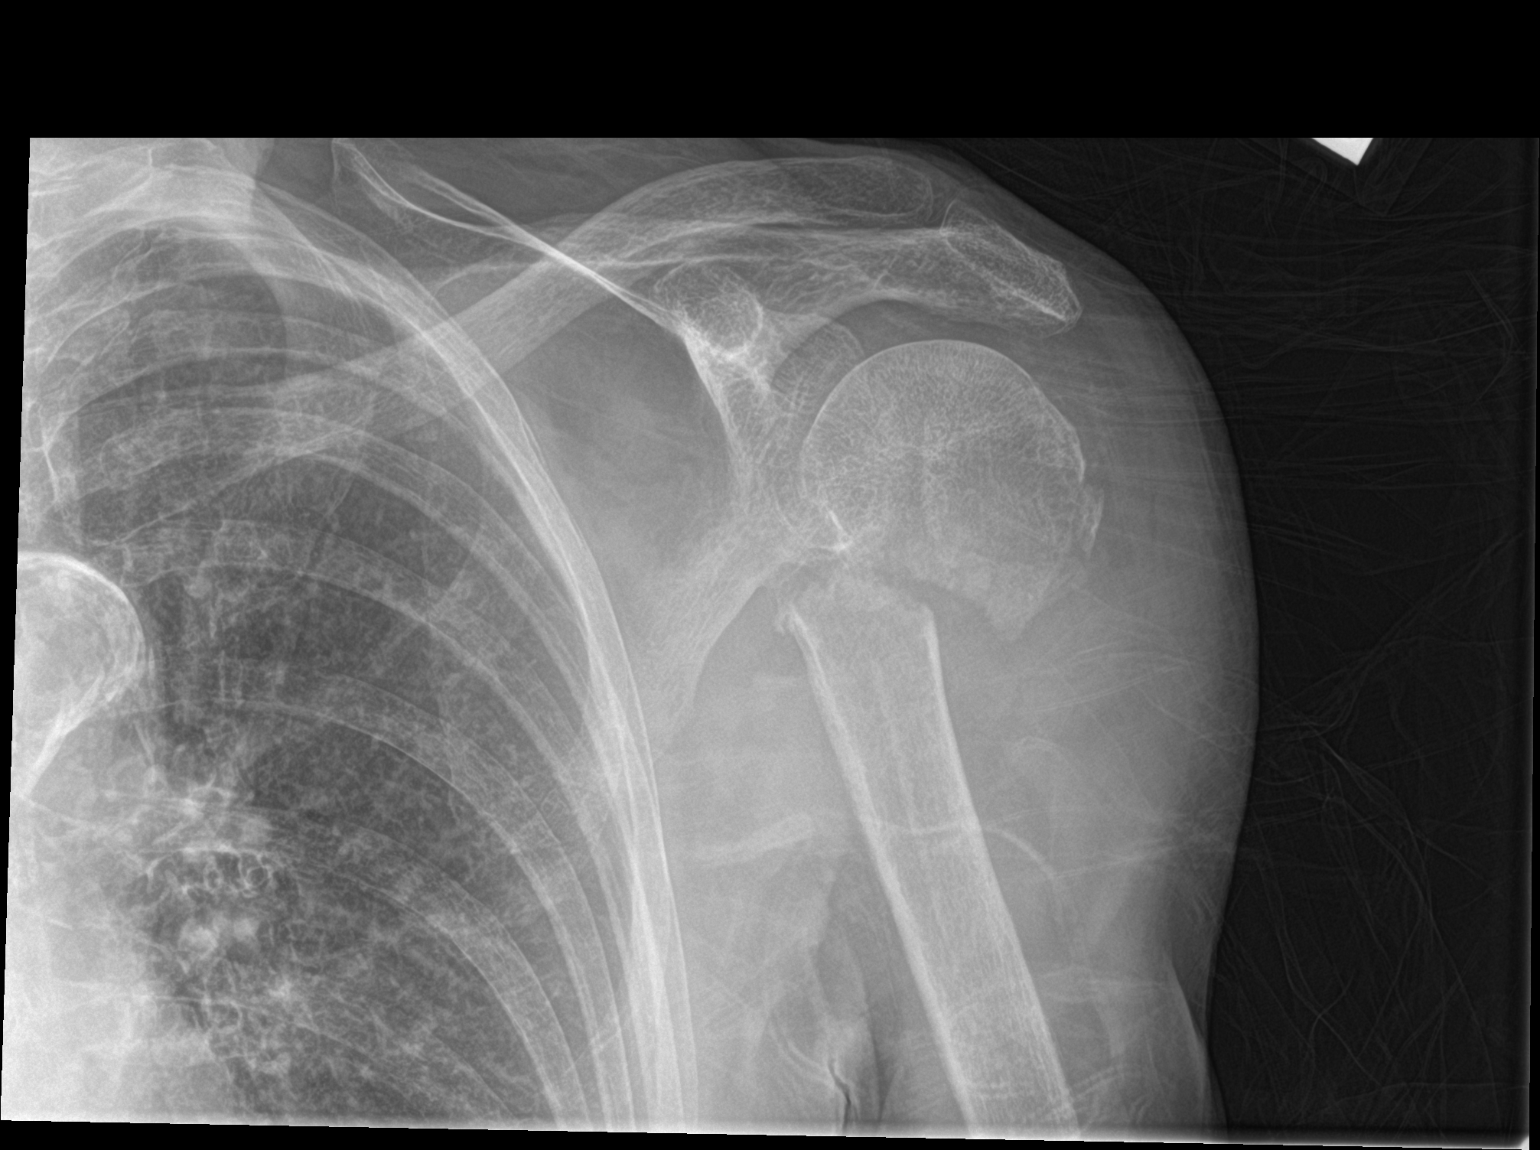

[shoulder y view]
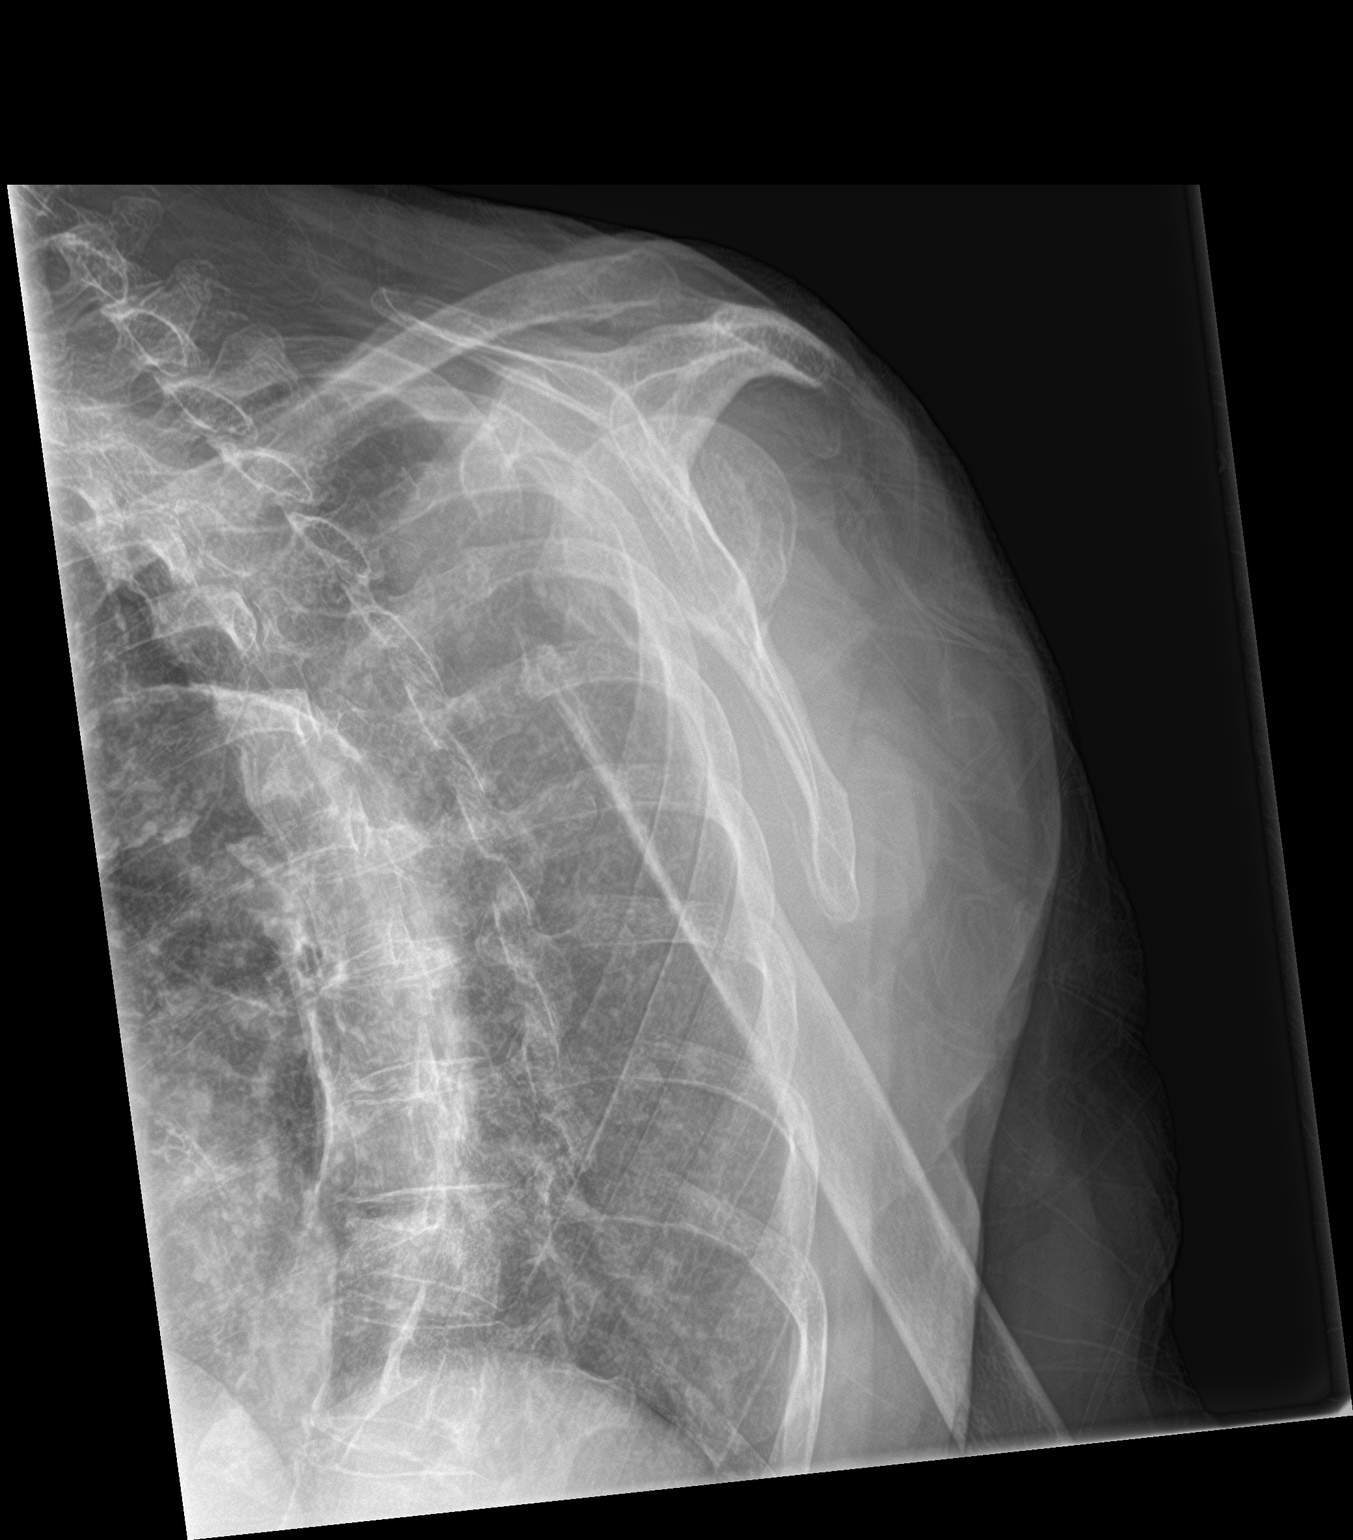

[2 of 2 positions shown; findings below may reference images not displayed]

FINDINGS: Diffuse osseous demineralization.

AC joint alignment normal.

Displaced fracture at surgical neck LEFT humerus.

No dislocation.

No additional fractures identified.
IMPRESSION: Displaced fracture at surgical neck of LEFT humerus.

## 2019-07-19 DIAGNOSIS — I10 Essential (primary) hypertension: Secondary | ICD-10-CM

## 2019-07-19 DIAGNOSIS — K5090 Crohn's disease, unspecified, without complications: Secondary | ICD-10-CM

## 2019-07-19 DIAGNOSIS — E441 Mild protein-calorie malnutrition: Secondary | ICD-10-CM | POA: Diagnosis not present

## 2019-07-19 DIAGNOSIS — M159 Polyosteoarthritis, unspecified: Secondary | ICD-10-CM

## 2019-08-08 DIAGNOSIS — R197 Diarrhea, unspecified: Secondary | ICD-10-CM | POA: Diagnosis not present

## 2019-08-30 DEATH — deceased
